# Patient Record
Sex: Male | Born: 1950 | Race: White | Hispanic: No | Smoking: Never smoker
Health system: Southern US, Community
[De-identification: ages and names within clinical notes are randomized; demographics above are authoritative.]

## PROBLEM LIST (undated history)

## (undated) DIAGNOSIS — I6529 Occlusion and stenosis of unspecified carotid artery: Secondary | ICD-10-CM

## (undated) DIAGNOSIS — C801 Malignant (primary) neoplasm, unspecified: Secondary | ICD-10-CM

## (undated) DIAGNOSIS — E785 Hyperlipidemia, unspecified: Secondary | ICD-10-CM

## (undated) DIAGNOSIS — C229 Malignant neoplasm of liver, not specified as primary or secondary: Secondary | ICD-10-CM

## (undated) DIAGNOSIS — M1990 Unspecified osteoarthritis, unspecified site: Secondary | ICD-10-CM

## (undated) DIAGNOSIS — K429 Umbilical hernia without obstruction or gangrene: Secondary | ICD-10-CM

## (undated) DIAGNOSIS — Z923 Personal history of irradiation: Secondary | ICD-10-CM

## (undated) DIAGNOSIS — I10 Essential (primary) hypertension: Secondary | ICD-10-CM

## (undated) DIAGNOSIS — E079 Disorder of thyroid, unspecified: Secondary | ICD-10-CM

## (undated) DIAGNOSIS — R011 Cardiac murmur, unspecified: Secondary | ICD-10-CM

## (undated) DIAGNOSIS — E039 Hypothyroidism, unspecified: Secondary | ICD-10-CM

## (undated) DIAGNOSIS — I639 Cerebral infarction, unspecified: Secondary | ICD-10-CM

## (undated) DIAGNOSIS — J45909 Unspecified asthma, uncomplicated: Secondary | ICD-10-CM

## (undated) HISTORY — DX: Hyperlipidemia, unspecified: E78.5

## (undated) HISTORY — PX: SHOULDER SURGERY: SHX246

## (undated) HISTORY — DX: Cardiac murmur, unspecified: R01.1

## (undated) HISTORY — DX: Unspecified asthma, uncomplicated: J45.909

## (undated) HISTORY — PX: COLONOSCOPY: SHX174

## (undated) HISTORY — DX: Occlusion and stenosis of unspecified carotid artery: I65.29

## (undated) HISTORY — DX: Malignant neoplasm of liver, not specified as primary or secondary: C22.9

## (undated) HISTORY — DX: Personal history of irradiation: Z92.3

## (undated) HISTORY — PX: HERNIA REPAIR: SHX51

## (undated) HISTORY — DX: Essential (primary) hypertension: I10

## (undated) HISTORY — PX: OTHER SURGICAL HISTORY: SHX169

---

## 2004-12-05 ENCOUNTER — Ambulatory Visit: Admission: RE | Admit: 2004-12-05 | Discharge: 2004-12-21 | Payer: Self-pay | Admitting: Radiation Oncology

## 2004-12-11 ENCOUNTER — Encounter: Admission: EM | Admit: 2004-12-11 | Discharge: 2004-12-11 | Payer: Self-pay | Admitting: Dentistry

## 2004-12-11 ENCOUNTER — Ambulatory Visit: Payer: Self-pay | Admitting: Dentistry

## 2004-12-13 ENCOUNTER — Ambulatory Visit: Payer: Self-pay | Admitting: Oncology

## 2004-12-20 ENCOUNTER — Ambulatory Visit (HOSPITAL_COMMUNITY): Admission: RE | Admit: 2004-12-20 | Discharge: 2004-12-20 | Payer: Self-pay | Admitting: Radiation Oncology

## 2004-12-25 ENCOUNTER — Ambulatory Visit: Admission: RE | Admit: 2004-12-25 | Discharge: 2005-03-20 | Payer: Self-pay | Admitting: Radiation Oncology

## 2005-01-29 ENCOUNTER — Ambulatory Visit: Payer: Self-pay | Admitting: Oncology

## 2005-02-08 DIAGNOSIS — Z923 Personal history of irradiation: Secondary | ICD-10-CM

## 2005-02-08 HISTORY — DX: Personal history of irradiation: Z92.3

## 2005-02-13 ENCOUNTER — Ambulatory Visit (HOSPITAL_COMMUNITY): Admission: RE | Admit: 2005-02-13 | Discharge: 2005-02-13 | Payer: Self-pay | Admitting: Radiation Oncology

## 2005-04-24 ENCOUNTER — Ambulatory Visit: Payer: Self-pay | Admitting: Oncology

## 2005-05-13 ENCOUNTER — Ambulatory Visit (HOSPITAL_COMMUNITY): Admission: RE | Admit: 2005-05-13 | Discharge: 2005-05-13 | Payer: Self-pay | Admitting: Oncology

## 2005-08-16 ENCOUNTER — Ambulatory Visit (HOSPITAL_COMMUNITY): Admission: RE | Admit: 2005-08-16 | Discharge: 2005-08-16 | Payer: Self-pay | Admitting: Radiation Oncology

## 2008-05-27 ENCOUNTER — Ambulatory Visit: Admission: RE | Admit: 2008-05-27 | Discharge: 2008-05-27 | Payer: Self-pay | Admitting: Radiation Oncology

## 2008-05-27 LAB — CBC WITH DIFFERENTIAL/PLATELET
BASO%: 0.5 % (ref 0.0–2.0)
Basophils Absolute: 0 10*3/uL (ref 0.0–0.1)
EOS%: 7.1 % — ABNORMAL HIGH (ref 0.0–7.0)
Eosinophils Absolute: 0.3 10*3/uL (ref 0.0–0.5)
HCT: 41.4 % (ref 38.4–49.9)
HGB: 14.5 g/dL (ref 13.0–17.1)
LYMPH%: 25.7 % (ref 14.0–49.0)
MCH: 32.2 pg (ref 27.2–33.4)
MCHC: 35 g/dL (ref 32.0–36.0)
MCV: 92 fL (ref 79.3–98.0)
MONO#: 0.3 10*3/uL (ref 0.1–0.9)
MONO%: 7.1 % (ref 0.0–14.0)
NEUT#: 2.5 10*3/uL (ref 1.5–6.5)
NEUT%: 59.6 % (ref 39.0–75.0)
Platelets: 168 10*3/uL (ref 140–400)
RBC: 4.5 10*6/uL (ref 4.20–5.82)
RDW: 13.3 % (ref 11.0–14.6)
WBC: 4.3 10*3/uL (ref 4.0–10.3)
lymph#: 1.1 10*3/uL (ref 0.9–3.3)

## 2008-05-27 LAB — COMPREHENSIVE METABOLIC PANEL
ALT: 23 U/L (ref 0–53)
AST: 22 U/L (ref 0–37)
Albumin: 4.7 g/dL (ref 3.5–5.2)
Alkaline Phosphatase: 61 U/L (ref 39–117)
BUN: 18 mg/dL (ref 6–23)
CO2: 25 mEq/L (ref 19–32)
Calcium: 9.4 mg/dL (ref 8.4–10.5)
Chloride: 104 mEq/L (ref 96–112)
Creatinine, Ser: 1.23 mg/dL (ref 0.40–1.50)
Glucose, Bld: 143 mg/dL — ABNORMAL HIGH (ref 70–99)
Potassium: 3.8 mEq/L (ref 3.5–5.3)
Sodium: 138 mEq/L (ref 135–145)
Total Bilirubin: 0.4 mg/dL (ref 0.3–1.2)
Total Protein: 7 g/dL (ref 6.0–8.3)

## 2008-05-27 LAB — TSH: TSH: 9.114 u[IU]/mL — ABNORMAL HIGH (ref 0.350–4.500)

## 2008-05-27 LAB — T4: T4, Total: 4.4 ug/dL — ABNORMAL LOW (ref 5.0–12.5)

## 2008-11-30 ENCOUNTER — Ambulatory Visit: Admission: RE | Admit: 2008-11-30 | Discharge: 2008-11-30 | Payer: Self-pay | Admitting: Radiation Oncology

## 2008-11-30 LAB — COMPREHENSIVE METABOLIC PANEL
ALT: 25 U/L (ref 0–53)
AST: 31 U/L (ref 0–37)
Albumin: 4.5 g/dL (ref 3.5–5.2)
Alkaline Phosphatase: 52 U/L (ref 39–117)
BUN: 19 mg/dL (ref 6–23)
CO2: 23 mEq/L (ref 19–32)
Calcium: 9.5 mg/dL (ref 8.4–10.5)
Chloride: 104 mEq/L (ref 96–112)
Creatinine, Ser: 1.21 mg/dL (ref 0.40–1.50)
Glucose, Bld: 86 mg/dL (ref 70–99)
Potassium: 4 mEq/L (ref 3.5–5.3)
Sodium: 138 mEq/L (ref 135–145)
Total Bilirubin: 0.5 mg/dL (ref 0.3–1.2)
Total Protein: 7 g/dL (ref 6.0–8.3)

## 2008-11-30 LAB — CBC WITH DIFFERENTIAL/PLATELET
BASO%: 0.8 % (ref 0.0–2.0)
Basophils Absolute: 0 10*3/uL (ref 0.0–0.1)
EOS%: 5.3 % (ref 0.0–7.0)
Eosinophils Absolute: 0.3 10*3/uL (ref 0.0–0.5)
HCT: 38.8 % (ref 38.4–49.9)
HGB: 13.7 g/dL (ref 13.0–17.1)
LYMPH%: 20.7 % (ref 14.0–49.0)
MCH: 32.8 pg (ref 27.2–33.4)
MCHC: 35.3 g/dL (ref 32.0–36.0)
MCV: 93.1 fL (ref 79.3–98.0)
MONO#: 0.6 10*3/uL (ref 0.1–0.9)
MONO%: 11.3 % (ref 0.0–14.0)
NEUT#: 3.2 10*3/uL (ref 1.5–6.5)
NEUT%: 61.9 % (ref 39.0–75.0)
Platelets: 161 10*3/uL (ref 140–400)
RBC: 4.17 10*6/uL — ABNORMAL LOW (ref 4.20–5.82)
RDW: 13.2 % (ref 11.0–14.6)
WBC: 5.1 10*3/uL (ref 4.0–10.3)
lymph#: 1.1 10*3/uL (ref 0.9–3.3)

## 2008-11-30 LAB — T4, FREE: Free T4: 0.71 ng/dL — ABNORMAL LOW (ref 0.80–1.80)

## 2008-11-30 LAB — TSH: TSH: 8.004 u[IU]/mL — ABNORMAL HIGH (ref 0.350–4.500)

## 2009-02-20 ENCOUNTER — Ambulatory Visit: Admission: RE | Admit: 2009-02-20 | Discharge: 2009-02-20 | Payer: Self-pay | Admitting: Radiation Oncology

## 2009-02-20 LAB — TSH: TSH: 1.57 u[IU]/mL (ref 0.350–4.500)

## 2009-12-04 ENCOUNTER — Ambulatory Visit: Admission: RE | Admit: 2009-12-04 | Discharge: 2009-12-04 | Payer: Self-pay | Admitting: Radiation Oncology

## 2009-12-04 LAB — CBC WITH DIFFERENTIAL/PLATELET
BASO%: 0.9 % (ref 0.0–2.0)
Basophils Absolute: 0 10*3/uL (ref 0.0–0.1)
EOS%: 7 % (ref 0.0–7.0)
Eosinophils Absolute: 0.3 10*3/uL (ref 0.0–0.5)
HCT: 43.2 % (ref 38.4–49.9)
HGB: 14.7 g/dL (ref 13.0–17.1)
LYMPH%: 26.5 % (ref 14.0–49.0)
MCH: 31.9 pg (ref 27.2–33.4)
MCHC: 34 g/dL (ref 32.0–36.0)
MCV: 93.9 fL (ref 79.3–98.0)
MONO#: 0.5 10*3/uL (ref 0.1–0.9)
MONO%: 11.5 % (ref 0.0–14.0)
NEUT#: 2.2 10*3/uL (ref 1.5–6.5)
NEUT%: 54.1 % (ref 39.0–75.0)
Platelets: 162 10*3/uL (ref 140–400)
RBC: 4.6 10*6/uL (ref 4.20–5.82)
RDW: 13 % (ref 11.0–14.6)
WBC: 4.2 10*3/uL (ref 4.0–10.3)
lymph#: 1.1 10*3/uL (ref 0.9–3.3)

## 2009-12-04 LAB — COMPREHENSIVE METABOLIC PANEL
ALT: 25 U/L (ref 0–53)
AST: 24 U/L (ref 0–37)
Albumin: 4.5 g/dL (ref 3.5–5.2)
Alkaline Phosphatase: 58 U/L (ref 39–117)
BUN: 11 mg/dL (ref 6–23)
CO2: 26 mEq/L (ref 19–32)
Calcium: 9.2 mg/dL (ref 8.4–10.5)
Chloride: 102 mEq/L (ref 96–112)
Creatinine, Ser: 1.04 mg/dL (ref 0.40–1.50)
Glucose, Bld: 104 mg/dL — ABNORMAL HIGH (ref 70–99)
Potassium: 4.3 mEq/L (ref 3.5–5.3)
Sodium: 137 mEq/L (ref 135–145)
Total Bilirubin: 0.5 mg/dL (ref 0.3–1.2)
Total Protein: 6.9 g/dL (ref 6.0–8.3)

## 2009-12-04 LAB — TSH: TSH: 2.481 u[IU]/mL (ref 0.350–4.500)

## 2010-03-31 ENCOUNTER — Encounter: Payer: Self-pay | Admitting: Radiation Oncology

## 2010-04-02 ENCOUNTER — Encounter: Payer: Self-pay | Admitting: Family Medicine

## 2010-06-04 ENCOUNTER — Ambulatory Visit: Payer: Self-pay | Attending: Radiation Oncology | Admitting: Radiation Oncology

## 2010-12-17 ENCOUNTER — Ambulatory Visit
Admission: RE | Admit: 2010-12-17 | Discharge: 2010-12-17 | Disposition: A | Discharge status: Home / Self Care | Payer: Medicare Other | Source: Ambulatory Visit | Attending: Radiation Oncology | Admitting: Radiation Oncology

## 2010-12-17 DIAGNOSIS — C100 Malignant neoplasm of vallecula: Secondary | ICD-10-CM | POA: Insufficient documentation

## 2010-12-17 DIAGNOSIS — C01 Malignant neoplasm of base of tongue: Secondary | ICD-10-CM | POA: Insufficient documentation

## 2010-12-17 LAB — COMPREHENSIVE METABOLIC PANEL
ALT: 25 U/L (ref 0–53)
AST: 26 U/L (ref 0–37)
Albumin: 4.5 g/dL (ref 3.5–5.2)
Alkaline Phosphatase: 62 U/L (ref 39–117)
BUN: 12 mg/dL (ref 6–23)
CO2: 22 mEq/L (ref 19–32)
Calcium: 9.4 mg/dL (ref 8.4–10.5)
Chloride: 103 mEq/L (ref 96–112)
Potassium: 4.5 mEq/L (ref 3.5–5.3)
Sodium: 135 mEq/L (ref 135–145)
Total Bilirubin: 0.3 mg/dL (ref 0.3–1.2)
Total Protein: 6.8 g/dL (ref 6.0–8.3)

## 2010-12-17 LAB — CBC WITH DIFFERENTIAL/PLATELET
BASO%: 0.7 % (ref 0.0–2.0)
Basophils Absolute: 0 10*3/uL (ref 0.0–0.1)
EOS%: 7.6 % — ABNORMAL HIGH (ref 0.0–7.0)
Eosinophils Absolute: 0.4 10*3/uL (ref 0.0–0.5)
HCT: 42.1 % (ref 38.4–49.9)
HGB: 14.6 g/dL (ref 13.0–17.1)
LYMPH%: 25.9 % (ref 14.0–49.0)
MCH: 32.3 pg (ref 27.2–33.4)
MCHC: 34.6 g/dL (ref 32.0–36.0)
MCV: 93.3 fL (ref 79.3–98.0)
MONO#: 0.6 10*3/uL (ref 0.1–0.9)
MONO%: 12.6 % (ref 0.0–14.0)
NEUT#: 2.6 10*3/uL (ref 1.5–6.5)
NEUT%: 53.2 % (ref 39.0–75.0)
Platelets: 166 10*3/uL (ref 140–400)
RBC: 4.51 10*6/uL (ref 4.20–5.82)
RDW: 13.4 % (ref 11.0–14.6)
WBC: 4.9 10*3/uL (ref 4.0–10.3)
lymph#: 1.3 10*3/uL (ref 0.9–3.3)

## 2010-12-17 LAB — TSH: TSH: 2.466 u[IU]/mL (ref 0.350–4.500)

## 2010-12-17 LAB — PSA: PSA: 0.84 ng/mL (ref <=4.00)

## 2011-04-03 ENCOUNTER — Other Ambulatory Visit: Payer: Self-pay | Admitting: Radiation Oncology

## 2011-04-03 MED ORDER — LEVOTHYROXINE SODIUM 75 MCG PO TABS: 75.0000 ug | ORAL_TABLET | Freq: Every day | ORAL | Status: DC

## 2011-06-17 ENCOUNTER — Ambulatory Visit: Payer: Medicare Other | Admitting: Radiation Oncology

## 2011-09-20 ENCOUNTER — Other Ambulatory Visit: Payer: Self-pay | Admitting: Radiation Oncology

## 2011-09-23 ENCOUNTER — Ambulatory Visit: Admission: RE | Admit: 2011-09-23 | Payer: Medicare Other | Source: Ambulatory Visit | Admitting: Radiation Oncology

## 2011-10-09 ENCOUNTER — Encounter: Payer: Self-pay | Admitting: Radiation Oncology

## 2011-10-09 ENCOUNTER — Ambulatory Visit
Admission: RE | Admit: 2011-10-09 | Discharge: 2011-10-09 | Disposition: A | Discharge status: Home / Self Care | Payer: Self-pay | Source: Ambulatory Visit | Attending: Radiation Oncology | Admitting: Radiation Oncology

## 2011-10-09 VITALS — BP 135/87 | HR 55 | Temp 98.0°F | Resp 18 | Wt 239.2 lb

## 2011-10-09 DIAGNOSIS — C01 Malignant neoplasm of base of tongue: Secondary | ICD-10-CM

## 2011-10-09 DIAGNOSIS — E039 Hypothyroidism, unspecified: Secondary | ICD-10-CM

## 2011-10-09 HISTORY — DX: Malignant (primary) neoplasm, unspecified: C80.1

## 2011-10-09 HISTORY — DX: Disorder of thyroid, unspecified: E07.9

## 2011-10-09 MED ORDER — LEVOTHYROXINE SODIUM 75 MCG PO TABS: 75.0000 ug | ORAL_TABLET | Freq: Every day | ORAL | Status: DC

## 2011-10-09 NOTE — Progress Notes (Signed)
  Radiation Oncology         [phone] ________________________________  Name: Michael Mosley MRN: [id number]  Date: 10/09/2011  DOB: [birth year]  Follow-Up Visit Note  CC: No primary provider on file.  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 The patient is yet to obtain a primary care physician in light of  insurance issues. He does say he will be eligible for Medicare coverage in a couple years.  He denies any new problems over the past few months. Patient continues on Synthroid  75 mcg daily for hypothyroidism. He has chronic xerostomia but does not seem to be bothered too much by this issue. He denies any tobacco use. The patient has very little alcohol intake at this time. He admits that alcohol tends to dry his mouth out so this is not enjoyable to him.  He continues to play a lot of golf. Patient denies any cough, chest pain or breathing issues.                         ALLERGIES:   has no known allergies.  Meds: Current Outpatient Prescriptions  Medication Sig Dispense Refill  . levothyroxine (SYNTHROID, LEVOTHROID) 75 MCG tablet Take 1 tablet (75 mcg total) by mouth daily.  90 tablet  1  . DISCONTD: levothyroxine (SYNTHROID, LEVOTHROID) 75 MCG tablet take 1 tablet by mouth once daily  90 tablet  0    Physical Findings: The patient is in no acute distress. Patient is alert and oriented.  weight is 239 lb 3.2 oz (108.5 kg). His oral temperature is 98 F (36.7 C). His blood pressure is 135/87 and his pulse is 55. His respiration is 18. .  No no palpable cervical subclavicular or axillary adenopathy. The lungs are clear to auscultation. The heart has a regular rhythm and rate. Examination of oral cavity reveals mucosa to be mildly dry. There are no mucosal lesions noted. The pharynx is clear. The patient  did proceed to undergo indirect mirror examination. There are no mucosal lesions noted in the base of tongue or vallecula area. The vocal cords move well on exam.  Lab Findings: Lab Results  Component Value Date   WBC 4.9 12/17/2010   HGB 14.6 12/17/2010   HCT 42.1 12/17/2010   MCV 93.3 12/17/2010   PLT 166 12/17/2010    @LASTCHEM @  Radiographic Findings: No results found.  Impression:  The patient is doing well without evidence of recurrence on exam today.  Plan:  Routine followup in radiation oncology in 6 months.  _____________________________________    Billie Lade, PhD, MD

## 2011-10-09 NOTE — Progress Notes (Signed)
HERE TODAY FOR FU OF BASE OF TONGUE.  HAS NO C/O TODAY, SAYS HIS TASTE , STILL HAS SOME DRY MOUTH, ESPECIALLY AT NIGHT.   HAS NOT BEEN FOLLOWED BY ENT, DOES NOT HAVE PRIMARY CARE.

## 2011-10-09 NOTE — Addendum Note (Signed)
Encounter addended by: Amanda Pea, RN on: 10/09/2011  6:07 PM<BR>     Documentation filed: Charges VN

## 2012-04-06 ENCOUNTER — Encounter: Payer: Self-pay | Admitting: Radiation Oncology

## 2012-04-06 ENCOUNTER — Telehealth: Payer: Self-pay | Admitting: Radiation Oncology

## 2012-04-06 ENCOUNTER — Ambulatory Visit: Payer: Self-pay | Admitting: Radiation Oncology

## 2012-04-06 ENCOUNTER — Ambulatory Visit
Admission: RE | Admit: 2012-04-06 | Discharge: 2012-04-06 | Disposition: A | Discharge status: Home / Self Care | Payer: Self-pay | Source: Ambulatory Visit | Attending: Radiation Oncology | Admitting: Radiation Oncology

## 2012-04-06 VITALS — BP 136/97 | HR 67 | Temp 98.0°F | Resp 18 | Wt 243.3 lb

## 2012-04-06 DIAGNOSIS — E039 Hypothyroidism, unspecified: Secondary | ICD-10-CM

## 2012-04-06 DIAGNOSIS — C01 Malignant neoplasm of base of tongue: Secondary | ICD-10-CM

## 2012-04-06 NOTE — Progress Notes (Signed)
Patient presented to the clinic today unaccompanied for follow up appointment with Dr. Roselind Messier. Patient is alert and oriented to person, place, and time. No distress noted. Steady gait noted. Pleasant affect noted. Patient denies pain at this time. Patient last saw [facility] around 05/2010. Patient continues to take levothyroxine. Patient reports that dry mouth continues. Patient reports difficulty sleeping. Patient reports he averages a couple of hours at a time before waking up. Patient denies shortness of breath. Patient reports a consistent dry cough x several years. Patient denies nausea, vomiting, headache, or dizziness. Reported all findings to Dr. Roselind Messier.

## 2012-04-06 NOTE — Telephone Encounter (Signed)
Met w patient to discuss RO billing. Pt has no insurance and has completed the EPP application at this time. Pt needs to bring back all eligible income qualifications in order for FA application to be completed and processed in a timely manner.  Pt was also given an MCD application, as well. He advised he had MCD before when he was on disability.    Attending Rad: JK

## 2012-04-06 NOTE — Progress Notes (Signed)
  Radiation Oncology         [phone] ________________________________  Name: Michael Mosley MRN: [id number]  Date: 04/06/2012  DOB: 05/08/1950  Follow-Up Visit Note  CC: No primary provider on file.  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  He denies any new medical issues. The patient is yet to obtain a primary care physician and as consequences will return here for followup of his base of tongue cancer in refill on his thyroid medication in light of his hypothyroidism. he has mild dryness in  his oral cavity but does have good taste. He denies any pain with swallowing or difficulty with swallowing. He denies any cough or breathing problems.                              ALLERGIES:   has no known allergies.  Meds: Current Outpatient Prescriptions  Medication Sig Dispense Refill  . levothyroxine (SYNTHROID, LEVOTHROID) 75 MCG tablet Take 1 tablet (75 mcg total) by mouth daily.  90 tablet  1  . influenza  inactive virus vaccine (AFLURIA PRESERVATIVE FREE) injection Inject 0.5 mLs into the muscle once. Given at [facility] [address] [place] [address]  in left upper arm.[employer] 01/10/12 lot number [id number] EXP. 09/07/2012        Physical Findings: The patient is in no acute distress. Patient is alert and oriented.  weight is 243 lb 4.8 oz (110.36 kg). His oral temperature is 98 F (36.7 C). His blood pressure is 136/97 and his pulse is 67. His respiration is 18. Marland Kitchen  No palpable cervical supraclavicular or axillary adenopathy. There is some mild induration along the anterior upper neck region. The lungs are clear. The heart has a regular rhythm and rate. Examination of the oral cavity reveals mucosa to be moist. There are no mucosal lesions noted. Patient proceeded to undergo indirect your  examination through the oral cavity. A good view of the base of tongue and vallecula area was obtained. There are no mucosal lesions noted. Vocal cords moved well on examination. Palpation along the oral cavity and base of tongue area reveals no suspicious induration.  Lab Findings: Lab Results  Component Value Date   WBC 4.9 12/17/2010   HGB 14.6 12/17/2010   HCT 42.1 12/17/2010   MCV 93.3 12/17/2010   PLT 166 12/17/2010         Impression:  No evidence of recurrence on clinical exam today  Plan:  Routine followup in one year for examination and TSH.  I have asked patient to have his blood pressure rechecked on a regular basis since this is mildly elevated today.  _____________________________________  -----------------------------------  Billie Lade, PhD, MD

## 2012-04-07 ENCOUNTER — Ambulatory Visit
Admission: RE | Admit: 2012-04-07 | Discharge: 2012-04-07 | Disposition: A | Discharge status: Home / Self Care | Payer: Self-pay | Source: Ambulatory Visit | Attending: Radiation Oncology | Admitting: Radiation Oncology

## 2012-04-07 ENCOUNTER — Telehealth: Payer: Self-pay | Admitting: Radiation Oncology

## 2012-04-07 DIAGNOSIS — E039 Hypothyroidism, unspecified: Secondary | ICD-10-CM

## 2012-04-07 LAB — TSH: TSH: 2.985 u[IU]/mL (ref 0.350–4.500)

## 2012-04-07 MED ORDER — LEVOTHYROXINE SODIUM 75 MCG PO TABS: 75.0000 ug | ORAL_TABLET | Freq: Every day | ORAL | Status: DC

## 2012-04-07 NOTE — Addendum Note (Signed)
Encounter addended by: Billie Lade, MD on: 04/07/2012  3:49 PM<BR>     Documentation filed: Orders

## 2012-04-07 NOTE — Telephone Encounter (Signed)
Message copied by Agnes Lawrence on Tue Apr 07, 2012  3:37 PM ------      Message from: Antony Blackbird D      Created: Tue Apr 07, 2012  3:24 PM       Sam, please inform pt. Of good results on TSH.            Thanks, jk      ----- Message -----         From: [facility] Interface         Sent: 04/07/2012   2:16 PM           To: Billie Lade, MD

## 2012-04-08 NOTE — Telephone Encounter (Signed)
Phoned patient as directed by Dr. Roselind Messier. Informed patient that TSH level is good and within normal limits. Instructed patient to continue taking levothyroxine at the same dose prescribed. Patient requested refill of levothyroxine. Notified Dr. Roselind Messier of need for refill. Refill order escribed by Dr. Roselind Messier. Phoned patient again informing him this action had taken place. Patient verbalized understanding and expressed appreciation for the call.

## 2012-05-13 ENCOUNTER — Telehealth: Payer: Self-pay | Admitting: Radiation Oncology

## 2012-05-13 NOTE — Telephone Encounter (Signed)
Pt EPP application dated 04/06/2012 is un-complete and denied at this time.   COPY SCANNED

## 2013-02-22 ENCOUNTER — Ambulatory Visit: Payer: Self-pay | Admitting: Family Medicine

## 2013-02-22 VITALS — BP 110/72 | HR 62 | Temp 98.3°F | Resp 18 | Ht 73.0 in | Wt 237.0 lb

## 2013-02-22 DIAGNOSIS — J9801 Acute bronchospasm: Secondary | ICD-10-CM

## 2013-02-22 DIAGNOSIS — J40 Bronchitis, not specified as acute or chronic: Secondary | ICD-10-CM

## 2013-02-22 DIAGNOSIS — J189 Pneumonia, unspecified organism: Secondary | ICD-10-CM

## 2013-02-22 DIAGNOSIS — R05 Cough: Secondary | ICD-10-CM

## 2013-02-22 DIAGNOSIS — R059 Cough, unspecified: Secondary | ICD-10-CM

## 2013-02-22 MED ORDER — ALBUTEROL SULFATE (2.5 MG/3ML) 0.083% IN NEBU
2.5000 mg | INHALATION_SOLUTION | Freq: Once | RESPIRATORY_TRACT | Status: AC
  Administered 2013-02-22: 2.5 mg via RESPIRATORY_TRACT

## 2013-02-22 MED ORDER — AZITHROMYCIN 250 MG PO TABS: ORAL_TABLET | ORAL | Status: DC

## 2013-02-22 MED ORDER — IPRATROPIUM BROMIDE 0.02 % IN SOLN
0.5000 mg | Freq: Once | RESPIRATORY_TRACT | Status: AC
  Administered 2013-02-22: 0.5 mg via RESPIRATORY_TRACT

## 2013-02-22 MED ORDER — ALBUTEROL SULFATE HFA 108 (90 BASE) MCG/ACT IN AERS: 1.0000 | INHALATION_SPRAY | RESPIRATORY_TRACT | Status: DC | PRN

## 2013-02-22 NOTE — Patient Instructions (Signed)
Start antibiotic today for possibly early community acquired Pneumonia versus Bronchitis.  Albuterol inhaler up to 2 puffs every 4 hours if needed.  Recheck in the next 2-3 days if not improving.  Return to clinic sooner if any worsening.  If you have to use the inhaler every four hours consistently in the next two days, return to clinic for reevaluation.  Return to the clinic or go to the nearest emergency room if any of your symptoms worsen or new symptoms occur.  Pneumonia, Adult Pneumonia is an infection of the lungs.  CAUSES Pneumonia may be caused by bacteria or a virus. Usually, these infections are caused by breathing infectious particles into the lungs (respiratory tract). SYMPTOMS   Cough.  Fever.  Chest pain.  Increased rate of breathing.  Wheezing.  Mucus production. DIAGNOSIS  If you have the common symptoms of pneumonia, your caregiver will typically confirm the diagnosis with a chest X-ray. The X-ray will show an abnormality in the lung (pulmonary infiltrate) if you have pneumonia. Other tests of your blood, urine, or sputum may be done to find the specific cause of your pneumonia. Your caregiver may also do tests (blood gases or pulse oximetry) to see how well your lungs are working. TREATMENT  Some forms of pneumonia may be spread to other people when you cough or sneeze. You may be asked to wear a mask before and during your exam. Pneumonia that is caused by bacteria is treated with antibiotic medicine. Pneumonia that is caused by the influenza virus may be treated with an antiviral medicine. Most other viral infections must run their course. These infections will not respond to antibiotics.  PREVENTION A pneumococcal shot (vaccine) is available to prevent a common bacterial cause of pneumonia. This is usually suggested for:  People over 73 years old.  Patients on chemotherapy.  People with chronic lung problems, such as bronchitis or emphysema.  People with immune  system problems. If you are over 65 or have a high risk condition, you may receive the pneumococcal vaccine if you have not received it before. In some countries, a routine influenza vaccine is also recommended. This vaccine can help prevent some cases of pneumonia.You may be offered the influenza vaccine as part of your care. If you smoke, it is time to quit. You may receive instructions on how to stop smoking. Your caregiver can provide medicines and counseling to help you quit. HOME CARE INSTRUCTIONS   Cough suppressants may be used if you are losing too much rest. However, coughing protects you by clearing your lungs. You should avoid using cough suppressants if you can.  Your caregiver may have prescribed medicine if he or she thinks your pneumonia is caused by a bacteria or influenza. Finish your medicine even if you start to feel better.  Your caregiver may also prescribe an expectorant. This loosens the mucus to be coughed up.  Only take over-the-counter or prescription medicines for pain, discomfort, or fever as directed by your caregiver.  Do not smoke. Smoking is a common cause of bronchitis and can contribute to pneumonia. If you are a smoker and continue to smoke, your cough may last several weeks after your pneumonia has cleared.  A cold steam vaporizer or humidifier in your room or home may help loosen mucus.  Coughing is often worse at night. Sleeping in a semi-upright position in a recliner or using a couple pillows under your head will help with this.  Get rest as you feel it is  needed. Your body will usually let you know when you need to rest. SEEK IMMEDIATE MEDICAL CARE IF:   Your illness becomes worse. This is especially true if you are elderly or weakened from any other disease.  You cannot control your cough with suppressants and are losing sleep.  You begin coughing up blood.  You develop pain which is getting worse or is uncontrolled with medicines.  You have a  fever.  Any of the symptoms which initially brought you in for treatment are getting worse rather than better.  You develop shortness of breath or chest pain. MAKE SURE YOU:   Understand these instructions.  Will watch your condition.  Will get help right away if you are not doing well or get worse. Document Released: 02/25/2005 Document Revised: 05/20/2011 Document Reviewed: 05/17/2010 [facility] Patient Information 2014 [place], [place].    Bronchospasm, Adult A bronchospasm is a spasm or tightening of the airways going into the lungs. During a bronchospasm breathing becomes more difficult because the airways get smaller. When this happens there can be coughing, a whistling sound when breathing (wheezing), and difficulty breathing. Bronchospasm is often associated with asthma, but not all patients who experience a bronchospasm have asthma. CAUSES  A bronchospasm is caused by inflammation or irritation of the airways. The inflammation or irritation may be triggered by:   Allergies (such as to animals, pollen, food, or mold). Allergens that cause bronchospasm may cause wheezing immediately after exposure or many hours later.   Infection. Viral infections are believed to be the most common cause of bronchospasm.   Exercise.   Irritants (such as pollution, cigarette smoke, strong odors, aerosol sprays, and paint fumes).   Weather changes. Winds increase molds and pollens in the air. Rain refreshes the air by washing irritants out. Cold air may cause inflammation.   Stress and emotional upset.  SIGNS AND SYMPTOMS   Wheezing.   Excessive nighttime coughing.   Frequent or severe coughing with a simple cold.   Chest tightness.   Shortness of breath.  DIAGNOSIS  Bronchospasm is usually diagnosed through a history and physical exam. Tests, such as chest X-rays, are sometimes done to look for other conditions. TREATMENT   Inhaled medicines can be given to open up your  airways and help you breathe. The medicines can be given using either an inhaler or a nebulizer machine.  Corticosteroid medicines may be given for severe bronchospasm, usually when it is associated with asthma. HOME CARE INSTRUCTIONS   Always have a plan prepared for seeking medical care. Know when to call your health care provider and local emergency services (911 in the [place] Know where you can access local emergency care.  Only take medicines as directed by your health care provider.  If you were prescribed an inhaler or nebulizer machine, ask your health care provider to explain how to use it correctly. Always use a spacer with your inhaler if you were given one.  It is necessary to remain calm during an attack. Try to relax and breathe more slowly.  Control your home environment in the following ways:   Change your heating and air conditioning filter at least once a month.   Limit your use of fireplaces and wood stoves.  Do not smoke and do not allow smoking in your home.   Avoid exposure to perfumes and fragrances.   Get rid of pests (such as roaches and mice) and their droppings.   Throw away plants if you see mold on them.  Keep your house clean and dust free.   Replace carpet with wood, tile, or vinyl flooring. Carpet can trap dander and dust.   Use allergy-proof pillows, mattress covers, and box spring covers.   Wash bed sheets and blankets every week in hot water and dry them in a dryer.   Use blankets that are made of polyester or cotton.   Wash hands frequently. SEEK MEDICAL CARE IF:   You have muscle aches.   You have chest pain.   The sputum changes from clear or white to yellow, green, gray, or bloody.   The sputum you cough up gets thicker.   There are problems that may be related to the medicine you are given, such as a rash, itching, swelling, or trouble breathing.  SEEK IMMEDIATE MEDICAL CARE IF:   You have worsening wheezing  and coughing even after taking your prescribed medicines.   You have increased difficulty breathing.   You develop severe chest pain. MAKE SURE YOU:   Understand these instructions.  Will watch your condition.  Will get help right away if you are not doing well or get worse. Document Released: 02/28/2003 Document Revised: 10/28/2012 Document Reviewed: 08/17/2012 [facility] Patient Information 2014 [place], [place]. Cough, Adult  A cough is a reflex that helps clear your throat and airways. It can help heal the body or may be a reaction to an irritated airway. A cough may only last 2 or 3 weeks (acute) or may last more than 8 weeks (chronic).  CAUSES Acute cough:  Viral or bacterial infections. Chronic cough:  Infections.  Allergies.  Asthma.  Post-nasal drip.  Smoking.  Heartburn or acid reflux.  Some medicines.  Chronic lung problems (COPD).  Cancer. SYMPTOMS   Cough.  Fever.  Chest pain.  Increased breathing rate.  High-pitched whistling sound when breathing (wheezing).  Colored mucus that you cough up (sputum). TREATMENT   A bacterial cough may be treated with antibiotic medicine.  A viral cough must run its course and will not respond to antibiotics.  Your caregiver may recommend other treatments if you have a chronic cough. HOME CARE INSTRUCTIONS   Only take over-the-counter or prescription medicines for pain, discomfort, or fever as directed by your caregiver. Use cough suppressants only as directed by your caregiver.  Use a cold steam vaporizer or humidifier in your bedroom or home to help loosen secretions.  Sleep in a semi-upright position if your cough is worse at night.  Rest as needed.  Stop smoking if you smoke. SEEK IMMEDIATE MEDICAL CARE IF:   You have pus in your sputum.  Your cough starts to worsen.  You cannot control your cough with suppressants and are losing sleep.  You begin coughing up blood.  You have difficulty  breathing.  You develop pain which is getting worse or is uncontrolled with medicine.  You have a fever. MAKE SURE YOU:   Understand these instructions.  Will watch your condition.  Will get help right away if you are not doing well or get worse. Document Released: 08/24/2010 Document Revised: 05/20/2011 Document Reviewed: 08/24/2010 [facility] Patient Information 2014 [place], [place].

## 2013-02-22 NOTE — Progress Notes (Signed)
Subjective:  This chart was scribed for Michael Staggers, MD by Carl Best, Medical Scribe. This patient was seen in Room 14 and the patient's care was started at 9:12 AM.   Patient ID: Michael Mosley, male    DOB: Apr 15, 1950, 62 y.o.   MRN: [id number]  HPI HPI Comments: Michael Mosley is a 61 y.o. male who presents to the [facility] complaining of sinusitis that started two weeks ago.  He states that he started experiencing nasal and cough congestion two days ago. The patient states that his mucous has been clear to white in color.  He lists wheezing and cough as associated symptoms.  He states that he has been wheezing since yesterday.  He denies fever and chest pain as associated symptoms.  He states that he has taken Mucinex and noticed that his symptoms migrated to his chest.  He states that he has not been sick since 2012 after he was given chemo radiation to treat his throat cancer.  He states that at that time he was given antibiotics which caused him to get thrush.  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 He denies smoking a history of smoking.  He denies having a history of Pneumonia, Asthma, and Emphysema.  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  He states that he works at [employer] intermittently.      Patient Active Problem List   Diagnosis Date Noted  . 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 Hypothyroidism 10/09/2011   Past Medical History  Diagnosis Date  . Thyroid disease   . Cancer   . Tongue cancer   . Heart murmur    History reviewed. No pertinent past surgical history. No Known Allergies Prior to Admission medications   Medication Sig Start Date End Date Taking? Authorizing Provider  Fexofenadine HCl (MUCINEX ALLERGY PO) Take by mouth.   Yes Historical Provider, MD  levothyroxine (SYNTHROID, LEVOTHROID) 75 MCG tablet Take 1 tablet (75 mcg total) by mouth daily.  04/07/12  Yes Billie Lade, MD  influenza  inactive virus vaccine (AFLURIA PRESERVATIVE FREE) injection Inject 0.5 mLs into the muscle once. Given at [facility] [address] [place]. [place] [id number]  in left upper arm.CSL biotherapies 01/10/12 lot number [id number] EXP. 09/07/2012 01/10/12   Historical Provider, MD   History   Social History  . Marital Status: Divorced    Spouse Name: N/A    Number of Children: N/A  . Years of Education: N/A   Occupational History  . Not on file.   Social History Main Topics  . Smoking status: Never Smoker   . Smokeless tobacco: Not on file  . Alcohol Use: Not on file  . Drug Use: Not on file  . Sexual Activity: Not on file   Other Topics Concern  . Not on file   Social History Narrative  . No narrative on file     Review of Systems  Constitutional: Negative for fever.  HENT: Positive for congestion.   Respiratory: Positive for cough, shortness of breath and wheezing.   Cardiovascular: Negative for chest pain.  All other systems reviewed and are negative.     Objective:  Physical Exam  Vitals reviewed. Constitutional: He is oriented to person, place, and time. He appears well-developed and well-nourished.  HENT:  Head: Normocephalic and atraumatic.  Right Ear: Tympanic membrane, external ear  and ear canal normal.  Left Ear: Tympanic membrane, external ear and ear canal normal.  Nose: No rhinorrhea. Right sinus exhibits no maxillary sinus tenderness and no frontal sinus tenderness. Left sinus exhibits no maxillary sinus tenderness and no frontal sinus tenderness.  Mouth/Throat: Oropharynx is clear and moist and mucous membranes are normal. No posterior oropharyngeal erythema.  No vesicles in posterior oropharynx but small amount of white discharge present.   Eyes: Conjunctivae are normal. Pupils are equal, round, and reactive to light.  Neck: Neck supple.  Cardiovascular: Normal rate, regular rhythm, normal heart sounds and intact  distal pulses.   No murmur heard. Pulmonary/Chest: Effort normal. He has wheezes (expiratory) in the right upper field, the right middle field, the right lower field, the left upper field, the left middle field and the left lower field. He has no rhonchi. He has no rales.  Few course breath sounds on the right side that are greater than on the left and somewhat obstructed by the wheeze.    Abdominal: Soft. There is no tenderness.  Lymphadenopathy:    He has no cervical adenopathy.  Neurological: He is alert and oriented to person, place, and time.  Skin: Skin is warm and dry. No rash noted.  Psychiatric: He has a normal mood and affect. His behavior is normal.    9:32 AM: Recheck evaluation after Albuterol inhaler and travel nebulizer.  Much improved aeration.  Faint end expiratory wheeze only with few coarse breath sounds in right lower node.     Assessment & Plan:   Michael Mosley is a 62 y.o. male Cough - Plan: albuterol (PROVENTIL) (2.5 MG/3ML) 0.083% nebulizer solution 2.5 mg, ipratropium (ATROVENT) nebulizer solution 0.5 mg  Bronchitis - Plan: albuterol (PROVENTIL) (2.5 MG/3ML) 0.083% nebulizer solution 2.5 mg, ipratropium (ATROVENT) nebulizer solution 0.5 mg  Bronchospasm - Plan: albuterol (PROVENTIL HFA;VENTOLIN HFA) 108 (90 BASE) MCG/ACT inhaler  CAP (community acquired pneumonia) - Plan: azithromycin (ZITHROMAX) 250 MG tablet  Initial sinus congestion, now with more cough, chest congestion and few coarse breathe sounds on RLL after neb treatment.  Bronchospasm, with possible early CAP vs bronchitis.  -start Zpak, albuterol Q4-6h prn, recehck in next 48-72 hours with possible XR if not improving - sooner or ER if worse as discussed below. (RTC precautions).   Meds ordered this encounter  Medications  . Fexofenadine HCl (MUCINEX ALLERGY PO)    Sig: Take by mouth.  Marland Kitchen albuterol (PROVENTIL) (2.5 MG/3ML) 0.083% nebulizer solution 2.5 mg    Sig:   . ipratropium (ATROVENT)  nebulizer solution 0.5 mg    Sig:   . azithromycin (ZITHROMAX) 250 MG tablet    Sig: Take 2 pills by mouth on day 1, then 1 pill by mouth per day on days 2 through 5.    Dispense:  6 each    Refill:  0  . albuterol (PROVENTIL HFA;VENTOLIN HFA) 108 (90 BASE) MCG/ACT inhaler    Sig: Inhale 1-2 puffs into the lungs every 4 (four) hours as needed for wheezing or shortness of breath.    Dispense:  1 Inhaler    Refill:  0   Patient Instructions  Start antibiotic today for possibly early community acquired Pneumonia versus Bronchitis.  Albuterol inhaler up to 2 puffs every 4 hours if needed.  Recheck in the next 2-3 days if not improving.  Return to clinic sooner if any worsening.  If you have to use the inhaler every four hours consistently in the next two days, return to  clinic for reevaluation.  Return to the clinic or go to the nearest emergency room if any of your symptoms worsen or new symptoms occur.  Pneumonia, Adult Pneumonia is an infection of the lungs.  CAUSES Pneumonia may be caused by bacteria or a virus. Usually, these infections are caused by breathing infectious particles into the lungs (respiratory tract). SYMPTOMS   Cough.  Fever.  Chest pain.  Increased rate of breathing.  Wheezing.  Mucus production. DIAGNOSIS  If you have the common symptoms of pneumonia, your caregiver will typically confirm the diagnosis with a chest X-ray. The X-ray will show an abnormality in the lung (pulmonary infiltrate) if you have pneumonia. Other tests of your blood, urine, or sputum may be done to find the specific cause of your pneumonia. Your caregiver may also do tests (blood gases or pulse oximetry) to see how well your lungs are working. TREATMENT  Some forms of pneumonia may be spread to other people when you cough or sneeze. You may be asked to wear a mask before and during your exam. Pneumonia that is caused by bacteria is treated with antibiotic medicine. Pneumonia that is caused  by the influenza virus may be treated with an antiviral medicine. Most other viral infections must run their course. These infections will not respond to antibiotics.  PREVENTION A pneumococcal shot (vaccine) is available to prevent a common bacterial cause of pneumonia. This is usually suggested for:  People over 21 years old.  Patients on chemotherapy.  People with chronic lung problems, such as bronchitis or emphysema.  People with immune system problems. If you are over 65 or have a high risk condition, you may receive the pneumococcal vaccine if you have not received it before. In some countries, a routine influenza vaccine is also recommended. This vaccine can help prevent some cases of pneumonia.You may be offered the influenza vaccine as part of your care. If you smoke, it is time to quit. You may receive instructions on how to stop smoking. Your caregiver can provide medicines and counseling to help you quit. HOME CARE INSTRUCTIONS   Cough suppressants may be used if you are losing too much rest. However, coughing protects you by clearing your lungs. You should avoid using cough suppressants if you can.  Your caregiver may have prescribed medicine if he or she thinks your pneumonia is caused by a bacteria or influenza. Finish your medicine even if you start to feel better.  Your caregiver may also prescribe an expectorant. This loosens the mucus to be coughed up.  Only take over-the-counter or prescription medicines for pain, discomfort, or fever as directed by your caregiver.  Do not smoke. Smoking is a common cause of bronchitis and can contribute to pneumonia. If you are a smoker and continue to smoke, your cough may last several weeks after your pneumonia has cleared.  A cold steam vaporizer or humidifier in your room or home may help loosen mucus.  Coughing is often worse at night. Sleeping in a semi-upright position in a recliner or using a couple pillows under your head  will help with this.  Get rest as you feel it is needed. Your body will usually let you know when you need to rest. SEEK IMMEDIATE MEDICAL CARE IF:   Your illness becomes worse. This is especially true if you are elderly or weakened from any other disease.  You cannot control your cough with suppressants and are losing sleep.  You begin coughing up blood.  You develop  pain which is getting worse or is uncontrolled with medicines.  You have a fever.  Any of the symptoms which initially brought you in for treatment are getting worse rather than better.  You develop shortness of breath or chest pain. MAKE SURE YOU:   Understand these instructions.  Will watch your condition.  Will get help right away if you are not doing well or get worse. Document Released: 02/25/2005 Document Revised: 05/20/2011 Document Reviewed: 05/17/2010 [facility] Patient Information 2014 [place], [place].    Bronchospasm, Adult A bronchospasm is a spasm or tightening of the airways going into the lungs. During a bronchospasm breathing becomes more difficult because the airways get smaller. When this happens there can be coughing, a whistling sound when breathing (wheezing), and difficulty breathing. Bronchospasm is often associated with asthma, but not all patients who experience a bronchospasm have asthma. CAUSES  A bronchospasm is caused by inflammation or irritation of the airways. The inflammation or irritation may be triggered by:   Allergies (such as to animals, pollen, food, or mold). Allergens that cause bronchospasm may cause wheezing immediately after exposure or many hours later.   Infection. Viral infections are believed to be the most common cause of bronchospasm.   Exercise.   Irritants (such as pollution, cigarette smoke, strong odors, aerosol sprays, and paint fumes).   Weather changes. Winds increase molds and pollens in the air. Rain refreshes the air by washing irritants out. Cold  air may cause inflammation.   Stress and emotional upset.  SIGNS AND SYMPTOMS   Wheezing.   Excessive nighttime coughing.   Frequent or severe coughing with a simple cold.   Chest tightness.   Shortness of breath.  DIAGNOSIS  Bronchospasm is usually diagnosed through a history and physical exam. Tests, such as chest X-rays, are sometimes done to look for other conditions. TREATMENT   Inhaled medicines can be given to open up your airways and help you breathe. The medicines can be given using either an inhaler or a nebulizer machine.  Corticosteroid medicines may be given for severe bronchospasm, usually when it is associated with asthma. HOME CARE INSTRUCTIONS   Always have a plan prepared for seeking medical care. Know when to call your health care provider and local emergency services (911 in the [place] Know where you can access local emergency care.  Only take medicines as directed by your health care provider.  If you were prescribed an inhaler or nebulizer machine, ask your health care provider to explain how to use it correctly. Always use a spacer with your inhaler if you were given one.  It is necessary to remain calm during an attack. Try to relax and breathe more slowly.  Control your home environment in the following ways:   Change your heating and air conditioning filter at least once a month.   Limit your use of fireplaces and wood stoves.  Do not smoke and do not allow smoking in your home.   Avoid exposure to perfumes and fragrances.   Get rid of pests (such as roaches and mice) and their droppings.   Throw away plants if you see mold on them.   Keep your house clean and dust free.   Replace carpet with wood, tile, or vinyl flooring. Carpet can trap dander and dust.   Use allergy-proof pillows, mattress covers, and box spring covers.   Wash bed sheets and blankets every week in hot water and dry them in a dryer.   Use blankets that  are made of polyester or cotton.   Wash hands frequently. SEEK MEDICAL CARE IF:   You have muscle aches.   You have chest pain.   The sputum changes from clear or white to yellow, green, gray, or bloody.   The sputum you cough up gets thicker.   There are problems that may be related to the medicine you are given, such as a rash, itching, swelling, or trouble breathing.  SEEK IMMEDIATE MEDICAL CARE IF:   You have worsening wheezing and coughing even after taking your prescribed medicines.   You have increased difficulty breathing.   You develop severe chest pain. MAKE SURE YOU:   Understand these instructions.  Will watch your condition.  Will get help right away if you are not doing well or get worse. Document Released: 02/28/2003 Document Revised: 10/28/2012 Document Reviewed: 08/17/2012 [facility] Patient Information 2014 [place], [place]. Cough, Adult  A cough is a reflex that helps clear your throat and airways. It can help heal the body or may be a reaction to an irritated airway. A cough may only last 2 or 3 weeks (acute) or may last more than 8 weeks (chronic).  CAUSES Acute cough:  Viral or bacterial infections. Chronic cough:  Infections.  Allergies.  Asthma.  Post-nasal drip.  Smoking.  Heartburn or acid reflux.  Some medicines.  Chronic lung problems (COPD).  Cancer. SYMPTOMS   Cough.  Fever.  Chest pain.  Increased breathing rate.  High-pitched whistling sound when breathing (wheezing).  Colored mucus that you cough up (sputum). TREATMENT   A bacterial cough may be treated with antibiotic medicine.  A viral cough must run its course and will not respond to antibiotics.  Your caregiver may recommend other treatments if you have a chronic cough. HOME CARE INSTRUCTIONS   Only take over-the-counter or prescription medicines for pain, discomfort, or fever as directed by your caregiver. Use cough suppressants only as directed by  your caregiver.  Use a cold steam vaporizer or humidifier in your bedroom or home to help loosen secretions.  Sleep in a semi-upright position if your cough is worse at night.  Rest as needed.  Stop smoking if you smoke. SEEK IMMEDIATE MEDICAL CARE IF:   You have pus in your sputum.  Your cough starts to worsen.  You cannot control your cough with suppressants and are losing sleep.  You begin coughing up blood.  You have difficulty breathing.  You develop pain which is getting worse or is uncontrolled with medicine.  You have a fever. MAKE SURE YOU:   Understand these instructions.  Will watch your condition.  Will get help right away if you are not doing well or get worse. Document Released: 08/24/2010 Document Revised: 05/20/2011 Document Reviewed: 08/24/2010 [facility] Patient Information 2014 [place], [place].      I personally performed the services described in this documentation, which was scribed in my presence. The recorded information has been reviewed and is accurate.

## 2013-04-07 ENCOUNTER — Encounter: Payer: Self-pay | Admitting: Oncology

## 2013-04-12 ENCOUNTER — Ambulatory Visit: Payer: Self-pay | Admitting: Radiation Oncology

## 2013-05-28 ENCOUNTER — Ambulatory Visit: Payer: Self-pay | Admitting: Family Medicine

## 2013-05-28 VITALS — BP 124/77 | HR 67 | Temp 97.9°F | Resp 16 | Ht 72.0 in | Wt 235.6 lb

## 2013-05-28 DIAGNOSIS — R0981 Nasal congestion: Secondary | ICD-10-CM

## 2013-05-28 DIAGNOSIS — J3489 Other specified disorders of nose and nasal sinuses: Secondary | ICD-10-CM

## 2013-05-28 DIAGNOSIS — J019 Acute sinusitis, unspecified: Secondary | ICD-10-CM

## 2013-05-28 MED ORDER — AMOXICILLIN-POT CLAVULANATE 875-125 MG PO TABS: 1.0000 | ORAL_TABLET | Freq: Two times a day (BID) | ORAL | Status: DC

## 2013-05-28 MED ORDER — PREDNISONE 20 MG PO TABS: ORAL_TABLET | ORAL | Status: DC

## 2013-05-28 NOTE — Progress Notes (Signed)
[facility] [address] [place] [address] [phone]  Date:  05/28/2013   Name:  Michael Mosley   DOB:  1950-12-28   MRN:  [id number]  PCP:  PROVIDER NOT IN SYSTEM    Chief Complaint: Sinus Problem   History of Present Illness:  Michael Mosley is a 63 y.o. very pleasant male patient who presents with the following:  He is here today with illness- he has noted sinus congestion.  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  This smoke really bothers his nose.   He was seen back in December with bronchitis/ bronchospasm/ CAP.  He was tx with albuterol and azithromycin. States his sinuses have been bothering him since then  He has not been sneezing.  He is blowing material from his nose- especially in the am. He has a lot of clear or white mucus. He notes some pressure and pain.  He has noted a subjective fever once only- otherwise no fever.   Right now his chest is ok.   No ST.  Ears are not painful, no GI symptoms.    History of throat cancer about 10 years ago- he is now clear.   He does not have DM   Patient Active Problem List   Diagnosis Date Noted  . 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 Hypothyroidism 10/09/2011    Past Medical History  Diagnosis Date  . Thyroid disease   . Cancer   . Tongue cancer   . Heart murmur   . History of radiation therapy 02/2005    CTV 6810 cGy, PTV 6000 cGy, ETV 227 cGy    History reviewed. No pertinent past surgical history.  History  Substance Use Topics  . Smoking status: Never Smoker   . Smokeless tobacco: Not on file  . Alcohol Use: Not on file    History reviewed. No pertinent family history.  No Known Allergies  Medication list has been reviewed and updated.  Current Outpatient Prescriptions on File Prior to Visit  Medication Sig Dispense Refill  . albuterol (PROVENTIL HFA;VENTOLIN HFA) 108 (90 BASE) MCG/ACT inhaler Inhale 1-2 puffs into the lungs every 4  (four) hours as needed for wheezing or shortness of breath.  1 Inhaler  0  . azithromycin (ZITHROMAX) 250 MG tablet Take 2 pills by mouth on day 1, then 1 pill by mouth per day on days 2 through 5.  6 each  0  . Fexofenadine HCl (MUCINEX ALLERGY PO) Take by mouth.      . influenza  inactive virus vaccine (AFLURIA PRESERVATIVE FREE) injection Inject 0.5 mLs into the muscle once. Given at [place] [id number]  in left upper arm.CSL biotherapies 01/10/12 lot number [id number] EXP. 09/07/2012      . levothyroxine (SYNTHROID, LEVOTHROID) 75 MCG tablet Take 1 tablet (75 mcg total) by mouth daily.  90 tablet  4   No current facility-administered medications on file prior to visit.    Review of Systems:  As per HPI- otherwise negative.   Physical Examination: Filed Vitals:   05/28/13 1727  BP: 124/77  Pulse: 67  Temp: 97.9 F (36.6 C)  Resp: 16   Filed Vitals:   05/28/13 1727  Height: 6' (1.829 m)  Weight: 235 lb 9.6 oz (106.867 kg)   Body mass index is 31.95 kg/(m^2). Ideal Body Weight: Weight in (lb) to have BMI = 25: 183.9  GEN:  WDWN, NAD, Non-toxic, A & O x 3, overweight, looks well HEENT: Atraumatic, Normocephalic. Neck supple. No masses, No LAD.  Bilateral TM wnl, oropharynx normal.  PEERL,EOMI.  Nasal caongestion Ears and Nose: No external deformity. CV: RRR, No M/G/R. No JVD. No thrill. No extra heart sounds. PULM: CTA B, no wheezes, crackles, rhonchi. No retractions. No resp. distress. No accessory muscle use. EXTR: No c/c/e NEURO Normal gait.  PSYCH: Normally interactive. Conversant. Not depressed or anxious appearing.  Calm demeanor.    Assessment and Plan: Sinusitis, acute - Plan: amoxicillin-clavulanate (AUGMENTIN) 875-125 MG per tablet  Sinus congestion - Plan: predniSONE (DELTASONE) 20 MG tablet  Treat for sinusitis with augmentin and prednisone.   Gave rx for amoxicillin to hold as well in case augmentin is too expensive- 500 2 twice a  day for 10 days on a paper rx He will let me know if not better soon- Sooner if worse.      Signed Lamar Blinks, MD

## 2013-05-28 NOTE — Patient Instructions (Signed)
We are going to treat you for a sinus infection.  Use the augmentin antibiotic, and the prednisone for 3 days.  Let me know if you are not better in the next few days- Sooner if worse.

## 2013-06-28 ENCOUNTER — Ambulatory Visit
Admission: RE | Admit: 2013-06-28 | Discharge: 2013-06-28 | Disposition: A | Discharge status: Home / Self Care | Payer: Self-pay | Source: Ambulatory Visit | Attending: Radiation Oncology | Admitting: Radiation Oncology

## 2013-06-28 ENCOUNTER — Encounter: Payer: Self-pay | Admitting: Radiation Oncology

## 2013-06-28 VITALS — BP 147/83 | HR 68 | Temp 98.1°F | Wt 238.7 lb

## 2013-06-28 DIAGNOSIS — C01 Malignant neoplasm of base of tongue: Secondary | ICD-10-CM

## 2013-06-28 DIAGNOSIS — E039 Hypothyroidism, unspecified: Secondary | ICD-10-CM

## 2013-06-28 NOTE — Progress Notes (Signed)
Patient for 8 year follow up completion of radiation to tongue.biggest concern is continued sinus congestion over the last 3 to 4 weeks and difficulty sleeping.Swallowing is fine.Appetite good.

## 2013-06-28 NOTE — Progress Notes (Signed)
  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  He continues to go to the urgent care for medical issues and does not have a regular primary care physician. I  discussed the importance of this issue particularly with his age.  Patient has had problems with sinus congestion. He was placed on 2 courses of antibiotics as well as decongestants with no improvement. Patient denies any epistaxis or pain in the sinus region. He is becoming quite frustrated with his symptoms not improving as this has been going on for at least 6-8 weeks.  He denies any blood tinged sputum or swallowing difficulties. He continues to have some xerostomia but does have a good taste.                              ALLERGIES:  has No Known Allergies.  Meds: Current Outpatient Prescriptions  Medication Sig Dispense Refill  . aspirin 81 MG tablet Take 81 mg by mouth daily.      Marland Kitchen Fexofenadine HCl (MUCINEX ALLERGY PO) Take by mouth.      . levothyroxine (SYNTHROID, LEVOTHROID) 75 MCG tablet Take 1 tablet (75 mcg total) by mouth daily.  90 tablet  4  . amoxicillin-clavulanate (AUGMENTIN) 875-125 MG per tablet Take 1 tablet by mouth 2 (two) times daily.  20 tablet  0   No current facility-administered medications for this encounter.    Physical Findings: The patient is in no acute distress. Patient is alert and oriented.  weight is 238 lb 11.2 oz (108.274 kg). His temperature is 98.1 F (36.7 C). His blood pressure is 147/83 and his pulse is 68. His oxygen saturation is 96%. .  Patient has a nasal quality to his voice. Examination of the axillary  supraclavicular and neck region reveals no palpable adenopathy. The lungs are clear to auscultation. The heart has regular rhythm and rate. Examination of oral cavity reveals no mucosal lesion. Patient has mild radiation changes noted in the soft palate and pharyngeal area.  Palpation along the base of tongue and pharyngeal area reveals no suspicious induration.  Indirect mirror examination  Patient proceeded to undergo a indirect mirror examination. A good view of the base of tongue and vallecula was obtained. No mucosal lesions are noted. The vocal cords moved well on examination.  Lab Findings: Lab Results  Component Value Date   WBC 4.9 12/17/2010   HGB 14.6 12/17/2010   HCT 42.1 12/17/2010   MCV 93.3 12/17/2010   PLT 166 12/17/2010     Radiographic Findings: No results found.  Impression: No evidence of recurrence on clinical exam today  Plan:  The patient will return in one year for followup and TSH blood test.  He will present to the lab later this week for TSH.  In light of his continued sinus congestion I have recommended he followup Dr. Radene Journey.  ____________________________________ Blair Promise, MD

## 2013-06-30 ENCOUNTER — Ambulatory Visit: Payer: Self-pay | Attending: Radiation Oncology

## 2013-07-05 ENCOUNTER — Other Ambulatory Visit: Payer: Self-pay | Admitting: Radiation Oncology

## 2013-07-05 ENCOUNTER — Telehealth: Payer: Self-pay | Admitting: Oncology

## 2013-07-05 ENCOUNTER — Ambulatory Visit
Admission: RE | Admit: 2013-07-05 | Discharge: 2013-07-05 | Disposition: A | Discharge status: Home / Self Care | Payer: Self-pay | Source: Ambulatory Visit | Attending: Radiation Oncology | Admitting: Radiation Oncology

## 2013-07-05 DIAGNOSIS — E039 Hypothyroidism, unspecified: Secondary | ICD-10-CM | POA: Insufficient documentation

## 2013-07-05 DIAGNOSIS — C01 Malignant neoplasm of base of tongue: Secondary | ICD-10-CM | POA: Insufficient documentation

## 2013-07-05 LAB — TSH CHCC: TSH: 1.587 m(IU)/L (ref 0.320–4.118)

## 2013-07-05 MED ORDER — LEVOTHYROXINE SODIUM 75 MCG PO TABS: 75.0000 ug | ORAL_TABLET | Freq: Every day | ORAL | Status: DC

## 2013-07-05 NOTE — Telephone Encounter (Signed)
Called [name] and leftt a message for him to call back at [phone].

## 2013-10-06 ENCOUNTER — Ambulatory Visit (INDEPENDENT_AMBULATORY_CARE_PROVIDER_SITE_OTHER): Payer: Self-pay

## 2013-10-06 ENCOUNTER — Ambulatory Visit (INDEPENDENT_AMBULATORY_CARE_PROVIDER_SITE_OTHER): Payer: Self-pay | Admitting: Family Medicine

## 2013-10-06 ENCOUNTER — Other Ambulatory Visit: Payer: Self-pay | Admitting: Family Medicine

## 2013-10-06 VITALS — BP 138/82 | HR 69 | Temp 97.5°F | Resp 18 | Ht 72.75 in | Wt 236.8 lb

## 2013-10-06 DIAGNOSIS — R05 Cough: Secondary | ICD-10-CM

## 2013-10-06 DIAGNOSIS — R0981 Nasal congestion: Secondary | ICD-10-CM

## 2013-10-06 DIAGNOSIS — J3489 Other specified disorders of nose and nasal sinuses: Secondary | ICD-10-CM

## 2013-10-06 DIAGNOSIS — J310 Chronic rhinitis: Secondary | ICD-10-CM

## 2013-10-06 DIAGNOSIS — R059 Cough, unspecified: Secondary | ICD-10-CM

## 2013-10-06 DIAGNOSIS — J3089 Other allergic rhinitis: Secondary | ICD-10-CM

## 2013-10-06 DIAGNOSIS — T485X5A Adverse effect of other anti-common-cold drugs, initial encounter: Secondary | ICD-10-CM

## 2013-10-06 DIAGNOSIS — J45909 Unspecified asthma, uncomplicated: Secondary | ICD-10-CM

## 2013-10-06 MED ORDER — PREDNISONE 20 MG PO TABS: ORAL_TABLET | ORAL | Status: DC

## 2013-10-06 MED ORDER — LEVALBUTEROL HCL 1.25 MG/0.5ML IN NEBU
1.2500 mg | INHALATION_SOLUTION | Freq: Once | RESPIRATORY_TRACT | Status: AC
  Administered 2013-10-06: 1.25 mg via RESPIRATORY_TRACT

## 2013-10-06 MED ORDER — FLUTICASONE PROPIONATE 50 MCG/ACT NA SUSP: NASAL | Status: DC

## 2013-10-06 MED ORDER — LEVALBUTEROL TARTRATE 45 MCG/ACT IN AERO: 1.0000 | INHALATION_SPRAY | Freq: Four times a day (QID) | RESPIRATORY_TRACT | Status: DC

## 2013-10-06 NOTE — Patient Instructions (Addendum)
Asthma Asthma is a recurring condition in which the airways tighten and narrow. Asthma can make it difficult to breathe. It can cause coughing, wheezing, and shortness of breath. Asthma episodes, also called asthma attacks, range from minor to life-threatening. Asthma cannot be cured, but medicines and lifestyle changes can help control it. CAUSES Asthma is believed to be caused by inherited (genetic) and environmental factors, but its exact cause is unknown. Asthma may be triggered by allergens, lung infections, or irritants in the air. Asthma triggers are different for each person. Common triggers include:   Animal dander.  Dust mites.  Cockroaches.  Pollen from trees or grass.  Mold.  Smoke.  Air pollutants such as dust, household cleaners, hair sprays, aerosol sprays, paint fumes, strong chemicals, or strong odors.  Cold air, weather changes, and winds (which increase molds and pollens in the air).  Strong emotional expressions such as crying or laughing hard.  Stress.  Certain medicines (such as aspirin) or types of drugs (such as beta-blockers).  Sulfites in foods and drinks. Foods and drinks that may contain sulfites include dried fruit, potato chips, and sparkling grape juice.  Infections or inflammatory conditions such as the flu, a cold, or an inflammation of the nasal membranes (rhinitis).  Gastroesophageal reflux disease (GERD).  Exercise or strenuous activity. SYMPTOMS Symptoms may occur immediately after asthma is triggered or many hours later. Symptoms include:  Wheezing.  Excessive nighttime or early morning coughing.  Frequent or severe coughing with a common cold.  Chest tightness.  Shortness of breath. DIAGNOSIS  The diagnosis of asthma is made by a review of your medical history and a physical exam. Tests may also be performed. These may include:  Lung function studies. These tests show how much air you breathe in and out.  Allergy  tests.  Imaging tests such as X-rays. TREATMENT  Asthma cannot be cured, but it can usually be controlled. Treatment involves identifying and avoiding your asthma triggers. It also involves medicines. There are 2 classes of medicine used for asthma treatment:   Controller medicines. These prevent asthma symptoms from occurring. They are usually taken every day.  Reliever or rescue medicines. These quickly relieve asthma symptoms. They are used as needed and provide short-term relief. Your health care provider will help you create an asthma action plan. An asthma action plan is a written plan for managing and treating your asthma attacks. It includes a list of your asthma triggers and how they may be avoided. It also includes information on when medicines should be taken and when their dosage should be changed. An action plan may also involve the use of a device called a peak flow meter. A peak flow meter measures how well the lungs are working. It helps you monitor your condition. HOME CARE INSTRUCTIONS   Take medicines only as directed by your health care provider. Speak with your health care provider if you have questions about how or when to take the medicines.  Use a peak flow meter as directed by your health care provider. Record and keep track of readings.  Understand and use the action plan to help minimize or stop an asthma attack without needing to seek medical care.  Control your home environment in the following ways to help prevent asthma attacks:  Do not smoke. Avoid being exposed to secondhand smoke.  Change your heating and air conditioning filter regularly.  Limit your use of fireplaces and wood stoves.  Get rid of pests (such as roaches and   mice) and their droppings.  Throw away plants if you see mold on them.  Clean your floors and dust regularly. Use unscented cleaning products.  Try to have someone else vacuum for you regularly. Stay out of rooms while they are  being vacuumed and for a short while afterward. If you vacuum, use a dust mask from a hardware store, a double-layered or microfilter vacuum cleaner bag, or a vacuum cleaner with a HEPA filter.  Replace carpet with wood, tile, or vinyl flooring. Carpet can trap dander and dust.  Use allergy-proof pillows, mattress covers, and box spring covers.  Wash bed sheets and blankets every week in hot water and dry them in a dryer.  Use blankets that are made of polyester or cotton.  Clean bathrooms and kitchens with bleach. If possible, have someone repaint the walls in these rooms with mold-resistant paint. Keep out of the rooms that are being cleaned and painted.  Wash hands frequently. SEEK MEDICAL CARE IF:   You have wheezing, shortness of breath, or a cough even if taking medicine to prevent attacks.  The colored mucus you cough up (sputum) is thicker than usual.  Your sputum changes from clear or white to yellow, green, gray, or bloody.  You have any problems that may be related to the medicines you are taking (such as a rash, itching, swelling, or trouble breathing).  You are using a reliever medicine more than 2-3 times per week.  Your peak flow is still at 50-79% of your personal best after following your action plan for 1 hour.  You have a fever. SEEK IMMEDIATE MEDICAL CARE IF:   You seem to be getting worse and are unresponsive to treatment during an asthma attack.  You are short of breath even at rest.  You get short of breath when doing very little physical activity.  You have difficulty eating, drinking, or talking due to asthma symptoms.  You develop chest pain.  You develop a fast heartbeat.  You have a bluish color to your lips or fingernails.  You are light-headed, dizzy, or faint.  Your peak flow is less than 50% of your personal best. MAKE SURE YOU:   Understand these instructions.  Will watch your condition.  Will get help right away if you are not  doing well or get worse. Document Released: 02/25/2005 Document Revised: 07/12/2013 Document Reviewed: 09/24/2012 [facility] Patient Information 2015 [place], [place]. This information is not intended to replace advice given to you by your health care provider. Make sure you discuss any questions you have with your health care provider.  Use the xophenex inhaler 2 inhalations 4 times daily for lungs  Use the fluticasone nose spray 2 sprays each nostril twice daily for 4 days, then once daily  Take the prednisone 3 daily for 2 days, then daily for 2 days, then one daily for 2 days after breakfast  Return if not dramatically improved in one week, return sooner if worse at anytime or go to the emergency room  Return in one month for recheck of lungs and reassessment of the peak flow.  Stop the over-the-counter nose spray

## 2013-10-06 NOTE — Progress Notes (Signed)
Subjective: Patient is here with a 3 month history of nasal congestion. He has been using an OTC nose spray daily. He has developed shortness of breath the last 2 weeks with wheezing. He gets activity playing golf and with his work. Does not smoke. Mucus from nose and lungs are clear. Albuterol causes heart to race.  Objective: Afebrile. Throat clear. Nose very stuffy. TMs normal. Neck supple without nodes. Chest has diffuse wheezes.Marland Kitchen Heart regular without murmurs. Abdomen soft without mass or tenderness.  Peak flow was 350 at best. After nebulizer of Xopenex he had a peak flow of almost for 50.  [facility] reading (PRIMARY) by  Dr. Linna Darner Normal cxr Sinuses normal  Assessment: Asthma Rhinitis medicamentosa   Plan: See instructions Steroid Inhalers

## 2014-05-26 ENCOUNTER — Other Ambulatory Visit: Payer: Self-pay

## 2014-05-26 NOTE — Telephone Encounter (Signed)
Pt stopped in and states that his insurance company -[employer] wants him to try a different inhaler. They want him to try [facility], They sent him a letter requesting this. He is about out only has enough for today. He can be reached @ [phone]. He uses the [facility] on [address]. Thank you

## 2014-05-27 MED ORDER — ALBUTEROL SULFATE HFA 108 (90 BASE) MCG/ACT IN AERS: 2.0000 | INHALATION_SPRAY | Freq: Four times a day (QID) | RESPIRATORY_TRACT | Status: DC | PRN

## 2014-05-27 NOTE — Telephone Encounter (Signed)
Can someone else review this in Dr Hopper's absence. Since pt was using Xopenex instead of just another albuterol only inhaler, I can not change it without approval. Pended, but looks like pt is overdue for f/up.

## 2014-05-27 NOTE — Telephone Encounter (Signed)
Will refill in Dr. Clayborn Heron absence.  Patient should come back to clinic for reevaluation of primary condition.

## 2014-05-31 NOTE — Telephone Encounter (Signed)
Advised pt that the Rx was sent and also that he should get back in for f/up. Pt agreed and wanted to see the Dr for other questions as well. Transferred him to appt center to check on available appts.

## 2014-06-27 ENCOUNTER — Ambulatory Visit: Payer: Self-pay | Admitting: Radiation Oncology

## 2014-08-03 ENCOUNTER — Ambulatory Visit (INDEPENDENT_AMBULATORY_CARE_PROVIDER_SITE_OTHER): Payer: No Typology Code available for payment source | Admitting: Family Medicine

## 2014-08-03 ENCOUNTER — Ambulatory Visit (INDEPENDENT_AMBULATORY_CARE_PROVIDER_SITE_OTHER): Payer: No Typology Code available for payment source

## 2014-08-03 VITALS — BP 108/68 | HR 68 | Temp 97.8°F | Resp 18 | Ht 72.0 in | Wt 238.0 lb

## 2014-08-03 DIAGNOSIS — R0602 Shortness of breath: Secondary | ICD-10-CM

## 2014-08-03 DIAGNOSIS — E039 Hypothyroidism, unspecified: Secondary | ICD-10-CM | POA: Diagnosis not present

## 2014-08-03 DIAGNOSIS — Z85819 Personal history of malignant neoplasm of unspecified site of lip, oral cavity, and pharynx: Secondary | ICD-10-CM

## 2014-08-03 DIAGNOSIS — Z85818 Personal history of malignant neoplasm of other sites of lip, oral cavity, and pharynx: Secondary | ICD-10-CM | POA: Diagnosis not present

## 2014-08-03 DIAGNOSIS — J4530 Mild persistent asthma, uncomplicated: Secondary | ICD-10-CM

## 2014-08-03 LAB — POCT CBC
Granulocyte percent: 68.1 %G (ref 37–80)
HCT, POC: 43.5 % (ref 43.5–53.7)
Hemoglobin: 14.4 g/dL (ref 14.1–18.1)
Lymph, poc: 1.8 (ref 0.6–3.4)
MCH, POC: 30.4 pg (ref 27–31.2)
MCHC: 33.2 g/dL (ref 31.8–35.4)
MCV: 91.5 fL (ref 80–97)
MID (cbc): 0.8 (ref 0–0.9)
MPV: 7.8 fL (ref 0–99.8)
POC Granulocyte: 5.5 (ref 2–6.9)
POC LYMPH PERCENT: 22.3 % (ref 10–50)
POC MID %: 9.6 % (ref 0–12)
Platelet Count, POC: 206 10*3/uL (ref 142–424)
RBC: 4.75 M/uL (ref 4.69–6.13)
RDW, POC: 13.9 %
WBC: 8.1 10*3/uL (ref 4.6–10.2)

## 2014-08-03 LAB — TSH: TSH: 1.601 u[IU]/mL (ref 0.350–4.500)

## 2014-08-03 MED ORDER — LEVALBUTEROL HCL 0.63 MG/3ML IN NEBU
0.6300 mg | INHALATION_SOLUTION | Freq: Once | RESPIRATORY_TRACT | Status: AC
  Administered 2014-08-03: 0.63 mg via RESPIRATORY_TRACT

## 2014-08-03 MED ORDER — IPRATROPIUM BROMIDE 0.02 % IN SOLN
0.5000 mg | Freq: Once | RESPIRATORY_TRACT | Status: AC
  Administered 2014-08-03: 0.5 mg via RESPIRATORY_TRACT

## 2014-08-03 MED ORDER — LEVALBUTEROL TARTRATE 45 MCG/ACT IN AERO: 1.0000 | INHALATION_SPRAY | Freq: Four times a day (QID) | RESPIRATORY_TRACT | Status: DC

## 2014-08-03 NOTE — Patient Instructions (Addendum)
Shortness of Breath Shortness of breath means you have trouble breathing. It could also mean that you have a medical problem. You should get immediate medical care for shortness of breath. CAUSES   Not enough oxygen in the air such as with high altitudes or a smoke-filled room.  Certain lung diseases, infections, or problems.  Heart disease or conditions, such as angina or heart failure.  Low red blood cells (anemia).  Poor physical fitness, which can cause shortness of breath when you exercise.  Chest or back injuries or stiffness.  Being overweight.  Smoking.  Anxiety, which can make you feel like you are not getting enough air. DIAGNOSIS  Serious medical problems can often be found during your physical exam. Tests may also be done to determine why you are having shortness of breath. Tests may include:  Chest X-rays.  Lung function tests.  Blood tests.  An electrocardiogram (ECG).  An ambulatory electrocardiogram. An ambulatory ECG records your heartbeat patterns over a 24-hour period.  Exercise testing.  A transthoracic echocardiogram (TTE). During echocardiography, sound waves are used to evaluate how blood flows through your heart.  A transesophageal echocardiogram (TEE).  Imaging scans. Your health care provider may not be able to find a cause for your shortness of breath after your exam. In this case, it is important to have a follow-up exam with your health care provider as directed.  TREATMENT  Treatment for shortness of breath depends on the cause of your symptoms and can vary greatly. HOME CARE INSTRUCTIONS   Do not smoke. Smoking is a common cause of shortness of breath. If you smoke, ask for help to quit.  Avoid being around chemicals or things that may bother your breathing, such as paint fumes and dust.  Rest as needed. Slowly resume your usual activities.  If medicines were prescribed, take them as directed for the full length of time directed. This  includes oxygen and any inhaled medicines.  Keep all follow-up appointments as directed by your health care provider. SEEK MEDICAL CARE IF:   Your condition does not improve in the time expected.  You have a hard time doing your normal activities even with rest.  You have any new symptoms. SEEK IMMEDIATE MEDICAL CARE IF:   Your shortness of breath gets worse.  You feel light-headed, faint, or develop a cough not controlled with medicines.  You start coughing up blood.  You have pain with breathing.  You have chest pain or pain in your arms, shoulders, or abdomen.  You have a fever.  You are unable to walk up stairs or exercise the way you normally do. MAKE SURE YOU:  Understand these instructions.  Will watch your condition.  Will get help right away if you are not doing well or get worse. Document Released: 11/20/2000 Document Revised: 03/02/2013 Document Reviewed: 05/13/2011 [facility] Patient Information 2015 [place], [place]. This information is not intended to replace advice given to you by your health care provider. Make sure you discuss any questions you have with your health care provider.  Bronchospasm A bronchospasm is when the tubes that carry air in and out of your lungs (airways) spasm or tighten. During a bronchospasm it is hard to breathe. This is because the airways get smaller. A bronchospasm can be triggered by:  Allergies. These may be to animals, pollen, food, or mold.  Infection. This is a common cause of bronchospasm.  Exercise.  Irritants. These include pollution, cigarette smoke, strong odors, aerosol sprays, and paint fumes.  Weather changes.  Stress.  Being emotional. HOME CARE   Always have a plan for getting help. Know when to call your doctor and local emergency services (911 in the [place] Know where you can get emergency care.  Only take medicines as told by your doctor.  If you were prescribed an inhaler or nebulizer machine, ask  your doctor how to use it correctly. Always use a spacer with your inhaler if you were given one.  Stay calm during an attack. Try to relax and breathe more slowly.  Control your home environment:  Change your heating and air conditioning filter at least once a month.  Limit your use of fireplaces and wood stoves.  Do not  smoke. Do not  allow smoking in your home.  Avoid perfumes and fragrances.  Get rid of pests (such as roaches and mice) and their droppings.  Throw away plants if you see mold on them.  Keep your house clean and dust free.  Replace carpet with wood, tile, or vinyl flooring. Carpet can trap dander and dust.  Use allergy-proof pillows, mattress covers, and box spring covers.  Wash bed sheets and blankets every week in hot water. Dry them in a dryer.  Use blankets that are made of polyester or cotton.  Wash hands frequently. GET HELP IF:  You have muscle aches.  You have chest pain.  The thick spit you spit or cough up (sputum) changes from clear or white to yellow, green, gray, or bloody.  The thick spit you spit or cough up gets thicker.  There are problems that may be related to the medicine you are given such as:  A rash.  Itching.  Swelling.  Trouble breathing. GET HELP RIGHT AWAY IF:  You feel you cannot breathe or catch your breath.  You cannot stop coughing.  Your treatment is not helping you breathe better.  You have very bad chest pain. MAKE SURE YOU:   Understand these instructions.  Will watch your condition.  Will get help right away if you are not doing well or get worse. Document Released: 12/23/2008 Document Revised: 03/02/2013 Document Reviewed: 08/18/2012 [facility] Patient Information 2015 [place], [place]. This information is not intended to replace advice given to you by your health care provider. Make sure you discuss any questions you have with your health care provider.

## 2014-08-03 NOTE — Progress Notes (Signed)
 Chief Complaint:  Chief Complaint  Patient presents with  . Medication Refill    inhaler    HPI: Michael Mosley is a 64 y.o. male who is here for refill of his inhaler medication. He also states that he has had increased shortness of breath and has had to use his inhaler one puff 3 times a day if he's exerting himself and twice a day if he is sedentary. He doesn't know what triggers the shortness of breath since he is always active and outside. He denies allergies or asthma growing up. He does not have any seasonal allergies that he knows of however he has had issues with sinuses and congestion. Xopenex inhaler helps a lot. It gives him about 5-6 hours of relief. He denies any chest pain, pedal edema, orthopnea, dizziness or palpitations. Eyes any recent fevers or chills or unintentional weight loss..HE denies CHF sxs. HE states his health went down hill when he turned 33. He is active, drummer.   Wt Readings from Last 3 Encounters:  08/03/14 238 lb (107.956 kg)  10/06/13 236 lb 12.8 oz (107.412 kg)  05/28/13 235 lb 9.6 oz (106.867 kg)   He has a history of tongue cancer status post radiation and chemotherapy. Is followed by Dr. Sondra Come.  Past Medical History  Diagnosis Date  . Thyroid disease   . Heart murmur   . History of radiation therapy 02/2005    CTV 6810 cGy, PTV 6000 cGy, ETV 227 cGy  . Asthma   . Cancer     tongue cancer s/p chemo/RXT, followed by Dr Sondra Come.    History reviewed. No pertinent past surgical history. History   Social History  . Marital Status: Divorced    Spouse Name: N/A  . Number of Children: N/A  . Years of Education: N/A   Social History Main Topics  . Smoking status: Never Smoker   . Smokeless tobacco: Not on file  . Alcohol Use: Not on file  . Drug Use: Not on file  . Sexual Activity: Not on file   Other Topics Concern  . None   Social History Narrative   History reviewed. No pertinent family history. Allergies  Allergen  Reactions  . Albuterol Sulfate Palpitations   Prior to Admission medications   Medication Sig Start Date End Date Taking? Authorizing Provider  albuterol (PROVENTIL HFA;VENTOLIN HFA) 108 (90 BASE) MCG/ACT inhaler Inhale 2 puffs into the lungs every 6 (six) hours as needed. PATIENT NEEDS OFFICE VISIT FOR ADDITIONAL REFILLS 05/27/14  Yes Tereasa Coop, PA-C  aspirin 81 MG tablet Take 81 mg by mouth daily.   Yes Historical Provider, MD  fluticasone (FLONASE) 50 MCG/ACT nasal spray Use 2 sprays each nostril twice daily for 4 days, then daily 10/06/13  Yes Posey Boyer, MD  levothyroxine (SYNTHROID, LEVOTHROID) 75 MCG tablet Take 1 tablet (75 mcg total) by mouth daily. 07/05/13  Yes Gery Pray, MD  levalbuterol [facility] HFA) 45 MCG/ACT inhaler Inhale 1-2 puffs into the lungs 4 (four) times daily. Patient not taking: Reported on 08/03/2014 10/06/13   Posey Boyer, MD  predniSONE (DELTASONE) 20 MG tablet Take 3 pills daily for 2 days, 2 for 2 days, 1 for 2 days for lungs and nose Patient not taking: Reported on 08/03/2014 10/06/13   Posey Boyer, MD     ROS: The patient denies fevers, chills, night sweats, unintentional weight loss, chest pain, palpitations,, nausea, vomiting, abdominal pain, dysuria, hematuria, melena, numbness, weakness,  or tingling.   All other systems have been reviewed and were otherwise negative with the exception of those mentioned in the HPI and as above.    PHYSICAL EXAM: Filed Vitals:   08/03/14 1614  BP: 108/68  Pulse: 68  Temp: 97.8 F (36.6 C)  Resp: 18   SpO2 Readings from Last 3 Encounters:  08/03/14 96%  10/06/13 95%  05/28/13 95%    Filed Vitals:   08/03/14 1614  Height: 6' (1.829 m)  Weight: 238 lb (107.956 kg)   Body mass index is 32.27 kg/(m^2).  General: Alert, no acute distress HEENT:  Normocephalic, atraumatic, oropharynx patent. EOMI, PERRLA Cardiovascular:  Regular rate and rhythm, no rubs murmurs or gallops.  No Carotid bruits, radial  pulse intact. No pedal edema.  Respiratory: Clear to auscultation bilaterally.  + wheezes, No rales, or rhonchi.  No cyanosis, no use of accessory musculature GI: No organomegaly, abdomen is soft and non-tender, positive bowel sounds.  No masses. Skin: No rashes. Neurologic: Facial musculature symmetric. Psychiatric: Patient is appropriate throughout our interaction. Lymphatic: No cervical lymphadenopathy Musculoskeletal: Gait intact.   LABS: Results for orders placed or performed in visit on 08/03/14  POCT CBC  Result Value Ref Range   WBC 8.1 4.6 - 10.2 K/uL   Lymph, poc 1.8 0.6 - 3.4   POC LYMPH PERCENT 22.3 10 - 50 %L   MID (cbc) 0.8 0 - 0.9   POC MID % 9.6 0 - 12 %M   POC Granulocyte 5.5 2 - 6.9   Granulocyte percent 68.1 37 - 80 %G   RBC 4.75 4.69 - 6.13 M/uL   Hemoglobin 14.4 14.1 - 18.1 g/dL   HCT, POC 43.5 43.5 - 53.7 %   MCV 91.5 80 - 97 fL   MCH, POC 30.4 27 - 31.2 pg   MCHC 33.2 31.8 - 35.4 g/dL   RDW, POC 13.9 %   Platelet Count, POC 206 142 - 424 K/uL   MPV 7.8 0 - 99.8 fL     EKG/XRAY:   Primary read interpreted by Dr. Marin Comment at [facility]. Please comment if there is any worrisome nodule in the lateral view of the chest x-ray, there is some increased vascular markings on the right versus scarring. Patient has history of tongue cancer.   ASSESSMENT/PLAN: Encounter Diagnoses  Name Primary?  . Shortness of breath Yes  . History of throat cancer   . Hypothyroidism, unspecified hypothyroidism type   . Asthma, chronic, mild persistent, uncomplicated   . 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 He is using his Xopenex inhaler 3 times a day to twice a day when necessary but it has been really scheduled for the last few months. It helps him with his shortness of breath and wheezing for  about 5-6 hours. He denies any cardiac disease. He has had a stress test with Dr. Gaynelle Arabian last year and everything supposedly was normal. Pre and post about 350-375, below predicted for age and height. Improved BS , no wheezing after neb treatment of xopenex and atrovent Go ahead and get results from that stress test from Dr. Irven Shelling office We will refer him to pulmonology Refills of medication Gave him a trial sample of dulera 100 g/5 g inhaler 1 puff twice daily. Labs pending   Gross sideeffects, risk  and benefits, and alternatives of medications d/w patient. Patient is aware that all medications have potential sideeffects and we are unable to predict every sideeffect or drug-drug interaction that may occur.  , [place], DO 08/03/2014 6:00 PM

## 2014-08-05 ENCOUNTER — Telehealth: Payer: Self-pay

## 2014-08-05 NOTE — Telephone Encounter (Signed)
-----   Message from Glenford Bayley, DO sent at 08/05/2014  1:00 PM EDT ----- Regarding: Dulera 100 mcg inhlaer sample Patient left the dulera sample I gave him in one of the rooms, can you find the sample and give it to him. I had written instructions on it  1 puff  BID for him. IT is Dulera 100 mcg. HE is supposed to come after work today at 3 pm.   IF you cannot find it then please give him a call and tell him we will call in a rx, and you can do it for ome .  Dulera 100 mcg 1 puff BID, no refills.   Thanks  Dr Marin Comment

## 2014-08-05 NOTE — Telephone Encounter (Signed)
I checked all over the clinic and could not find the sample. Since it was after 3 already, I called down to clerical TL and asked if pt had come by looking for it. I was advised that the pt reported that he found the sample in his truck so he doesn't need a Rx called in now. Dr Marin Comment, Juluis Rainier.

## 2014-08-09 ENCOUNTER — Other Ambulatory Visit: Payer: Self-pay | Admitting: Physician Assistant

## 2014-08-16 ENCOUNTER — Ambulatory Visit (INDEPENDENT_AMBULATORY_CARE_PROVIDER_SITE_OTHER): Payer: No Typology Code available for payment source | Admitting: Internal Medicine

## 2014-08-16 ENCOUNTER — Other Ambulatory Visit (INDEPENDENT_AMBULATORY_CARE_PROVIDER_SITE_OTHER): Payer: No Typology Code available for payment source

## 2014-08-16 ENCOUNTER — Encounter: Payer: Self-pay | Admitting: Internal Medicine

## 2014-08-16 VITALS — BP 122/82 | HR 59 | Ht 73.0 in | Wt 245.0 lb

## 2014-08-16 DIAGNOSIS — J4530 Mild persistent asthma, uncomplicated: Secondary | ICD-10-CM | POA: Insufficient documentation

## 2014-08-16 DIAGNOSIS — C01 Malignant neoplasm of base of tongue: Secondary | ICD-10-CM

## 2014-08-16 LAB — CBC WITH DIFFERENTIAL/PLATELET
Basophils Absolute: 0 10*3/uL (ref 0.0–0.1)
Basophils Relative: 0.6 % (ref 0.0–3.0)
Eosinophils Absolute: 0.8 10*3/uL — ABNORMAL HIGH (ref 0.0–0.7)
Eosinophils Relative: 12.9 % — ABNORMAL HIGH (ref 0.0–5.0)
HCT: 42.3 % (ref 39.0–52.0)
Hemoglobin: 14.5 g/dL (ref 13.0–17.0)
Lymphocytes Relative: 30.7 % (ref 12.0–46.0)
Lymphs Abs: 1.8 10*3/uL (ref 0.7–4.0)
MCHC: 34.2 g/dL (ref 30.0–36.0)
MCV: 91 fl (ref 78.0–100.0)
Monocytes Absolute: 0.6 10*3/uL (ref 0.1–1.0)
Monocytes Relative: 10.7 % (ref 3.0–12.0)
NEUTROS PCT: 45.1 % (ref 43.0–77.0)
Neutro Abs: 2.7 10*3/uL (ref 1.4–7.7)
Platelets: 177 10*3/uL (ref 150.0–400.0)
RBC: 4.65 Mil/uL (ref 4.22–5.81)
RDW: 14.3 % (ref 11.5–15.5)
WBC: 6 10*3/uL (ref 4.0–10.5)

## 2014-08-16 MED ORDER — MOMETASONE FURO-FORMOTEROL FUM 100-5 MCG/ACT IN AERO: INHALATION_SPRAY | RESPIRATORY_TRACT | Status: DC

## 2014-08-16 NOTE — Patient Instructions (Addendum)
Please remember to go to the lab department downstairs for your tests - we will call you with the results when they are available.  Continue the dulera 100 Take 2 puffs first thing in am and then another 2 puffs about 12 hours later.  - blow it out through the nose  Only use your albuterol as a rescue medication to be used if you can't catch your breath by resting or doing a relaxed purse lip breathing pattern.  - The less you use it, the better it will work when you need it. - Ok to use up to 2 puffs  every 4 hours if you must but call for immediate appointment if use goes up over your usual need - Don't leave home without it !!  (think of it like the spare tire for your car)   Please see patient coordinator before you leave today  to schedule sinus ct and I will call you with results   Please schedule a follow up office visit in 6 weeks, call sooner if needed with pfts

## 2014-08-16 NOTE — Progress Notes (Signed)
Subjective:    Patient ID: Michael Mosley, male    DOB: 1950-09-23    MRN: [id number]  HPI  64 yowm never cig smoker throat ca age 64 dx by Dr Lucia Gaskins treated by [facility] complicated by hypothyroid referred by Dr Geannie Risen for new onset variable cough /wheezing and need for inhalers since 02/2013 referred to pulmonary clinic 08/16/2014     08/16/2014 1st [facility] office visit/ [name]   Chief Complaint  Patient presents with  . Advice Only    Referred by Dr. Truman Hayward;  having trouble catching his breath.  Did breathing test and 50% of lung capacity was gone.  Ruthe Mannan is helping a lot.  chart review shows pt with variable cough /wheeze attributed to sinus problems dating back to 02/2013 and rx with albuterol which he continued to take up to 3 x daily but quit working as well x 6 months and was given dulera 100 to take 2 bid by Dr Truman Hayward on 08/03/14 and improved a lot   but still confused with instructions between the ventolin and the dulera.  Sob just with exertion by time of first pulmonary ov / no noct symptoms.   Says hasn't been able to breath through the nose in years  No obvious day to day or daytime variabilty or assoc cough or  cp or chest tightness, subjective wheeze overt sinus or hb symptoms. No unusual exp hx or h/o childhood pna/ asthma or knowledge of premature birth.  Sleeping ok without nocturnal  or early am exacerbation  of respiratory  c/o's or need for noct saba. Also denies any obvious fluctuation of symptoms with weather or environmental changes or other aggravating or alleviating factors except as outlined above   Current Medications, Allergies, Complete Past Medical History, Past Surgical History, Family History, and Social History were reviewed in [employer] record.           Review of Systems  Constitutional: Negative for fever, chills, activity change, appetite change and unexpected weight change.  HENT: Negative for congestion, dental problem,  postnasal drip, rhinorrhea, sneezing, sore throat, trouble swallowing and voice change.   Eyes: Negative for visual disturbance.  Respiratory: Positive for shortness of breath. Negative for cough and choking.   Cardiovascular: Negative for chest pain and leg swelling.  Gastrointestinal: Negative for nausea, vomiting and abdominal pain.  Genitourinary: Negative for difficulty urinating.  Musculoskeletal: Negative for arthralgias.  Skin: Negative for rash.  Psychiatric/Behavioral: Negative for behavioral problems and confusion.       Objective:   Physical Exam  amb wm with nasal tone to voice   Wt Readings from Last 3 Encounters:  08/16/14 245 lb (111.131 kg)  08/03/14 238 lb (107.956 kg)  10/06/13 236 lb 12.8 oz (107.412 kg)    Vital signs reviewed   HEENT: nl dentition, mod non-specific swelling both turbinates, and nl orophanx. Nl external ear canals without cough reflex   NECK :  without JVD/Nodes/TM/ nl carotid upstrokes bilaterally   LUNGS: no acc muscle use, clear to A and P bilaterally without cough on insp or exp maneuvers   CV:  RRR  no s3 or murmur or increase in P2, no edema   ABD:  soft and nontender with nl excursion in the supine position. No bruits or organomegaly, bowel sounds nl  MS:  warm without deformities, calf tenderness, cyanosis or clubbing  SKIN: warm and dry without lesions    NEURO:  alert, approp, no deficits  I personally reviewed images and agree with radiology impression as follows:  CXR:  08/06/14 Lungs borderline hyperexpanded but clear. No neoplastic focus appreciable. No edema or consolidation       Assessment & Plan:

## 2014-08-16 NOTE — Assessment & Plan Note (Signed)
-   08/16/2014 p extensive coaching HFA effectiveness =    90%   Clearly has asthma that has been poorly controlled for several years and "controlled" with saba until the last 6 months but some better on dulera  DDX of  difficult airways management all start with A and  include Adherence, Ace Inhibitors, Acid Reflux, Active Sinus Disease, Alpha 1 Antitripsin deficiency, Anxiety masquerading as Airways dz,  ABPA,  allergy(esp in young), Aspiration (esp in elderly), Adverse effects of DPI,  Active smokers, plus two Bs  = Bronchiectasis and Beta blocker use..and one C= CHF  Adherence is always the initial "prime suspect" and is a multilayered concern that requires a "trust but verify" approach in every patient - starting with knowing how to use medications, especially inhalers, correctly, keeping up with refills and understanding the fundamental difference between maintenance and prns vs those medications only taken for a very short course and then stopped and not refilled.  - The proper method of use, as well as anticipated side effects, of a metered-dose inhaler are discussed and demonstrated to the patient. Improved effectiveness after extensive coaching during this visit to a level of approximately  90% so ok to continue dulera 100 but use it as maint rx = 2bid  ? Active sinus dz > check sinus ct and referral back to Dr Gwenlyn Saran if needed  ? Allergy/ note eosinophilia or previous differentials > repeat allergy profile/ consider trial of singulair and referral to allergy if needed in future  Each maintenance medication was reviewed in detail including most importantly the difference between maintenance and as needed and under what circumstances the prns are to be used.  Please see instructions for details which were reviewed in writing and the patient given a copy.

## 2014-08-16 NOTE — Assessment & Plan Note (Addendum)
S/p RT per [facility]  2007 / Dr Lucia Gaskins is ENT   No evidence of any recurrence or difficulty with upper airway post RT

## 2014-08-17 LAB — ALLERGY FULL PROFILE
ALLERGEN, D PTERNOYSSINUS, D1: 0.25 kU/L — AB
Allergen,Goose feathers, e70: 0.3 kU/L — ABNORMAL HIGH
Aspergillus fumigatus, m3: <0.1 kU/L
BAHIA GRASS: 0.13 kU/L — AB
BOX ELDER: 0.2 kU/L — AB
Bermuda Grass: <0.1 kU/L
COMMON RAGWEED: 0.12 kU/L — AB
Candida Albicans: 0.14 kU/L — ABNORMAL HIGH
Cat Dander: <0.1 kU/L
D. FARINAE: 0.16 kU/L — AB
Dog Dander: 0.12 kU/L — ABNORMAL HIGH
ELM IGE: 0.29 kU/L — AB
FESCUE: 0.2 kU/L — AB
G005 RYE, PERENNIAL: 0.16 kU/L — AB
G009 Red Top: 0.22 kU/L — ABNORMAL HIGH
Goldenrod: 0.13 kU/L — ABNORMAL HIGH
HELMINTHOSPORIUM HALODES: 0.12 kU/L — AB
House Dust Hollister: 0.17 kU/L — ABNORMAL HIGH
IGE (IMMUNOGLOBULIN E), SERUM: 874 kU/L — AB (ref <115)
LAMB'S QUARTERS CLASS: 0.15 kU/L — AB
OAK CLASS: 0.15 kU/L — AB
Plantain: 0.18 kU/L — ABNORMAL HIGH
Sycamore Tree: 0.17 kU/L — ABNORMAL HIGH
TIMOTHY GRASS: 0.14 kU/L — AB

## 2014-08-17 NOTE — Progress Notes (Signed)
Quick Note:  LMTCB ______ 

## 2014-08-17 NOTE — Progress Notes (Signed)
Quick Note:  Spoke with pt and notified of results per Dr. Wert. Pt verbalized understanding and denied any questions.  ______ 

## 2014-08-18 ENCOUNTER — Ambulatory Visit (INDEPENDENT_AMBULATORY_CARE_PROVIDER_SITE_OTHER)
Admission: RE | Admit: 2014-08-18 | Discharge: 2014-08-18 | Disposition: A | Discharge status: Home / Self Care | Payer: No Typology Code available for payment source | Source: Ambulatory Visit | Attending: Internal Medicine | Admitting: Internal Medicine

## 2014-08-18 ENCOUNTER — Other Ambulatory Visit: Payer: Self-pay | Admitting: Internal Medicine

## 2014-08-18 DIAGNOSIS — J4530 Mild persistent asthma, uncomplicated: Secondary | ICD-10-CM

## 2014-08-18 DIAGNOSIS — J329 Chronic sinusitis, unspecified: Secondary | ICD-10-CM | POA: Insufficient documentation

## 2014-08-18 NOTE — Progress Notes (Signed)
Quick Note:  Spoke with pt and notified of results per Dr. Wert. Pt verbalized understanding and denied any questions.  ______ 

## 2014-08-24 ENCOUNTER — Telehealth: Payer: Self-pay | Admitting: Internal Medicine

## 2014-08-24 MED ORDER — MOMETASONE FURO-FORMOTEROL FUM 100-5 MCG/ACT IN AERO
INHALATION_SPRAY | RESPIRATORY_TRACT | Status: DC
Start: 2014-08-24 — End: 2015-06-15

## 2014-08-24 NOTE — Telephone Encounter (Signed)
Pt aware order placed. Nothing further needed

## 2014-08-29 ENCOUNTER — Ambulatory Visit: Payer: Self-pay | Admitting: Radiation Oncology

## 2014-09-15 ENCOUNTER — Encounter: Payer: Self-pay | Admitting: Urgent Care

## 2014-09-27 ENCOUNTER — Ambulatory Visit: Payer: No Typology Code available for payment source | Admitting: Internal Medicine

## 2014-09-28 ENCOUNTER — Ambulatory Visit
Admission: RE | Admit: 2014-09-28 | Discharge: 2014-09-28 | Disposition: A | Discharge status: Home / Self Care | Payer: No Typology Code available for payment source | Source: Ambulatory Visit | Attending: Radiation Oncology | Admitting: Radiation Oncology

## 2014-09-28 ENCOUNTER — Encounter: Payer: Self-pay | Admitting: Radiation Oncology

## 2014-09-28 VITALS — BP 130/80 | HR 65 | Temp 97.7°F | Resp 16 | Ht 73.0 in | Wt 246.6 lb

## 2014-09-28 DIAGNOSIS — C01 Malignant neoplasm of base of tongue: Secondary | ICD-10-CM

## 2014-09-28 MED ORDER — LEVOTHYROXINE SODIUM 75 MCG PO TABS: 75.0000 ug | ORAL_TABLET | Freq: Every day | ORAL | Status: DC

## 2014-09-28 NOTE — Progress Notes (Signed)
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 He reports pain in his left hip at a 2/10. He has had the pain for about 2 years and thinks it is from an old injury. He reports he was told he needs sinus surgery by Dr. Lucia Gaskins. He reports a dry mouth and says bit of honey helps. He reports his taste buds did come back but are now gone due to his sinus issues. He reports he can eat whatever he wants. He does report with certain foods, like barbeque, and food gets stuck in chest. He is requesting a refill on synthroid and is wondering if he can get a 6 month refill. He is working part time. The pt reports sob during exertion and uses albuterol for it. The pt also reports some dental issues with his posterior molars. Patient did present with some wheezing and breathing problems earlier this year. He was seen by pulmonary medicine and given prescriptions for this issue.   ALLERGIES:  is allergic to albuterol sulfate.  Meds: Current Outpatient Prescriptions  Medication Sig Dispense Refill  . aspirin 81 MG tablet Take 81 mg by mouth daily.    . fluticasone (FLONASE) 50 MCG/ACT nasal spray Use 2 sprays each nostril twice daily for 4 days, then daily 16 g 6  . ibuprofen (ADVIL,MOTRIN) 200 MG tablet Take 200 mg by mouth every 6 (six) hours as needed.    Marland Kitchen levothyroxine (SYNTHROID, LEVOTHROID) 75 MCG tablet Take 1 tablet (75 mcg total) by mouth daily. 90 tablet 4  . mometasone-formoterol (DULERA) 100-5 MCG/ACT AERO Take 2 puffs first thing in am and then another 2 puffs about 12 hours later. 1 Inhaler 11  . VENTOLIN HFA 108 (90 BASE) MCG/ACT  inhaler INHALE 2 PUFFS INTO THE LUNGS EVERY 6 HOURS AS NEEDED. OFFICE VISIT NEEDED FOR FURTHER REFILLS. (Patient not taking: Reported on 09/28/2014) 18 g 2   No current facility-administered medications for this encounter.    Physical Findings: The patient is in no acute distress. Patient is alert and oriented.  height is 6\' 1"  (1.854 m) and weight is 246 lb 9.6 oz (111.857 kg). His oral temperature is 97.7 F (36.5 C). His blood pressure is 130/80 and his pulse is 65. His respiration is 16 and oxygen saturation is 99%.   Patient  has a nasal quality to his voice. Examination of the axillary supraclavicular and neck region reveals no palpable lymphadenopathy. The lungs are clear to auscultation bilaterally. The heart has a regular rhythm and rate. Examination of oral cavity reveals no mucosal lesions or thrush. Patient has mild radiation changes noted in the soft palate and pharyngeal area. Palpation along the base of tongue and pharyngeal area reveals no suspicious induration.  Indirect mirror examination Patient proceeded to undergo a indirect mirror examination through the oral cavity. A good view of the base of tongue and vallecula was obtained. No mucosal lesions are noted. The vocal cords moved well on examination. The patient tolerated the procedure well.  Lab Findings: Lab Results  Component Value Date   WBC 6.0 08/16/2014   HGB 14.5  08/16/2014   HCT 42.3 08/16/2014   MCV 91.0 08/16/2014   PLT 177.0 08/16/2014   Lab Results  Component Value Date   TSH 1.601 08/03/2014    Radiographic Findings: No results found.  Impression: No evidence of recurrence on clinical exam today.  Plan: The patient has finally obtained a primary care physician and health insurance.  I will release him to his PCP.  I will be glad to see him on a prn basis.  If the pt has any questions or concerns, he is to contact me. I will also refill the pt's Levothyroxine. After these refills expire, he will  receive the Levothyroxine from his PCP. The pt will see Dr. Lucia Gaskins about his sinuses in the near future.  This document serves as a record of services personally performed by Gery Pray, MD. It was created on his behalf by Darcus Austin, a trained medical scribe. The creation of this record is based on the scribe's personal observations and the provider's statements to them. This document has been checked and approved by the attending provider.  ____________________________________ Blair Promise, MD

## 2014-09-28 NOTE — Progress Notes (Signed)
Michael Mosley here for follow up.  He reports pain in his left hip at a 2/10.  He has had the pain for about 2 years and thinks it is from an old injury.  He reports he was told he needs sinus surgery by Dr. Lucia Gaskins.  He reports a dry mouth and says bit o honey helps.  He reports his taste buds did come back but are now gone due to his sinus issues.  He reports he can eat whatever he wants.  He does report with certain foods, like barbeque, food gets stuck in chest.  He is requesting a refill on synthroid and is wondering if he can get a 6 month refill.  He is working part time.  BP 130/80 mmHg  Pulse 65  Temp(Src) 97.7 F (36.5 C) (Oral)  Resp 16  Ht 6\' 1"  (1.854 m)  Wt 246 lb 9.6 oz (111.857 kg)  BMI 32.54 kg/m2  SpO2 99%   Wt Readings from Last 3 Encounters:  09/28/14 246 lb 9.6 oz (111.857 kg)  08/16/14 245 lb (111.131 kg)  08/03/14 238 lb (107.956 kg)

## 2014-11-07 ENCOUNTER — Ambulatory Visit (INDEPENDENT_AMBULATORY_CARE_PROVIDER_SITE_OTHER): Payer: No Typology Code available for payment source | Admitting: Emergency Medicine

## 2014-11-07 VITALS — BP 154/96 | HR 80 | Temp 97.7°F | Resp 17 | Ht 72.5 in | Wt 249.0 lb

## 2014-11-07 DIAGNOSIS — J0140 Acute pansinusitis, unspecified: Secondary | ICD-10-CM | POA: Diagnosis not present

## 2014-11-07 MED ORDER — PSEUDOEPHEDRINE-GUAIFENESIN ER 60-600 MG PO TB12: 1.0000 | ORAL_TABLET | Freq: Two times a day (BID) | ORAL | Status: DC

## 2014-11-07 MED ORDER — AMOXICILLIN-POT CLAVULANATE 875-125 MG PO TABS: 1.0000 | ORAL_TABLET | Freq: Two times a day (BID) | ORAL | Status: DC

## 2014-11-07 NOTE — Patient Instructions (Signed)

## 2014-11-07 NOTE — Progress Notes (Signed)
Subjective:  Patient ID: Michael Mosley, male    DOB: [birth year]  Age: 64 y.o. MRN: [id number]  CC: Sinusitis and Headache   HPI KAIMANA LURZ presents   With nasal congestion and and purulent drainage from  Left side of his nose. He has no fever or chills. He has headache.  He has postnasal drainage. He has no cough or wheezing or shortness of breath. 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 He's had no improvement with over-the-counter medication.  History Winslow has a past medical history of Thyroid disease; Heart murmur; History of radiation therapy (02/2005); Asthma; and Cancer.   He has no past surgical history on file.   His  family history is not on file.  He   reports that he has never smoked. He does not have any smokeless tobacco history on file. His alcohol and drug histories are not on file.  Outpatient Prescriptions Prior to Visit  Medication Sig Dispense Refill  . aspirin 81 MG tablet Take 81 mg by mouth daily.    . fluticasone (FLONASE) 50 MCG/ACT nasal spray Use 2 sprays each nostril twice daily for 4 days, then daily 16 g 6  . ibuprofen (ADVIL,MOTRIN) 200 MG tablet Take 200 mg by mouth every 6 (six) hours as needed.    Marland Kitchen levothyroxine (SYNTHROID, LEVOTHROID) 75 MCG tablet Take 1 tablet (75 mcg total) by mouth daily. 90 tablet 2  . mometasone-formoterol (DULERA) 100-5 MCG/ACT AERO Take 2 puffs first thing in am and then another 2 puffs about 12 hours later. 1 Inhaler 11  . VENTOLIN HFA 108 (90 BASE) MCG/ACT inhaler INHALE 2 PUFFS INTO THE LUNGS EVERY 6 HOURS AS NEEDED. OFFICE VISIT NEEDED FOR FURTHER REFILLS. (Patient not taking: Reported on 09/28/2014) 18 g 2   No facility-administered medications prior to visit.    Social History   Social History  . Marital Status: Divorced    Spouse Name: N/A  . Number of Children: N/A  . Years of Education: N/A   Social History Main Topics  . Smoking status: Never  Smoker   . Smokeless tobacco: None  . Alcohol Use: None  . Drug Use: None  . Sexual Activity: Not Asked   Other Topics Concern  . None   Social History Narrative     Review of Systems  Constitutional: Negative for fever, chills and appetite change.  HENT: Negative for congestion, ear pain, postnasal drip, sinus pressure and sore throat.   Eyes: Negative for pain and redness.  Respiratory: Negative for cough, shortness of breath and wheezing.   Cardiovascular: Negative for leg swelling.  Gastrointestinal: Negative for nausea, vomiting, abdominal pain, diarrhea, constipation and blood in stool.  Endocrine: Negative for polyuria.  Genitourinary: Negative for dysuria, urgency, frequency and flank pain.  Musculoskeletal: Negative for gait problem.  Skin: Negative for rash.  Neurological: Negative for weakness and headaches.  Psychiatric/Behavioral: Negative for confusion and decreased concentration. The patient is not nervous/anxious.     Objective:  BP 154/96 mmHg  Pulse 80  Temp(Src) 97.7 F (36.5 C) (Oral)  Resp 17  Ht 6' 0.5" (1.842 m)  Wt 249 lb (112.946 kg)  BMI 33.29 kg/m2  SpO2 98%  Physical Exam  Constitutional: He is oriented to person, place, and time. He appears well-developed and well-nourished. No distress.  HENT:  Head: Normocephalic and atraumatic.  Right Ear: External ear normal.  Left Ear: External ear normal.  Nose: Nose  normal.  Eyes: Conjunctivae and EOM are normal. Pupils are equal, round, and reactive to light. No scleral icterus.  Neck: Normal range of motion. Neck supple. No tracheal deviation present.  Cardiovascular: Normal rate, regular rhythm and normal heart sounds.   Pulmonary/Chest: Effort normal. No respiratory distress. He has no wheezes. He has no rales.  Musculoskeletal: He exhibits no edema.  Lymphadenopathy:    He has no cervical adenopathy.  Neurological: He is alert and oriented to person, place, and time. Coordination normal.    Skin: Skin is warm and dry. No rash noted.  Psychiatric: He has a normal mood and affect. His behavior is normal.      Assessment & Plan:   Aundra was seen today for sinusitis and headache.  Diagnoses and all orders for this visit:  Acute pansinusitis, recurrence not specified  Other orders -     amoxicillin-clavulanate (AUGMENTIN) 875-125 MG per tablet; Take 1 tablet by mouth 2 (two) times daily. -     pseudoephedrine-guaifenesin (MUCINEX D) 60-600 MG per tablet; Take 1 tablet by mouth every 12 (twelve) hours.   I am having Mr. Melikian start on amoxicillin-clavulanate and pseudoephedrine-guaifenesin. I am also having him maintain his aspirin, fluticasone, VENTOLIN HFA, mometasone-formoterol, ibuprofen, and levothyroxine.  Meds ordered this encounter  Medications  . amoxicillin-clavulanate (AUGMENTIN) 875-125 MG per tablet    Sig: Take 1 tablet by mouth 2 (two) times daily.    Dispense:  20 tablet    Refill:  0  . pseudoephedrine-guaifenesin (MUCINEX D) 60-600 MG per tablet    Sig: Take 1 tablet by mouth every 12 (twelve) hours.    Dispense:  18 tablet    Refill:  0    Appropriate red flag conditions were discussed with the patient as well as actions that should be taken.  Patient expressed his understanding.  Follow-up: Return if symptoms worsen or fail to improve.  Roselee Culver, MD

## 2014-12-22 ENCOUNTER — Other Ambulatory Visit: Payer: Self-pay | Admitting: Family Medicine

## 2015-06-14 ENCOUNTER — Telehealth: Payer: Self-pay | Admitting: Oncology

## 2015-06-14 NOTE — Telephone Encounter (Signed)
Called [facility] and advised the pharmacist that the refill request for levothyroxine should be sent to Mr. Cillo's primary care doctor.

## 2015-06-15 ENCOUNTER — Ambulatory Visit (INDEPENDENT_AMBULATORY_CARE_PROVIDER_SITE_OTHER): Payer: BLUE CROSS/BLUE SHIELD | Admitting: Family Medicine

## 2015-06-15 ENCOUNTER — Other Ambulatory Visit: Payer: Self-pay | Admitting: Emergency Medicine

## 2015-06-15 ENCOUNTER — Ambulatory Visit (INDEPENDENT_AMBULATORY_CARE_PROVIDER_SITE_OTHER): Payer: BLUE CROSS/BLUE SHIELD

## 2015-06-15 VITALS — BP 124/86 | HR 65 | Temp 97.4°F | Resp 16 | Ht 72.0 in | Wt 239.0 lb

## 2015-06-15 DIAGNOSIS — R059 Cough, unspecified: Secondary | ICD-10-CM

## 2015-06-15 DIAGNOSIS — R05 Cough: Secondary | ICD-10-CM

## 2015-06-15 DIAGNOSIS — J309 Allergic rhinitis, unspecified: Secondary | ICD-10-CM | POA: Diagnosis not present

## 2015-06-15 DIAGNOSIS — J45909 Unspecified asthma, uncomplicated: Secondary | ICD-10-CM

## 2015-06-15 DIAGNOSIS — E039 Hypothyroidism, unspecified: Secondary | ICD-10-CM

## 2015-06-15 LAB — TSH: TSH: 1.43 mIU/L (ref 0.40–4.50)

## 2015-06-15 MED ORDER — FLUTICASONE PROPIONATE 50 MCG/ACT NA SUSP: NASAL | Status: DC

## 2015-06-15 MED ORDER — ALBUTEROL SULFATE HFA 108 (90 BASE) MCG/ACT IN AERS: 1.0000 | INHALATION_SPRAY | Freq: Four times a day (QID) | RESPIRATORY_TRACT | Status: DC | PRN

## 2015-06-15 MED ORDER — PREDNISONE 20 MG PO TABS: 40.0000 mg | ORAL_TABLET | Freq: Every day | ORAL | Status: DC

## 2015-06-15 MED ORDER — AZITHROMYCIN 250 MG PO TABS: ORAL_TABLET | ORAL | Status: DC

## 2015-06-15 MED ORDER — MOMETASONE FURO-FORMOTEROL FUM 100-5 MCG/ACT IN AERO: INHALATION_SPRAY | RESPIRATORY_TRACT | Status: DC

## 2015-06-15 MED ORDER — LEVOTHYROXINE SODIUM 75 MCG PO TABS: 75.0000 ug | ORAL_TABLET | Freq: Every day | ORAL | Status: DC

## 2015-06-15 NOTE — Patient Instructions (Addendum)
IF you received an x-ray today, you will receive an invoice from [facility]. Please contact [facility] at [phone] with questions or concerns regarding your invoice.   IF you received labwork today, you will receive an invoice from [employer]. Please contact [facility] at [phone] with questions or concerns regarding your invoice.   Our billing staff will not be able to assist you with questions regarding bills from these companies.  You will be contacted with the lab results as soon as they are available. The fastest way to get your results is to activate your My Chart account. Instructions are located on the last page of this paperwork. If you have not heard from [place] regarding the results in 2 weeks, please contact this office.     Ruthe Mannan is your maintenance medication. 2 puffs twice per day to maintain control of asthma.  Ventolin is a rescue inhaler, use up to every 6 hours as needed for wheezing, but only if needed. Currently you appear to have an asthmatic bronchitis or flare of asthma. Take Prednisone 40 mg per day for 3 days, and start Z-Pak as discussed. You can use the Ventolin as needed for the current wheezing, but the symptoms should improve to where you only need that intermittently once the current flare has resolved. Follow-up in the next 2-3 months to determine asthma control,  sooner if worse.  I refilled your Flonase and your Synthroid at same doses for now.  Return to the clinic or go to the nearest emergency room if any of your symptoms worsen or new symptoms occur.  Asthma, Acute Bronchospasm Acute bronchospasm caused by asthma is also referred to as an asthma attack. Bronchospasm means your air passages become narrowed. The narrowing is caused by inflammation and tightening of the muscles in the air tubes (bronchi) in your lungs. This can make it hard to breathe or cause you to wheeze and cough. CAUSES Possible  triggers are:  Animal dander from the skin, hair, or feathers of animals.  Dust mites contained in house dust.  Cockroaches.  Pollen from trees or grass.  Mold.  Cigarette or tobacco smoke.  Air pollutants such as dust, household cleaners, hair sprays, aerosol sprays, paint fumes, strong chemicals, or strong odors.  Cold air or weather changes. Cold air may trigger inflammation. Winds increase molds and pollens in the air.  Strong emotions such as crying or laughing hard.  Stress.  Certain medicines such as aspirin or beta-blockers.  Sulfites in foods and drinks, such as dried fruits and wine.  Infections or inflammatory conditions, such as a flu, cold, or inflammation of the nasal membranes (rhinitis).  Gastroesophageal reflux disease (GERD). GERD is a condition where stomach acid backs up into your esophagus.  Exercise or strenuous activity. SIGNS AND SYMPTOMS   Wheezing.  Excessive coughing, particularly at night.  Chest tightness.  Shortness of breath. DIAGNOSIS  Your health care provider will ask you about your medical history and perform a physical exam. A chest X-ray or blood testing may be performed to look for other causes of your symptoms or other conditions that may have triggered your asthma attack. TREATMENT  Treatment is aimed at reducing inflammation and opening up the airways in your lungs. Most asthma attacks are treated with inhaled medicines. These include quick relief or rescue medicines (such as bronchodilators) and controller medicines (such as inhaled corticosteroids). These medicines are sometimes given through an inhaler or a nebulizer. Systemic steroid medicine taken  by mouth or given through an IV tube also can be used to reduce the inflammation when an attack is moderate or severe. Antibiotic medicines are only used if a bacterial infection is present.  HOME CARE INSTRUCTIONS   Rest.  Drink plenty of liquids. This helps the mucus to remain  thin and be easily coughed up. Only use caffeine in moderation and do not use alcohol until you have recovered from your illness.  Do not smoke. Avoid being exposed to secondhand smoke.  You play a critical role in keeping yourself in good health. Avoid exposure to things that cause you to wheeze or to have breathing problems.  Keep your medicines up-to-date and available. Carefully follow your health care provider's treatment plan.  Take your medicine exactly as prescribed.  When pollen or pollution is bad, keep windows closed and use an air conditioner or go to places with air conditioning.  Asthma requires careful medical care. See your health care provider for a follow-up as advised. If you are more than [redacted] weeks pregnant and you were prescribed any new medicines, let your obstetrician know about the visit and how you are doing. Follow up with your health care provider as directed.  After you have recovered from your asthma attack, make an appointment with your outpatient doctor to talk about ways to reduce the likelihood of future attacks. If you do not have a doctor who manages your asthma, make an appointment with a primary care doctor to discuss your asthma. SEEK IMMEDIATE MEDICAL CARE IF:   You are getting worse.  You have trouble breathing. If severe, call your local emergency services (911 in the [place]  You develop chest pain or discomfort.  You are vomiting.  You are not able to keep fluids down.  You are coughing up yellow, green, brown, or bloody sputum.  You have a fever and your symptoms suddenly get worse.  You have trouble swallowing. MAKE SURE YOU:   Understand these instructions.  Will watch your condition.  Will get help right away if you are not doing well or get worse.   This information is not intended to replace advice given to you by your health care provider. Make sure you discuss any questions you have with your health care provider.   Document  Released: 06/12/2006 Document Revised: 03/02/2013 Document Reviewed: 09/02/2012 Elsevier Interactive Patient Education [employer].

## 2015-06-15 NOTE — Progress Notes (Signed)
Subjective:  By signing my name below, I, Michael Mosley, attest that this documentation has been prepared under the direction and in the presence of Michael Ray, MD. Electronically Signed: [name], [place]. 06/15/2015 , 2:41 PM .  Patient was seen in Room 1 .   Patient ID: Michael Mosley, male    DOB: 12-Oct-1950, 65 y.o.   MRN: [id number] Chief Complaint  Patient presents with  . Cough  . Chest Congestion   HPI Michael Mosley is a 65 y.o. male   Cough with wheezing Here with cough and chest congestion ongoing for a couple weeks. He's been having productive cough of thick clear sputum. He also notes having some wheezing. He denies measured fever. He denies smoking. He's taken prednisone in 2015 for sinusitis at that time.  He was seen by pulmonologist, Dr. Melvyn Mosley, in June 2016. He has history of throat cancer at age 42. He has mild persistent chronic asthma, and was recommended to continue dulera 2 puffs bid as a maintenance medication.   Chest Pain He's been having slight left chest pains when he coughs.   He was seen by Dr. Einar Mosley 2 years ago for cardiology evaluation including a stress test, which was reported negative.   Family History His father had a heart attack at 45. He also mentioned most males on his father's side have had heart issues in their 31s.   Medications [2:10PM] He has some confusion with his two inhalers, dulera and ventolin. He was asked to return to the clinic with his medications for clarification. He's been using 2 puffs bid of 1 inhaler and using another inhaler as a rescue inhaler as needed.   [2:30PM] Patient returned to clinic with his inhalers.  He hasn't been taking his dulera inhaler, thought it was a rescue inhaler.  He's been using ventolin inhaler, 2 puffs bid.   He also takes flonase for allergic rhinitis.   Hypothyroidism He also needs levothyroxine medication refill. He denies missing any doses of his medication.  Last TSH normal  at 1.6 in May 2016.  He is usually followed by Dr. Sondra Mosley for hypothyroidism.   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   Patient Active Problem List   Diagnosis Date Noted  . Sinusitis, chronic 08/18/2014  . Mild persistent chronic asthma without complication [id number]  . 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 Malignant neoplasm of base of tongue ([place]) 10/09/2011  . Hypothyroidism 10/09/2011   Past Medical History  Diagnosis Date  . Thyroid disease   . Heart murmur   . History of radiation therapy 02/2005    CTV 6810 cGy, PTV 6000 cGy, ETV 227 cGy  . Asthma   . Cancer ([place])     tongue cancer s/p chemo/RXT, followed by Dr Michael Mosley.    History reviewed. No pertinent past surgical history. Allergies  Allergen Reactions  . Albuterol Sulfate Palpitations   Prior to Admission medications   Medication Sig Start Date End Date Taking? Authorizing Provider  aspirin 81 MG tablet Take 81 mg by mouth daily.   Yes Historical Provider, MD  fluticasone (FLONASE) 50 MCG/ACT nasal spray INSTILL 2 SPRAYS INTO EACH NOSTRIL TWICE A DAY FOR 4 DAYS THEN 2 SPRAYS INTO EACH NOSTRIL ONCE DAILY 12/23/14  Yes Michael Jeffery, PA-C  levothyroxine (SYNTHROID, LEVOTHROID) 75 MCG tablet Take 1 tablet (75 mcg total) by mouth daily. 09/28/14  Yes Michael Pray, MD  VENTOLIN HFA 108 (90 BASE) MCG/ACT inhaler INHALE 2 PUFFS INTO THE LUNGS EVERY  6 HOURS AS NEEDED. OFFICE VISIT NEEDED FOR FURTHER REFILLS. 08/10/14  Yes Michael P Le, DO  amoxicillin-clavulanate (AUGMENTIN) 875-125 MG per tablet Take 1 tablet by mouth 2 (two) times daily. Patient not taking: Reported on 06/15/2015 11/07/14   Michael Culver, MD  ibuprofen (ADVIL,MOTRIN) 200 MG tablet Take 200 mg by mouth every 6 (six) hours as needed. Reported on 06/15/2015    Historical Provider, MD  mometasone-formoterol (DULERA) 100-5 MCG/ACT AERO Take 2 puffs first thing in am and then another 2 puffs about 12 hours later. Patient not taking: Reported on 06/15/2015 08/24/14   Michael Rockers, MD  pseudoephedrine-guaifenesin [facility] D) 60-600 MG per tablet Take 1 tablet by mouth every 12 (twelve) hours. Patient not taking: Reported on 06/15/2015 11/07/14 11/07/15  Michael Culver, MD   Social History   Social History  . Marital Status: Divorced    Spouse Name: N/A  . Number of Children: N/A  . Years of Education: N/A   Occupational History  . Not on file.   Social History Main Topics  . Smoking status: Never Smoker   . Smokeless tobacco: Not on file  . Alcohol Use: Not on file  . Drug Use: Not on file  . Sexual Activity: Not on file   Other Topics Concern  . Not on file   Social History Narrative   Review of Systems  Constitutional: Negative for fever, chills and fatigue.  HENT: Positive for congestion. Negative for rhinorrhea and sore throat.   Respiratory: Positive for cough and wheezing. Negative for shortness of breath.   Cardiovascular: Positive for chest pain. Negative for leg swelling.  Gastrointestinal: Negative for nausea, vomiting and diarrhea.  Skin: Negative for rash and wound.      Objective:   Physical Exam  Constitutional: He is oriented to person, place, and time. He appears well-developed and well-nourished. No distress.  HENT:  Head: Normocephalic and atraumatic.  Eyes: EOM are normal. Pupils are equal, round, and reactive to light.  Neck: Neck supple.  Cardiovascular: Normal rate, regular rhythm and normal heart sounds.  Exam reveals no gallop and no friction rub.   No murmur heard. Pulmonary/Chest: Effort normal. No respiratory distress. He has no decreased breath sounds. He has wheezes (diffuse expiratory). He has rhonchi (forced). He has no rales.  Musculoskeletal: Normal range of motion.  Neurological: He is alert and oriented to person, place, and time.  Skin: Skin is warm and dry.  Psychiatric: He has a normal mood and affect. His behavior is normal.  Nursing note and vitals reviewed.   Filed Vitals:   06/15/15 1321    BP: 124/86  Pulse: 65  Temp: 97.4 F (36.3 C)  Resp: 16  Height: 6' (1.829 m)  Weight: 239 lb (108.41 kg)  SpO2: 98%   Dg Chest 2 View  06/15/2015  CLINICAL DATA:  Cough.  Congestion. EXAM: CHEST  2 VIEW COMPARISON:  08/03/2014. FINDINGS: Mediastinum and hilar structures normal. Lungs are clear. Heart size normal. No pleural effusion or pneumothorax. No acute bony abnormality. IMPRESSION: No acute cardiopulmonary disease. Electronically Signed   By: Marcello Moores  Register   On: 06/15/2015 15:15      Assessment & Plan:   JANLUCAS MUENCHOW is a 65 y.o. male Cough - Plan: DG Chest 2 View, azithromycin (ZITHROMAX) 250 MG tablet, predniSONE (DELTASONE) 20 MG tablet Asthmatic bronchitis, unspecified asthma severity, uncomplicated - Plan: mometasone-formoterol (DULERA) 100-5 MCG/ACT AERO, albuterol (VENTOLIN HFA) 108 (90 Base) MCG/ACT inhaler, azithromycin (ZITHROMAX)  250 MG tablet, predniSONE (DELTASONE) 20 MG tablet  -Asthmatic bronchitis/asthma flare. Also some confusion regarding correct the dosing of medications.  -Start a Z-Pak, prednisone 40 mg daily 3 days, continue Ventolin if needed, and Dulera 2 puffs twice a day as maintenance medication. Follow-up in the next 2-3 months to assess asthma control RTC precautions if worse sooner.  Allergic rhinitis, unspecified allergic rhinitis type - Plan: fluticasone (FLONASE) 50 MCG/ACT nasal spray  -Continue Flonase nasal spray. Refilled.  Hypothyroidism, unspecified hypothyroidism type - Plan: TSH, levothyroxine (SYNTHROID, LEVOTHROID) 75 MCG tablet  -Check TSH, Synthroid refilled.   Meds ordered this encounter  Medications  . mometasone-formoterol (DULERA) 100-5 MCG/ACT AERO    Sig: Take 2 puffs first thing in am and then another 2 puffs about 12 hours later.    Dispense:  1 Inhaler    Refill:  5  . albuterol (VENTOLIN HFA) 108 (90 Base) MCG/ACT inhaler    Sig: Inhale 1-2 puffs into the lungs every 6 (six) hours as needed for wheezing or  shortness of breath.    Dispense:  18 g    Refill:  1    FILL WITH VENTOLIN PER INSURANCE PREFERENCE  . azithromycin (ZITHROMAX) 250 MG tablet    Sig: Take 2 pills by mouth on day 1, then 1 pill by mouth per day on days 2 through 5.    Dispense:  6 tablet    Refill:  0  . predniSONE (DELTASONE) 20 MG tablet    Sig: Take 2 tablets (40 mg total) by mouth daily with breakfast.    Dispense:  6 tablet    Refill:  0  . levothyroxine (SYNTHROID, LEVOTHROID) 75 MCG tablet    Sig: Take 1 tablet (75 mcg total) by mouth daily.    Dispense:  90 tablet    Refill:  2  . fluticasone (FLONASE) 50 MCG/ACT nasal spray    Sig: INSTILL 2 SPRAYS INTO EACH NOSTRIL TWICE A DAY FOR 4 DAYS THEN 2 SPRAYS INTO EACH NOSTRIL ONCE DAILY    Dispense:  16 g    Refill:  6   Patient Instructions       IF you received an x-Mosley today, you will receive an invoice from [facility]. Please contact [facility] at [phone] with questions or concerns regarding your invoice.   IF you received labwork today, you will receive an invoice from [employer]. Please contact [facility] at [phone] with questions or concerns regarding your invoice.   Our billing staff will not be able to assist you with questions regarding bills from these companies.  You will be contacted with the lab results as soon as they are available. The fastest way to get your results is to activate your My Chart account. Instructions are located on the last page of this paperwork. If you have not heard from [place] regarding the results in 2 weeks, please contact this office.     Ruthe Mannan is your maintenance medication. 2 puffs twice per day to maintain control of asthma.  Ventolin is a rescue inhaler, use up to every 6 hours as needed for wheezing, but only if needed. Currently you appear to have an asthmatic bronchitis or flare of asthma. Take Prednisone 40 mg per day for 3 days, and start Z-Pak as discussed.  You can use the Ventolin as needed for the current wheezing, but the symptoms should improve to where you only need that intermittently once the current flare has resolved. Follow-up in the  next 2-3 months to determine asthma control,  sooner if worse.  I refilled your Flonase and your Synthroid at same doses for now.  Return to the clinic or go to the nearest emergency room if any of your symptoms worsen or new symptoms occur.  Asthma, Acute Bronchospasm Acute bronchospasm caused by asthma is also referred to as an asthma attack. Bronchospasm means your air passages become narrowed. The narrowing is caused by inflammation and tightening of the muscles in the air tubes (bronchi) in your lungs. This can make it hard to breathe or cause you to wheeze and cough. CAUSES Possible triggers are:  Animal dander from the skin, hair, or feathers of animals.  Dust mites contained in house dust.  Cockroaches.  Pollen from trees or grass.  Mold.  Cigarette or tobacco smoke.  Air pollutants such as dust, household cleaners, hair sprays, aerosol sprays, paint fumes, strong chemicals, or strong odors.  Cold air or weather changes. Cold air may trigger inflammation. Winds increase molds and pollens in the air.  Strong emotions such as crying or laughing hard.  Stress.  Certain medicines such as aspirin or beta-blockers.  Sulfites in foods and drinks, such as dried fruits and wine.  Infections or inflammatory conditions, such as a flu, cold, or inflammation of the nasal membranes (rhinitis).  Gastroesophageal reflux disease (GERD). GERD is a condition where stomach acid backs up into your esophagus.  Exercise or strenuous activity. SIGNS AND SYMPTOMS   Wheezing.  Excessive coughing, particularly at night.  Chest tightness.  Shortness of breath. DIAGNOSIS  Your health care provider will ask you about your medical history and perform a physical exam. A chest X-Mosley or Mosley testing may  be performed to look for other causes of your symptoms or other conditions that may have triggered your asthma attack. TREATMENT  Treatment is aimed at reducing inflammation and opening up the airways in your lungs. Most asthma attacks are treated with inhaled medicines. These include quick relief or rescue medicines (such as bronchodilators) and controller medicines (such as inhaled corticosteroids). These medicines are sometimes given through an inhaler or a nebulizer. Systemic steroid medicine taken by mouth or given through an IV tube also can be used to reduce the inflammation when an attack is moderate or severe. Antibiotic medicines are only used if a bacterial infection is present.  HOME CARE INSTRUCTIONS   Rest.  Drink plenty of liquids. This helps the mucus to remain thin and be easily coughed up. Only use caffeine in moderation and do not use alcohol until you have recovered from your illness.  Do not smoke. Avoid being exposed to secondhand smoke.  You play a critical role in keeping yourself in good health. Avoid exposure to things that cause you to wheeze or to have breathing problems.  Keep your medicines up-to-date and available. Carefully follow your health care provider's treatment plan.  Take your medicine exactly as prescribed.  When pollen or pollution is bad, keep windows closed and use an air conditioner or go to places with air conditioning.  Asthma requires careful medical care. See your health care provider for a follow-up as advised. If you are more than [redacted] weeks pregnant and you were prescribed any new medicines, let your obstetrician know about the visit and how you are doing. Follow up with your health care provider as directed.  After you have recovered from your asthma attack, make an appointment with your outpatient doctor to talk about ways to reduce the likelihood  of future attacks. If you do not have a doctor who manages your asthma, make an appointment with  a primary care doctor to discuss your asthma. SEEK IMMEDIATE MEDICAL CARE IF:   You are getting worse.  You have trouble breathing. If severe, call your local emergency services (911 in the [place]  You develop chest pain or discomfort.  You are vomiting.  You are not able to keep fluids down.  You are coughing up yellow, green, brown, or bloody sputum.  You have a fever and your symptoms suddenly get worse.  You have trouble swallowing. MAKE SURE YOU:   Understand these instructions.  Will watch your condition.  Will get help right away if you are not doing well or get worse.   This information is not intended to replace advice given to you by your health care provider. Make sure you discuss any questions you have with your health care provider.   Document Released: 06/12/2006 Document Revised: 03/02/2013 Document Reviewed: 09/02/2012 Elsevier Interactive Patient Education [employer].     I personally performed the services described in this documentation, which was scribed in my presence. The recorded information has been reviewed and considered, and addended by me as needed.

## 2015-07-02 ENCOUNTER — Encounter: Payer: Self-pay | Admitting: *Deleted

## 2016-01-18 ENCOUNTER — Other Ambulatory Visit: Payer: Self-pay | Admitting: Family Medicine

## 2016-01-18 DIAGNOSIS — J45909 Unspecified asthma, uncomplicated: Secondary | ICD-10-CM

## 2016-01-27 ENCOUNTER — Ambulatory Visit (INDEPENDENT_AMBULATORY_CARE_PROVIDER_SITE_OTHER): Payer: BLUE CROSS/BLUE SHIELD | Admitting: Family Medicine

## 2016-01-27 ENCOUNTER — Ambulatory Visit (INDEPENDENT_AMBULATORY_CARE_PROVIDER_SITE_OTHER): Payer: BLUE CROSS/BLUE SHIELD

## 2016-01-27 VITALS — BP 140/86 | HR 63 | Temp 97.9°F | Resp 16 | Ht 72.0 in | Wt 234.0 lb

## 2016-01-27 DIAGNOSIS — R059 Cough, unspecified: Secondary | ICD-10-CM

## 2016-01-27 DIAGNOSIS — R05 Cough: Secondary | ICD-10-CM

## 2016-01-27 DIAGNOSIS — K047 Periapical abscess without sinus: Secondary | ICD-10-CM

## 2016-01-27 MED ORDER — AMOXICILLIN 875 MG PO TABS: 875.0000 mg | ORAL_TABLET | Freq: Two times a day (BID) | ORAL | 0 refills | Status: DC

## 2016-01-27 NOTE — Patient Instructions (Addendum)
Take the Amoxicillin twice daily for the next 10 days.  This will help with the sinuses, jaw infection, and anything with her lungs.  Make sure to follow-up with the dentist early next week.  It was good to meet you today!    IF you received an x-ray today, you will receive an invoice from [facility]. Please contact [facility] at [phone] with questions or concerns regarding your invoice.   IF you received labwork today, you will receive an invoice from [employer]. Please contact [facility] at [phone] with questions or concerns regarding your invoice.   Our billing staff will not be able to assist you with questions regarding bills from these companies.  You will be contacted with the lab results as soon as they are available. The fastest way to get your results is to activate your My Chart account. Instructions are located on the last page of this paperwork. If you have not heard from [place] regarding the results in 2 weeks, please contact this office.

## 2016-01-27 NOTE — Progress Notes (Signed)
Michael Mosley is a 65 y.o. male who presents to [facility] today for jaw pain:  1.  Jaw pain:  Present since 8:00 last night. Patient states started out of the blue. Describes sharp stabbing pain alternated with dull aching right lower mandible. He has history of throat cancer with resection and radiation of the same side. However this was years ago and he has had no problems since then. He has been eating and drinking well before yesterday. 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  No fevers or chills. No drainage in his mouth that he is known.  2. Prolonged cough:  Cough for the past month. Initially had URI symptoms that started about a month ago. These have since cleared but cough has lasted. States this is mild and only notices once or twice a day. Does not keep him awake. As abovehe was chills. Nonproductive.  ROS as above.     PMH reviewed. Patient is a nonsmoker.   Past Medical History:  Diagnosis Date  . Asthma   . Cancer ([place])    tongue cancer s/p chemo/RXT, followed by Dr Sondra Come.   Marland Kitchen Heart murmur   . History of radiation therapy 02/2005   CTV 6810 cGy, PTV 6000 cGy, ETV 227 cGy  . Thyroid disease    No past surgical history on file.  Medications reviewed. Current Outpatient Prescriptions  Medication Sig Dispense Refill  . aspirin 81 MG tablet Take 81 mg by mouth daily.    . DULERA 100-5 MCG/ACT AERO INHALE 2 PUFFS 1ST THING IN THE AM,THEN ANOTHER 2 PUFFS ABOUT 12 HOURS LATER 13 g 0  . fluticasone (FLONASE) 50 MCG/ACT nasal spray INSTILL 2 SPRAYS INTO EACH NOSTRIL TWICE A DAY FOR 4 DAYS THEN 2 SPRAYS INTO EACH NOSTRIL ONCE DAILY 16 g 6  . levothyroxine (SYNTHROID, LEVOTHROID) 75 MCG tablet Take 1 tablet (75 mcg total) by mouth daily. 90 tablet 2   No current facility-administered medications for this visit.     Physical Exam:  BP  140/86   Pulse 63   Temp 97.9 F (36.6 C) (Oral)   Resp 16   Ht 6' (1.829 m)   Wt 234 lb (106.1 kg)   SpO2 97%   BMI 31.74 kg/m  Gen:  Alert, cooperative patient who appears stated age in no acute distress.  Vital signs reviewed. HEENT: EOMI.  Somewhat dry mucus membranes.  Very poor dentition.  Multiple broken teeth scattered around lower mandible. Has crowns on upper mandible. Dentition is worse on right lower mandible. I'm unable to see any drainage or purulence here. He does have swelling on Right outer mandible/skin around jaw in dependent area.   Pulm:  Clear to auscultation bilaterally with good air movement.  No wheezes or rales noted.   Cardiac:  Regular rate and rhythm without murmur auscultated.   Assessment and Plan:  1.  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 -Poor dentition. -Treat with amoxicillin today. Follow-up with dentist this week. He declines any pain medication today stating that once infection is treated they will likely relieve his pain. Also is unable to take many pain meds secondary to nausea.  2.  Reactive cough: -Triggered by recent URI. -Lung exam sounds good today. This is improving. -Any infection will be treated by  amoxicillin. Return if no improvement.

## 2016-03-05 ENCOUNTER — Ambulatory Visit (INDEPENDENT_AMBULATORY_CARE_PROVIDER_SITE_OTHER): Payer: BLUE CROSS/BLUE SHIELD | Admitting: Family Medicine

## 2016-03-05 VITALS — BP 110/80 | HR 69 | Temp 97.7°F | Resp 18 | Ht 72.0 in | Wt 226.0 lb

## 2016-03-05 DIAGNOSIS — E039 Hypothyroidism, unspecified: Secondary | ICD-10-CM | POA: Diagnosis not present

## 2016-03-05 DIAGNOSIS — J45909 Unspecified asthma, uncomplicated: Secondary | ICD-10-CM

## 2016-03-05 DIAGNOSIS — R062 Wheezing: Secondary | ICD-10-CM

## 2016-03-05 DIAGNOSIS — Z23 Encounter for immunization: Secondary | ICD-10-CM | POA: Diagnosis not present

## 2016-03-05 DIAGNOSIS — J4530 Mild persistent asthma, uncomplicated: Secondary | ICD-10-CM | POA: Diagnosis not present

## 2016-03-05 MED ORDER — LEVOTHYROXINE SODIUM 75 MCG PO TABS: 75.0000 ug | ORAL_TABLET | Freq: Every day | ORAL | 3 refills | Status: DC

## 2016-03-05 MED ORDER — PREDNISONE 20 MG PO TABS
ORAL_TABLET | ORAL | 0 refills | Status: DC
Start: 2016-03-05 — End: 2016-12-23

## 2016-03-05 MED ORDER — MOMETASONE FURO-FORMOTEROL FUM 100-5 MCG/ACT IN AERO: 2.0000 | INHALATION_SPRAY | Freq: Two times a day (BID) | RESPIRATORY_TRACT | 5 refills | Status: DC

## 2016-03-05 MED ORDER — ALBUTEROL SULFATE (2.5 MG/3ML) 0.083% IN NEBU
2.5000 mg | INHALATION_SOLUTION | Freq: Once | RESPIRATORY_TRACT | Status: AC
  Administered 2016-03-05: 2.5 mg via RESPIRATORY_TRACT

## 2016-03-05 NOTE — Patient Instructions (Addendum)
Use over-the-counter Debrox in the left ear. It will help soften the wax up so you can wash it out better.  Use the Dulera 2 inhalations twice a day. If doing well for 2 weeks you can cut back to just once daily. When springtime comes you probably can wean off of it.  Consider getting your heating and air system cleaned.  Use the albuterol inhaler (Proventil, ventolin) 2 inhalations every 4-6 hours when needed for wheezing or coughing.  Take prednisone 20 mg 3 tablets daily for 2 days, then 2 daily for 2 days, then 1 daily for 2 days. Best taken after breakfast to avoid messing up your sleep even worse.  Take the amoxicillin one twice daily for antibiotic  Return or go to the emergency room at any time if abruptly worse.  IF you received an x-ray today, you will receive an invoice from [facility]. Please contact [facility] at [phone] with questions or concerns regarding your invoice.   IF you received labwork today, you will receive an invoice from [facility]. Please contact [facility] at [phone] with questions or concerns regarding your invoice.   Our billing staff will not be able to assist you with questions regarding bills from these companies.  You will be contacted with the lab results as soon as they are available. The fastest way to get your results is to activate your My Chart account. Instructions are located on the last page of this paperwork. If you have not heard from [place] regarding the results in 2 weeks, please contact this office.

## 2016-03-05 NOTE — Progress Notes (Signed)
Patient ID: Michael Mosley, male    DOB: [birth year]  Age: 65 y.o. MRN: [id number]  Chief Complaint  Patient presents with  . CHEST CONGESTION  . Cough    Productive, Clear, x 1 year since using dulera  . Medication Refill    Levothyroxine  . Flu Vaccine    Subjective:   Patient has a history of wintertime asthma when his heat is on. He is been wheezing and coughing over the last 2 months, but is gotten worse now. He closes up the bed in his bedroom and that helps considerably. He does not smoke, having quit a long time ago. He has not been feverish. He does have some mucus production. He is short of breath.  He has used albuterol in the past. He has some at home. He says it makes him jittery. He uses his Dulera on a when necessary basis. We discussed that.  Current allergies, medications, problem list, past/family and social histories reviewed.  Objective:  BP 110/80   Pulse 69   Temp 97.7 F (36.5 C) (Oral)   Resp 18   Ht 6' (1.829 m)   Wt 226 lb (102.5 kg)   SpO2 95%   BMI 30.65 kg/m   Pulse ox is good. His TMs are normal. Except for wax in the left. Talk to him about using some Debrox. Neck supple without nodes. Throat clear. Chest has diffuse coarse wheezing bilaterally. Heart regular without murmur. Chest x-ray was normal a month ago. He is not edematous.  Assessment & Plan:   Assessment: 1. Needs flu shot   2. Asthmatic bronchitis, unspecified asthma severity, uncomplicated   3. Hypothyroidism, unspecified type   4. Mild persistent chronic asthma without complication   5. Wheezing       Plan: Breathing treatment will be given  After the treatment his lungs were clear  See discharge instructions and medications.  Orders Placed This Encounter  Procedures  . Flu Vaccine QUAD 36+ mos IM  . TSH    Meds ordered this encounter  Medications  . mometasone-formoterol (DULERA) 100-5 MCG/ACT AERO    Sig: Inhale 2 puffs into the lungs 2 (two) times daily.   Dispense:  13 g    Refill:  5    PATIENT NEEDS OFFICE VISIT FOR ADDITIONAL REFILLS  . levothyroxine (SYNTHROID, LEVOTHROID) 75 MCG tablet    Sig: Take 1 tablet (75 mcg total) by mouth daily.    Dispense:  90 tablet    Refill:  3  . albuterol (PROVENTIL) (2.5 MG/3ML) 0.083% nebulizer solution 2.5 mg         Patient Instructions   Use over-the-counter Debrox in the left ear. It will help soften the wax up so you can wash it out better.  Use the Dulera 2 inhalations twice a day. If doing well for 2 weeks you can cut back to just once daily. When springtime comes you probably can wean off of it.  Consider getting your heating and air system cleaned.  Use the albuterol inhaler (Proventil, ventolin) 2 inhalations every 4-6 hours when needed for wheezing or coughing.  Take prednisone 20 mg 3 tablets daily for 2 days, then 2 daily for 2 days, then 1 daily for 2 days. Best taken after breakfast to avoid messing up your sleep even worse.  Take the amoxicillin one twice daily for antibiotic  Return or go to the emergency room at any time if abruptly worse.  IF you received an x-ray today, you  will receive an invoice from [facility]. Please contact [facility] at [phone] with questions or concerns regarding your invoice.   IF you received labwork today, you will receive an invoice from [facility]. Please contact [facility] at [phone] with questions or concerns regarding your invoice.   Our billing staff will not be able to assist you with questions regarding bills from these companies.  You will be contacted with the lab results as soon as they are available. The fastest way to get your results is to activate your My Chart account. Instructions are located on the last page of this paperwork. If you have not heard from [place] regarding the results in 2 weeks, please contact this office.        No Follow-up on file.   Genia Perin, MD 03/05/2016

## 2016-03-05 NOTE — Addendum Note (Signed)
Addended by: Mamie Hundertmark H on: 03/05/2016 04:02 PM   Modules accepted: Orders

## 2016-03-06 LAB — SPECIMEN STATUS

## 2016-03-06 LAB — TSH: TSH: 2.88 u[IU]/mL (ref 0.450–4.500)

## 2016-08-07 ENCOUNTER — Other Ambulatory Visit: Payer: Self-pay | Admitting: Family Medicine

## 2016-08-07 DIAGNOSIS — J309 Allergic rhinitis, unspecified: Secondary | ICD-10-CM

## 2016-11-26 ENCOUNTER — Encounter (HOSPITAL_COMMUNITY): Payer: Self-pay

## 2016-11-26 ENCOUNTER — Encounter: Payer: Self-pay | Admitting: Urgent Care

## 2016-11-26 ENCOUNTER — Ambulatory Visit (INDEPENDENT_AMBULATORY_CARE_PROVIDER_SITE_OTHER): Payer: PPO | Admitting: Urgent Care

## 2016-11-26 ENCOUNTER — Emergency Department (HOSPITAL_COMMUNITY)
Admission: EM | Admit: 2016-11-26 | Discharge: 2016-11-26 | Discharge status: Against Medical Advice | Payer: PPO | Attending: Emergency Medicine | Admitting: Emergency Medicine

## 2016-11-26 VITALS — BP 127/73 | HR 53 | Temp 97.9°F | Resp 17 | Ht 73.0 in | Wt 220.0 lb

## 2016-11-26 DIAGNOSIS — J3089 Other allergic rhinitis: Secondary | ICD-10-CM | POA: Diagnosis not present

## 2016-11-26 DIAGNOSIS — R1033 Periumbilical pain: Secondary | ICD-10-CM | POA: Diagnosis not present

## 2016-11-26 DIAGNOSIS — K429 Umbilical hernia without obstruction or gangrene: Secondary | ICD-10-CM | POA: Diagnosis not present

## 2016-11-26 DIAGNOSIS — K460 Unspecified abdominal hernia with obstruction, without gangrene: Secondary | ICD-10-CM | POA: Diagnosis not present

## 2016-11-26 DIAGNOSIS — Z5321 Procedure and treatment not carried out due to patient leaving prior to being seen by health care provider: Secondary | ICD-10-CM | POA: Diagnosis present

## 2016-11-26 DIAGNOSIS — R110 Nausea: Secondary | ICD-10-CM | POA: Diagnosis not present

## 2016-11-26 DIAGNOSIS — Z7951 Long term (current) use of inhaled steroids: Secondary | ICD-10-CM | POA: Diagnosis not present

## 2016-11-26 DIAGNOSIS — E039 Hypothyroidism, unspecified: Secondary | ICD-10-CM | POA: Diagnosis not present

## 2016-11-26 DIAGNOSIS — Z79899 Other long term (current) drug therapy: Secondary | ICD-10-CM | POA: Diagnosis not present

## 2016-11-26 DIAGNOSIS — K436 Other and unspecified ventral hernia with obstruction, without gangrene: Secondary | ICD-10-CM | POA: Diagnosis not present

## 2016-11-26 DIAGNOSIS — J45909 Unspecified asthma, uncomplicated: Secondary | ICD-10-CM | POA: Diagnosis not present

## 2016-11-26 DIAGNOSIS — Z7982 Long term (current) use of aspirin: Secondary | ICD-10-CM | POA: Diagnosis not present

## 2016-11-26 HISTORY — DX: Umbilical hernia without obstruction or gangrene: K42.9

## 2016-11-26 LAB — CBC
HCT: 42.6 % (ref 39.0–52.0)
Hemoglobin: 14.3 g/dL (ref 13.0–17.0)
MCH: 31.1 pg (ref 26.0–34.0)
MCHC: 33.6 g/dL (ref 30.0–36.0)
MCV: 92.6 fL (ref 78.0–100.0)
Platelets: 193 10*3/uL (ref 150–400)
RBC: 4.6 MIL/uL (ref 4.22–5.81)
RDW: 14.1 % (ref 11.5–15.5)
WBC: 10 10*3/uL (ref 4.0–10.5)

## 2016-11-26 LAB — COMPREHENSIVE METABOLIC PANEL
ALBUMIN: 4.5 g/dL (ref 3.5–5.0)
ALT: 20 U/L (ref 17–63)
AST: 23 U/L (ref 15–41)
Alkaline Phosphatase: 77 U/L (ref 38–126)
Anion gap: 10 (ref 5–15)
BUN: 13 mg/dL (ref 6–20)
CHLORIDE: 100 mmol/L — AB (ref 101–111)
CO2: 26 mmol/L (ref 22–32)
Calcium: 9.4 mg/dL (ref 8.9–10.3)
Creatinine, Ser: 1.1 mg/dL (ref 0.61–1.24)
GFR calc non Af Amer: >60 mL/min (ref >60)
GLUCOSE: 101 mg/dL — AB (ref 65–99)
Potassium: 4.3 mmol/L (ref 3.5–5.1)
SODIUM: 136 mmol/L (ref 135–145)
Total Bilirubin: 0.8 mg/dL (ref 0.3–1.2)
Total Protein: 8.3 g/dL — ABNORMAL HIGH (ref 6.5–8.1)

## 2016-11-26 LAB — LIPASE, BLOOD: Lipase: 33 U/L (ref 11–51)

## 2016-11-26 MED ORDER — ONDANSETRON 4 MG PO TBDP
4.0000 mg | ORAL_TABLET | Freq: Once | ORAL | Status: AC | PRN
  Administered 2016-11-26: 4 mg via ORAL
  Filled 2016-11-26: qty 1

## 2016-11-26 MED ORDER — FLUTICASONE PROPIONATE 50 MCG/ACT NA SUSP: 2.0000 | Freq: Every day | NASAL | 11 refills | Status: DC

## 2016-11-26 NOTE — Patient Instructions (Addendum)
Please report to the [facility] ER for a consult on what I suspect is an incarcerated hernia. You will be evaluated by a different group of medical providers there that will make good decisions about imaging and whether or not you need a consult with general surgery.    Hernia, Adult A hernia is the bulging of an organ or tissue through a weak spot in the muscles of the abdomen (abdominal wall). Hernias develop most often near the navel or groin. There are many kinds of hernias. Common kinds include:  Femoral hernia. This kind of hernia develops under the groin in the upper thigh area.  Inguinal hernia. This kind of hernia develops in the groin or scrotum.  Umbilical hernia. This kind of hernia develops near the navel.  Hiatal hernia. This kind of hernia causes part of the stomach to be pushed up into the chest.  Incisional hernia. This kind of hernia bulges through a scar from an abdominal surgery.  What are the causes? This condition may be caused by:  Heavy lifting.  Coughing over a long period of time.  Straining to have a bowel movement.  An incision made during an abdominal surgery.  A birth defect (congenital defect).  Excess weight or obesity.  Smoking.  Poor nutrition.  Cystic fibrosis.  Excess fluid in the abdomen.  Undescended testicles.  What are the signs or symptoms? Symptoms of a hernia include:  A lump on the abdomen. This is the first sign of a hernia. The lump may become more obvious with standing, straining, or coughing. It may get bigger over time if it is not treated or if the condition causing it is not treated.  Pain. A hernia is usually painless, but it may become painful over time if treatment is delayed. The pain is usually dull and may get worse with standing or lifting heavy objects.  Sometimes a hernia gets tightly squeezed in the weak spot (strangulated) or stuck there (incarcerated) and causes additional symptoms. These symptoms may  include:  Vomiting.  Nausea.  Constipation.  Irritability.  How is this diagnosed? A hernia may be diagnosed with:  A physical exam. During the exam your health care provider may ask you to cough or to make a specific movement, because a hernia is usually more visible when you move.  Imaging tests. These can include: ? X-rays. ? Ultrasound. ? CT scan.  How is this treated? A hernia that is small and painless may not need to be treated. A hernia that is large or painful may be treated with surgery. Inguinal hernias may be treated with surgery to prevent incarceration or strangulation. Strangulated hernias are always treated with surgery, because lack of blood to the trapped organ or tissue can cause it to die. Surgery to treat a hernia involves pushing the bulge back into place and repairing the weak part of the abdomen. Follow these instructions at home:  Avoid straining.  Do not lift anything heavier than 10 lb (4.5 kg).  Lift with your leg muscles, not your back muscles. This helps avoid strain.  When coughing, try to cough gently.  Prevent constipation. Constipation leads to straining with bowel movements, which can make a hernia worse or cause a hernia repair to break down. You can prevent constipation by: ? Eating a high-fiber diet that includes plenty of fruits and vegetables. ? Drinking enough fluids to keep your urine clear or pale yellow. Aim to drink 6-8 glasses of water per day. ? Using a  stool softener as directed by your health care provider.  Lose weight, if you are overweight.  Do not use any tobacco products, including cigarettes, chewing tobacco, or electronic cigarettes. If you need help quitting, ask your health care provider.  Keep all follow-up visits as directed by your health care provider. This is important. Your health care provider may need to monitor your condition. Contact a health care provider if:  You have swelling, redness, and pain in the  affected area.  Your bowel habits change. Get help right away if:  You have a fever.  You have abdominal pain that is getting worse.  You feel nauseous or you vomit.  You cannot push the hernia back in place by gently pressing on it while you are lying down.  The hernia: ? Changes in shape or size. ? Is stuck outside the abdomen. ? Becomes discolored. ? Feels hard or tender. This information is not intended to replace advice given to you by your health care provider. Make sure you discuss any questions you have with your health care provider. Document Released: 02/25/2005 Document Revised: 07/26/2015 Document Reviewed: 01/05/2014 Elsevier Interactive Patient Education  2017 [employer].    For joint supplement, you may try glucosamine-chondroitin.

## 2016-11-26 NOTE — Progress Notes (Signed)
  MRN: [id number] DOB: [birth year]  Subjective:   Michael Mosley is a 66 y.o. male presenting for chief complaint of Hernia  Reports 1 month history of worsening hernia of his abdomen. Patient is now having moderate-severe pain, can no longer push his hernia back in over the past 2 days, has felt significant malaise. Denies fever, n/v. He has had a bowel movement today. Has not tried medications for relief.   Michael Mosley has a current medication list which includes the following prescription(s): aspirin, fluticasone, levothyroxine, mometasone-formoterol, and prednisone. Also is allergic to hydrocodone; vitamin c; and albuterol sulfate.  Michael Mosley  has a past medical history of Asthma; Cancer ([place]); Heart murmur; History of radiation therapy (02/2005); and Thyroid disease. Also denies past surgical history.   Objective:   Vitals: BP 127/73   Pulse (!) 53   Temp 97.9 F (36.6 C) (Oral)   Resp 17   Ht 6\' 1"  (1.854 m)   Wt 220 lb (99.8 kg)   SpO2 98%   BMI 29.03 kg/m   Physical Exam  Constitutional: He is oriented to person, place, and time. He appears well-developed and well-nourished.  HENT:  Mouth/Throat: Oropharynx is clear and moist.  Cardiovascular: Normal rate, regular rhythm and intact distal pulses.  Exam reveals no gallop and no friction rub.   No murmur heard. Pulmonary/Chest: No respiratory distress. He has no wheezes. He has no rales.  Abdominal: Soft. Bowel sounds are normal. He exhibits no distension and no mass. There is tenderness (over hernia). There is no rebound and no guarding. A hernia (~3cm in diameter, firm, non-reducible with associated light blue discoloration) is present.  Neurological: He is alert and oriented to person, place, and time.  Skin: Skin is warm and dry.  Psychiatric: He has a normal mood and affect.   Assessment and Plan :   1. Umbilical hernia without obstruction and without gangrene 2. Periumbilical abdominal pain - I am concerned that the patient  has an incarcerated hernia given his physical exam findings. Vitals are stable, I offered to order a stat CT for patient but he prefers to go to ER for evaluation. He agreed to report to [facility] immediately. I called and reported case out to the triage nurse, she verbalized understanding.  3. Allergic rhinitis due to other allergic trigger, unspecified seasonality - Refill provided, patient will set up an OV to establish care. - fluticasone (FLONASE) 50 MCG/ACT nasal spray; Place 2 sprays into both nostrils daily.  Dispense: 15 g; Refill: Fisher, PA-C [facility] [phone] 11/26/2016  12:15 PM

## 2016-11-26 NOTE — ED Notes (Signed)
Called pt for vitals no response  

## 2016-11-26 NOTE — ED Triage Notes (Signed)
Pt c/o pain r/t hernia x 1 day.  Pain score 5/10.  Pt reports he was picking up limbs yesterday and bending over frequently.  Sts "I think, I felt something tear."  Pt sent by PCP to r/o incarcerated hernia.

## 2016-12-05 ENCOUNTER — Other Ambulatory Visit: Payer: Self-pay | Admitting: *Deleted

## 2016-12-05 NOTE — Patient Outreach (Addendum)
[facility]) Care Management  12/05/2016  Michael Mosley 1950/04/28 [id number]   Transition of Care Referral  Referral Date: 12/04/16 Referral Source: Urgent TOC Date of Discharge: 11/27/16 Facility: [facility] Discharge Diagnosis: Other and unspecified ventral hernia with obstruction, w/o gangrene Insurance: HTA  Outreach attempt to patient HIPAA identifiers verified with patient. Patient stated, he is doing well. He reported, he is recovering from his procedure. He stated, his surgical incision is without swelling, drainage, and soreness. Patient reported, he initially went to [facility]. Patient stated, "He waited in the emergency room for approximately 8 hours without being seen". He discussed receiving a phone call from a friend that recommended [facility]. Patient stated, "He left [facility] and went to [place], where he waited 2 hours". Patient stated, he was not admitted to the hospital. Patient stated, it was an outpatient procedure, which lasted for 5 to 6 hours. He was discharged home after recovery. He stated, there is a possibility he had a hernia repair. He did not have the opportunity to speak with the surgeon, per patient. He wasn't able to provide the details about the procedure. Patient reported, he hadn't scheduled a discharge hospital follow-up appointment. He was waiting for the Surgeon's office staff to contact him. RN CM encouraged patient to contact the Surgeon's office to schedule his appointment after ending the phone call. Patient agreed to contact the MD;s office to have his appointment scheduled. He discussed being ready to get back to baseline, which involves golf, playing drums in a band, and being active.  Patient reported, he doesn't have a primary MD. He was being followed by an Oncologist about 2 years ago. Patient plans to find a primary doctor, eventually. He stated, He;s always been in  good health and did not need a doctor. He was utilizing Urgent Care as his primary source for medical care.  Plan: RN CM will notify [facility] CM administrative assistant regarding case closure.    Lake Bells, RN, BSN, MHA/MSL, [place] Telephonic Care Manager Coordinator [facility] Direct Phone: [phone] Toll Free: [phone] Fax: [phone]

## 2016-12-12 ENCOUNTER — Encounter: Payer: Self-pay | Admitting: Family Medicine

## 2016-12-12 ENCOUNTER — Ambulatory Visit (INDEPENDENT_AMBULATORY_CARE_PROVIDER_SITE_OTHER): Payer: PPO | Admitting: Family Medicine

## 2016-12-12 ENCOUNTER — Encounter: Payer: Self-pay | Admitting: *Deleted

## 2016-12-12 VITALS — BP 110/58 | HR 56 | Temp 98.3°F | Resp 16 | Ht 73.0 in | Wt 212.8 lb

## 2016-12-12 DIAGNOSIS — Z23 Encounter for immunization: Secondary | ICD-10-CM | POA: Diagnosis not present

## 2016-12-12 DIAGNOSIS — J4530 Mild persistent asthma, uncomplicated: Secondary | ICD-10-CM | POA: Diagnosis not present

## 2016-12-12 DIAGNOSIS — J3089 Other allergic rhinitis: Secondary | ICD-10-CM | POA: Diagnosis not present

## 2016-12-12 MED ORDER — ALBUTEROL SULFATE HFA 108 (90 BASE) MCG/ACT IN AERS: 1.0000 | INHALATION_SPRAY | RESPIRATORY_TRACT | 0 refills | Status: DC | PRN

## 2016-12-12 MED ORDER — FLUTICASONE PROPIONATE 50 MCG/ACT NA SUSP: 2.0000 | Freq: Every day | NASAL | 11 refills | Status: DC

## 2016-12-12 MED ORDER — FLUTICASONE PROPIONATE HFA 44 MCG/ACT IN AERO: 2.0000 | INHALATION_SPRAY | Freq: Two times a day (BID) | RESPIRATORY_TRACT | 3 refills | Status: DC

## 2016-12-12 NOTE — Progress Notes (Signed)
Subjective:  By signing my name below, I, [name], attest that this documentation has been prepared under the direction and in the presence of [name], MD. Electronically Signed: [name], [place]. 12/12/2016 , 4:36 PM .  Patient was seen in Room 10 .   Patient ID: MONTRELL CESSNA, male    DOB: [birth year], 66 y.o.   MRN: [id number] Chief Complaint  Patient presents with  . Medication Problem    needs to change to a cheaper Asthma medication Dulera to expensive    HPI ARES CARDOZO is a 66 y.o. male Here for follow up of asthma. He was last prescribed Dulera by Dr. Linna Darner in Dec 2017. He states he was previously on [employer], and it was about $20 for it. But, recently switched to Medicare, and Dulera isn't covered and costs $85-87.   He hasn't seen his pulmonologist, Dr. Melvyn Novas, recently. He was instructed to use 2 puffs once a day, but he's been doing okay with 2 puffs every few days. He uses it when he is outside with activity (playing golf, playing drums or yard work). He also has an albuterol rescue inhaler at home, but hasn't needed to use it. He uses Flonase 2 sprays every few days, mostly as needed when he becomes too congested. He's had heart palpitations in the past with albuterol.   He has family history of heart disease in men in their mid to late 10s.   Patient Active Problem List   Diagnosis Date Noted  . Sinusitis, chronic 08/18/2014  . Mild persistent chronic asthma without complication 26/94/8546  . 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 Malignant neoplasm of base of tongue ([place]) 10/09/2011  . Hypothyroidism 10/09/2011   Past Medical History:  Diagnosis Date  . Asthma   . Cancer ([place])    tongue cancer s/p chemo/RXT, followed by Dr Sondra Come.   Marland Kitchen Heart murmur   . History of radiation therapy 02/2005   CTV 6810 cGy, PTV 6000 cGy, ETV 227 cGy  . Thyroid disease   . Umbilical hernia    Past Surgical History:  Procedure Laterality Date    . SHOULDER SURGERY Right    Allergies  Allergen Reactions  . Vitamin C Other (See Comments)    Gets cold symptoms  . Hydrocodone   . Albuterol Sulfate Palpitations   Prior to Admission medications   Medication Sig Start Date End Date Taking? Authorizing Provider  aspirin 81 MG tablet Take 81 mg by mouth daily.    [provider]  fluticasone (FLONASE) 50 MCG/ACT nasal spray Place 2 sprays into both nostrils daily. 11/26/16   Jaynee Eagles, PA-C  levothyroxine (SYNTHROID, LEVOTHROID) 75 MCG tablet Take 1 tablet (75 mcg total) by mouth daily. 03/05/16   Posey Boyer, MD  mometasone-formoterol (DULERA) 100-5 MCG/ACT AERO Inhale 2 puffs into the lungs 2 (two) times daily. 03/05/16   Posey Boyer, MD  predniSONE (DELTASONE) 20 MG tablet Take 3 daily for 2 days, then 2 for 2 days, then 1 for 2 days 03/05/16   Posey Boyer, MD   Social History   Social History  . Marital status: Divorced    Spouse name: N/A  . Number of children: N/A  . Years of education: N/A   Occupational History  . Not on file.   Social History Main Topics  . Smoking status: Never Smoker  . Smokeless tobacco: Never Used  . Alcohol use Yes     Comment: rarely  .  Drug use: No  . Sexual activity: Not on file   Other Topics Concern  . Not on file   Social History Narrative  . No narrative on file   Review of Systems  Constitutional: Negative for fatigue and unexpected weight change.  Eyes: Negative for visual disturbance.  Respiratory: Negative for cough, chest tightness and shortness of breath.   Cardiovascular: Negative for chest pain, palpitations and leg swelling.  Gastrointestinal: Negative for abdominal pain and blood in stool.  Neurological: Negative for dizziness, light-headedness and headaches.       Objective:   Physical Exam  Constitutional: He is oriented to person, place, and time. He appears well-developed and well-nourished.  HENT:  Head: Normocephalic and atraumatic.   Right Ear: Tympanic membrane, external ear and ear canal normal.  Left Ear: Tympanic membrane, external ear and ear canal normal.  Nose: No rhinorrhea.  Mouth/Throat: Oropharynx is clear and moist and mucous membranes are normal. No oropharyngeal exudate or posterior oropharyngeal erythema.  Minimal edema of turbinates  Eyes: Pupils are equal, round, and reactive to light. Conjunctivae are normal.  Neck: Neck supple.  Cardiovascular: Normal rate, regular rhythm, normal heart sounds and intact distal pulses.   No murmur heard. Pulmonary/Chest: Effort normal and breath sounds normal. He has no wheezes. He has no rhonchi. He has no rales.  Abdominal: Soft. There is no tenderness.  Lymphadenopathy:    He has no cervical adenopathy.  Neurological: He is alert and oriented to person, place, and time.  Skin: Skin is warm and dry. No rash noted.  Psychiatric: He has a normal mood and affect. His behavior is normal.  Vitals reviewed.   Vitals:   12/12/16 1559  BP: (!) 110/58  Pulse: (!) 56  Resp: 16  Temp: 98.3 F (36.8 C)  SpO2: 96%  Weight: 212 lb 12.8 oz (96.5 kg)  Height: 6\' 1"  (1.854 m)      Assessment & Plan:    LEV CERVONE is a 66 y.o. male Mild persistent chronic asthma without complication - Plan: albuterol (PROVENTIL HFA;VENTOLIN HFA) 108 (90 Base) MCG/ACT inhaler, fluticasone (FLOVENT HFA) 44 MCG/ACT inhaler  Need for prophylactic vaccination and inoculation against influenza - Plan: Flu Vaccine QUAD 6+ mos PF IM (Fluarix Quad PF)  Allergic rhinitis due to other allergic trigger, unspecified seasonality - Plan: fluticasone (FLONASE) 50 MCG/ACT nasal spray  Well controlled asthma with only intermittent use of Dulera. Likely does not need combination of ICS/LABA  - Will change to an inhaled corticosteroid alone with daily use. Start flovent HFA.   -continue albuterol if needed for breakthrough sx's. RTC precautions discussed if more frequent need for albuterol or  worsening symptoms.   -discussed allergy treatment with flonase, and mask usage around dust or other conditions that worsen his asthma.   -Recheck in 6 weeks and advised to schedule physical as I am listed as his primary care provider.  Meds ordered this encounter  Medications  . albuterol (PROVENTIL HFA;VENTOLIN HFA) 108 (90 Base) MCG/ACT inhaler    Sig: Inhale 1-2 puffs into the lungs every 4 (four) hours as needed for wheezing or shortness of breath.    Dispense:  1 Inhaler    Refill:  0  . fluticasone (FLOVENT HFA) 44 MCG/ACT inhaler    Sig: Inhale 2 puffs into the lungs 2 (two) times daily.    Dispense:  1 Inhaler    Refill:  3  . fluticasone (FLONASE) 50 MCG/ACT nasal spray    Sig: Place  2 sprays into both nostrils daily.    Dispense:  15 g    Refill:  11   Patient Instructions    Based on your current asthma control, I changed Dulera to Flovent, 2 puffs twice per day.  Use this every day as maintenance/preventative medication. If you do have breakthrough shortness of breath or wheezing, can then use 1-2 puffs of albuterol up to every 4-6 hours if needed. If you are requiring albuterol more than 2 days per week, or nighttime symptoms, let me know and we can change your daily medication.  Wear a dust mask when working outside, and use Flonase nasal spray also when you will be outside or around allergens.  Recheck in 6 weeks. Please also schedule a physical.  Thanks for coming in today.    Asthma, Adult Asthma is a recurring condition in which the airways tighten and narrow. Asthma can make it difficult to breathe. It can cause coughing, wheezing, and shortness of breath. Asthma episodes, also called asthma attacks, range from minor to life-threatening. Asthma cannot be cured, but medicines and lifestyle changes can help control it. What are the causes? Asthma is believed to be caused by inherited (genetic) and environmental factors, but its exact cause is unknown. Asthma may be  triggered by allergens, lung infections, or irritants in the air. Asthma triggers are different for each person. Common triggers include:  Animal dander.  Dust mites.  Cockroaches.  Pollen from trees or grass.  Mold.  Smoke.  Air pollutants such as dust, household cleaners, hair sprays, aerosol sprays, paint fumes, strong chemicals, or strong odors.  Cold air, weather changes, and winds (which increase molds and pollens in the air).  Strong emotional expressions such as crying or laughing hard.  Stress.  Certain medicines (such as aspirin) or types of drugs (such as beta-blockers).  Sulfites in foods and drinks. Foods and drinks that may contain sulfites include dried fruit, potato chips, and sparkling grape juice.  Infections or inflammatory conditions such as the flu, a cold, or an inflammation of the nasal membranes (rhinitis).  Gastroesophageal reflux disease (GERD).  Exercise or strenuous activity.  What are the signs or symptoms? Symptoms may occur immediately after asthma is triggered or many hours later. Symptoms include:  Wheezing.  Excessive nighttime or early morning coughing.  Frequent or severe coughing with a common cold.  Chest tightness.  Shortness of breath.  How is this diagnosed? The diagnosis of asthma is made by a review of your medical history and a physical exam. Tests may also be performed. These may include:  Lung function studies. These tests show how much air you breathe in and out.  Allergy tests.  Imaging tests such as X-rays.  How is this treated? Asthma cannot be cured, but it can usually be controlled. Treatment involves identifying and avoiding your asthma triggers. It also involves medicines. There are 2 classes of medicine used for asthma treatment:  Controller medicines. These prevent asthma symptoms from occurring. They are usually taken every day.  Reliever or rescue medicines. These quickly relieve asthma symptoms. They  are used as needed and provide short-term relief.  Your health care provider will help you create an asthma action plan. An asthma action plan is a written plan for managing and treating your asthma attacks. It includes a list of your asthma triggers and how they may be avoided. It also includes information on when medicines should be taken and when their dosage should be changed. An action  plan may also involve the use of a device called a peak flow meter. A peak flow meter measures how well the lungs are working. It helps you monitor your condition. Follow these instructions at home:  Take medicines only as directed by your health care provider. Speak with your health care provider if you have questions about how or when to take the medicines.  Use a peak flow meter as directed by your health care provider. Record and keep track of readings.  Understand and use the action plan to help minimize or stop an asthma attack without needing to seek medical care.  Control your home environment in the following ways to help prevent asthma attacks: ? Do not smoke. Avoid being exposed to secondhand smoke. ? Change your heating and air conditioning filter regularly. ? Limit your use of fireplaces and wood stoves. ? Get rid of pests (such as roaches and mice) and their droppings. ? Throw away plants if you see mold on them. ? Clean your floors and dust regularly. Use unscented cleaning products. ? Try to have someone else vacuum for you regularly. Stay out of rooms while they are being vacuumed and for a short while afterward. If you vacuum, use a dust mask from a hardware store, a double-layered or microfilter vacuum cleaner bag, or a vacuum cleaner with a HEPA filter. ? Replace carpet with wood, tile, or vinyl flooring. Carpet can trap dander and dust. ? Use allergy-proof pillows, mattress covers, and box spring covers. ? Wash bed sheets and blankets every week in hot water and dry them in a  dryer. ? Use blankets that are made of polyester or cotton. ? Clean bathrooms and kitchens with bleach. If possible, have someone repaint the walls in these rooms with mold-resistant paint. Keep out of the rooms that are being cleaned and painted. ? Wash hands frequently. Contact a health care provider if:  You have wheezing, shortness of breath, or a cough even if taking medicine to prevent attacks.  The colored mucus you cough up (sputum) is thicker than usual.  Your sputum changes from clear or white to yellow, green, gray, or bloody.  You have any problems that may be related to the medicines you are taking (such as a rash, itching, swelling, or trouble breathing).  You are using a reliever medicine more than 2-3 times per week.  Your peak flow is still at 50-79% of your personal best after following your action plan for 1 hour.  You have a fever. Get help right away if:  You seem to be getting worse and are unresponsive to treatment during an asthma attack.  You are short of breath even at rest.  You get short of breath when doing very little physical activity.  You have difficulty eating, drinking, or talking due to asthma symptoms.  You develop chest pain.  You develop a fast heartbeat.  You have a bluish color to your lips or fingernails.  You are light-headed, dizzy, or faint.  Your peak flow is less than 50% of your personal best. This information is not intended to replace advice given to you by your health care provider. Make sure you discuss any questions you have with your health care provider. Document Released: 02/25/2005 Document Revised: 08/09/2015 Document Reviewed: 09/24/2012 Elsevier Interactive Patient Education  2017 [employer].   IF you received an x-ray today, you will receive an invoice from [facility]. Please contact [facility] at [phone] with questions or concerns regarding your  invoice.   IF you received labwork  today, you will receive an invoice from [place]. Please contact [facility] at [phone] with questions or concerns regarding your invoice.   Our billing staff will not be able to assist you with questions regarding bills from these companies.  You will be contacted with the lab results as soon as they are available. The fastest way to get your results is to activate your My Chart account. Instructions are located on the last page of this paperwork. If you have not heard from [place] regarding the results in 2 weeks, please contact this office.       I personally performed the services described in this documentation, which was scribed in my presence. The recorded information has been reviewed and considered for accuracy and completeness, addended by me as needed, and agree with information above.  Signed,   [name], MD [facility].  12/13/16 4:30 PM

## 2016-12-12 NOTE — Patient Instructions (Addendum)
Based on your current asthma control, I changed Dulera to Flovent, 2 puffs twice per day.  Use this every day as maintenance/preventative medication. If you do have breakthrough shortness of breath or wheezing, can then use 1-2 puffs of albuterol up to every 4-6 hours if needed. If you are requiring albuterol more than 2 days per week, or nighttime symptoms, let me know and we can change your daily medication.  Wear a dust mask when working outside, and use Flonase nasal spray also when you will be outside or around allergens.  Recheck in 6 weeks. Please also schedule a physical.  Thanks for coming in today.    Asthma, Adult Asthma is a recurring condition in which the airways tighten and narrow. Asthma can make it difficult to breathe. It can cause coughing, wheezing, and shortness of breath. Asthma episodes, also called asthma attacks, range from minor to life-threatening. Asthma cannot be cured, but medicines and lifestyle changes can help control it. What are the causes? Asthma is believed to be caused by inherited (genetic) and environmental factors, but its exact cause is unknown. Asthma may be triggered by allergens, lung infections, or irritants in the air. Asthma triggers are different for each person. Common triggers include:  Animal dander.  Dust mites.  Cockroaches.  Pollen from trees or grass.  Mold.  Smoke.  Air pollutants such as dust, household cleaners, hair sprays, aerosol sprays, paint fumes, strong chemicals, or strong odors.  Cold air, weather changes, and winds (which increase molds and pollens in the air).  Strong emotional expressions such as crying or laughing hard.  Stress.  Certain medicines (such as aspirin) or types of drugs (such as beta-blockers).  Sulfites in foods and drinks. Foods and drinks that may contain sulfites include dried fruit, potato chips, and sparkling grape juice.  Infections or inflammatory conditions such as the flu, a cold, or  an inflammation of the nasal membranes (rhinitis).  Gastroesophageal reflux disease (GERD).  Exercise or strenuous activity.  What are the signs or symptoms? Symptoms may occur immediately after asthma is triggered or many hours later. Symptoms include:  Wheezing.  Excessive nighttime or early morning coughing.  Frequent or severe coughing with a common cold.  Chest tightness.  Shortness of breath.  How is this diagnosed? The diagnosis of asthma is made by a review of your medical history and a physical exam. Tests may also be performed. These may include:  Lung function studies. These tests show how much air you breathe in and out.  Allergy tests.  Imaging tests such as X-rays.  How is this treated? Asthma cannot be cured, but it can usually be controlled. Treatment involves identifying and avoiding your asthma triggers. It also involves medicines. There are 2 classes of medicine used for asthma treatment:  Controller medicines. These prevent asthma symptoms from occurring. They are usually taken every day.  Reliever or rescue medicines. These quickly relieve asthma symptoms. They are used as needed and provide short-term relief.  Your health care provider will help you create an asthma action plan. An asthma action plan is a written plan for managing and treating your asthma attacks. It includes a list of your asthma triggers and how they may be avoided. It also includes information on when medicines should be taken and when their dosage should be changed. An action plan may also involve the use of a device called a peak flow meter. A peak flow meter measures how well the lungs are working. It helps  you monitor your condition. Follow these instructions at home:  Take medicines only as directed by your health care provider. Speak with your health care provider if you have questions about how or when to take the medicines.  Use a peak flow meter as directed by your health care  provider. Record and keep track of readings.  Understand and use the action plan to help minimize or stop an asthma attack without needing to seek medical care.  Control your home environment in the following ways to help prevent asthma attacks: ? Do not smoke. Avoid being exposed to secondhand smoke. ? Change your heating and air conditioning filter regularly. ? Limit your use of fireplaces and wood stoves. ? Get rid of pests (such as roaches and mice) and their droppings. ? Throw away plants if you see mold on them. ? Clean your floors and dust regularly. Use unscented cleaning products. ? Try to have someone else vacuum for you regularly. Stay out of rooms while they are being vacuumed and for a short while afterward. If you vacuum, use a dust mask from a hardware store, a double-layered or microfilter vacuum cleaner bag, or a vacuum cleaner with a HEPA filter. ? Replace carpet with wood, tile, or vinyl flooring. Carpet can trap dander and dust. ? Use allergy-proof pillows, mattress covers, and box spring covers. ? Wash bed sheets and blankets every week in hot water and dry them in a dryer. ? Use blankets that are made of polyester or cotton. ? Clean bathrooms and kitchens with bleach. If possible, have someone repaint the walls in these rooms with mold-resistant paint. Keep out of the rooms that are being cleaned and painted. ? Wash hands frequently. Contact a health care provider if:  You have wheezing, shortness of breath, or a cough even if taking medicine to prevent attacks.  The colored mucus you cough up (sputum) is thicker than usual.  Your sputum changes from clear or white to yellow, green, gray, or bloody.  You have any problems that may be related to the medicines you are taking (such as a rash, itching, swelling, or trouble breathing).  You are using a reliever medicine more than 2-3 times per week.  Your peak flow is still at 50-79% of your personal best after  following your action plan for 1 hour.  You have a fever. Get help right away if:  You seem to be getting worse and are unresponsive to treatment during an asthma attack.  You are short of breath even at rest.  You get short of breath when doing very little physical activity.  You have difficulty eating, drinking, or talking due to asthma symptoms.  You develop chest pain.  You develop a fast heartbeat.  You have a bluish color to your lips or fingernails.  You are light-headed, dizzy, or faint.  Your peak flow is less than 50% of your personal best. This information is not intended to replace advice given to you by your health care provider. Make sure you discuss any questions you have with your health care provider. Document Released: 02/25/2005 Document Revised: 08/09/2015 Document Reviewed: 09/24/2012 Elsevier Interactive Patient Education  2017 [employer].   IF you received an x-ray today, you will receive an invoice from [facility]. Please contact [facility] at [phone] with questions or concerns regarding your invoice.   IF you received labwork today, you will receive an invoice from [place]. Please contact [facility] at [phone] with questions or concerns regarding your  invoice.   Our billing staff will not be able to assist you with questions regarding bills from these companies.  You will be contacted with the lab results as soon as they are available. The fastest way to get your results is to activate your My Chart account. Instructions are located on the last page of this paperwork. If you have not heard from [place] regarding the results in 2 weeks, please contact this office.

## 2016-12-23 ENCOUNTER — Encounter: Payer: Self-pay | Admitting: Family Medicine

## 2016-12-23 ENCOUNTER — Ambulatory Visit (INDEPENDENT_AMBULATORY_CARE_PROVIDER_SITE_OTHER): Payer: PPO | Admitting: Family Medicine

## 2016-12-23 VITALS — BP 130/66 | HR 64 | Temp 98.2°F | Resp 16 | Ht 73.0 in | Wt 212.0 lb

## 2016-12-23 DIAGNOSIS — S90512A Abrasion, left ankle, initial encounter: Secondary | ICD-10-CM | POA: Diagnosis not present

## 2016-12-23 DIAGNOSIS — M7989 Other specified soft tissue disorders: Secondary | ICD-10-CM | POA: Diagnosis not present

## 2016-12-23 DIAGNOSIS — M79672 Pain in left foot: Secondary | ICD-10-CM

## 2016-12-23 MED ORDER — INDOMETHACIN 50 MG PO CAPS: 50.0000 mg | ORAL_CAPSULE | Freq: Three times a day (TID) | ORAL | 0 refills | Status: DC

## 2016-12-23 MED ORDER — DOXYCYCLINE HYCLATE 100 MG PO TABS: 100.0000 mg | ORAL_TABLET | Freq: Two times a day (BID) | ORAL | 0 refills | Status: DC

## 2016-12-23 NOTE — Progress Notes (Addendum)
Subjective:  By signing my name below, I, Michael Mosley, attest that this documentation has been prepared under the direction and in the presence of [name], MD Electronically Signed: Ladene Artist, ED Scribe 12/23/2016 at 2:23 PM.   Patient ID: Michael Mosley, male    DOB: [birth year], 66 y.o.   MRN: [id number]  Chief Complaint  Patient presents with  . ankle pain    dropped a board on his left foot x2 weeks ago  . Foot Pain    has swelling around his left heel; believe it's gout   HPI  Michael Mosley is a 66 y.o. male who presents to Primary Care at [facility] complaining of L ankle and heel pain x 1.5 weeks ago. Pt states that a board fell on top of his L ankle 1.5 weeks ago and he noticed swelling, redness and pain to the L ankle at that time that resolved within a week. However, he noticed crusting, redness, swelling and pain that returned to the L heel yesterday that began while walking around. Pt does report that pain has improved since onset yesterday. No known injury. Denies wearing new shoes, fever, chills, calf pain. No h/o gout, DVT/PE, ulcers, heart disease.   Patient Active Problem List   Diagnosis Date Noted  . Sinusitis, chronic 08/18/2014  . Mild persistent chronic asthma without complication 85/27/7824  . 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 Malignant neoplasm of base of tongue ([place]) 10/09/2011  . Hypothyroidism 10/09/2011   Past Medical History:  Diagnosis Date  . Asthma   . Cancer ([place])    tongue cancer s/p chemo/RXT, followed by Dr Sondra Come.   Marland Kitchen Heart murmur   . History of radiation therapy 02/2005   CTV 6810 cGy, PTV 6000 cGy, ETV 227 cGy  . Thyroid disease   . Umbilical hernia    Past Surgical History:  Procedure Laterality Date  . SHOULDER SURGERY Right    Allergies  Allergen Reactions  . Vitamin C Other (See Comments)    Gets cold symptoms  . Hydrocodone   . Albuterol Sulfate Palpitations   Prior to Admission medications   Medication  Sig Start Date End Date Taking? Authorizing Provider  albuterol (PROVENTIL HFA;VENTOLIN HFA) 108 (90 Base) MCG/ACT inhaler Inhale 1-2 puffs into the lungs every 4 (four) hours as needed for wheezing or shortness of breath. 12/12/16   [name], MD  aspirin 81 MG tablet Take 81 mg by mouth daily.    [provider]  fluticasone (FLONASE) 50 MCG/ACT nasal spray Place 2 sprays into both nostrils daily. 12/12/16   [name], MD  fluticasone (FLOVENT HFA) 44 MCG/ACT inhaler Inhale 2 puffs into the lungs 2 (two) times daily. 12/12/16   [name], MD  levothyroxine (SYNTHROID, LEVOTHROID) 75 MCG tablet Take 1 tablet (75 mcg total) by mouth daily. 03/05/16   Posey Boyer, MD  mometasone-formoterol (DULERA) 100-5 MCG/ACT AERO Inhale 2 puffs into the lungs 2 (two) times daily. 03/05/16   Posey Boyer, MD  predniSONE (DELTASONE) 20 MG tablet Take 3 daily for 2 days, then 2 for 2 days, then 1 for 2 days 03/05/16   Posey Boyer, MD   Social History   Social History  . Marital status: Divorced    Spouse name: N/A  . Number of children: N/A  . Years of education: N/A   Occupational History  . Not on file.   Social History Main Topics  . Smoking status: Never  Smoker  . Smokeless tobacco: Never Used  . Alcohol use Yes     Comment: rarely  . Drug use: No  . Sexual activity: Not on file   Other Topics Concern  . Not on file   Social History Narrative  . No narrative on file   Review of Systems  Constitutional: Negative for chills and fever.  Musculoskeletal: Positive for arthralgias and joint swelling.  Skin: Positive for color change.      Objective:   Physical Exam  Constitutional: He is oriented to person, place, and time. He appears well-developed and well-nourished. No distress.  HENT:  Head: Normocephalic and atraumatic.  Eyes: Conjunctivae and EOM are normal.  Neck: Neck supple. No tracheal deviation present.  Cardiovascular: Normal rate.     Pulmonary/Chest: Effort normal. No respiratory distress.  Musculoskeletal: Normal range of motion.  Healing abrasion on L anterior ankle without surrounding erythema or warmth. Soft tissue swelling, warmth and erythema from his medial instep of foot, medial calcaneus with slight warmth toward saphenous that stops at upper ankle. Some slight erythema of the area as well. Some soft tissue swelling and redness into base of L foot from mid arch to mid heel. Some dry cracked skin in affect area without apparent wound. 1 slightly more erythematous area just above the heel on the medial L foot. Erythematous area measures 2 cm x 0.5 cm. No bony tenderness of the foot or ankle.   Neurological: He is alert and oriented to person, place, and time.  Skin: Skin is warm and dry.  Psychiatric: He has a normal mood and affect. His behavior is normal.  Nursing note and vitals reviewed.  Vitals:   12/23/16 1406  BP: 130/66  Pulse: 64  Resp: 16  Temp: 98.2 F (36.8 C)  TempSrc: Oral  SpO2: 97%  Weight: 212 lb (96.2 kg)  Height: 6\' 1"  (1.854 m)      Assessment & Plan:    DEMETRION WESBY is a 66 y.o. male Swelling of left foot - Plan: indomethacin (INDOCIN) 50 MG capsule, doxycycline (VIBRA-TABS) 100 MG tablet  Foot pain, left - Plan: indomethacin (INDOCIN) 50 MG capsule, doxycycline (VIBRA-TABS) 100 MG tablet  Abrasion of left ankle without infection, initial encounter - Plan: doxycycline (VIBRA-TABS) 100 MG tablet  Previous injury to ankle without apparent ankle infection at present. Onset of swelling, redness, warmth to mid foot. Previous injury, possible superficial thrombophlebitis, less likely gout without previous episode. Differential also includes cellulitis since he does have some dry/cracked skin.  -warm compresses, indomethacin 3 times a day when necessary for possible phlebitis, and indomethacin would also cover for gout.  -Cover for infection with doxycycline 100 mg twice a day,  potential side effects discussed.  -Recheck in 3 days. Sooner if worse.   At completion of visit he had questions regarding the medication changes from last visit. Discussed Dulera switch to Flovent and copied instructions from last visit on his after visit summary  Meds ordered this encounter  Medications  . indomethacin (INDOCIN) 50 MG capsule    Sig: Take 1 capsule (50 mg total) by mouth 3 (three) times daily with meals. As needed.    Dispense:  30 capsule    Refill:  0  . doxycycline (VIBRA-TABS) 100 MG tablet    Sig: Take 1 tablet (100 mg total) by mouth 2 (two) times daily.    Dispense:  20 tablet    Refill:  0   Patient Instructions    Start  indomethacin for inflammation and possible gout, although gout is less likely. Also start antibiotic twice per day for possible skin infection or cellulitis, see information on that below. Another possibility we discussed would be superficial phlebitis, especially with previous injury.  See information on that condition below. Warm compresses to the area may help if it is phlebitis, as well as the anti-inflammatory. Recheck in 3 days, sooner if worse.  Cellulitis, Adult Cellulitis is a skin infection. The infected area is usually red and tender. This condition occurs most often in the arms and lower legs. The infection can travel to the muscles, blood, and underlying tissue and become serious. It is very important to get treated for this condition. What are the causes? Cellulitis is caused by bacteria. The bacteria enter through a break in the skin, such as a cut, burn, insect bite, open sore, or crack. What increases the risk? This condition is more likely to occur in people who:  Have a weak defense system (immune system).  Have open wounds on the skin such as cuts, burns, bites, and scrapes. Bacteria can enter the body through these open wounds.  Are older.  Have diabetes.  Have a type of long-lasting (chronic) liver disease  (cirrhosis) or kidney disease.  Use IV drugs.  What are the signs or symptoms? Symptoms of this condition include:  Redness, streaking, or spotting on the skin.  Swollen area of the skin.  Tenderness or pain when an area of the skin is touched.  Warm skin.  Fever.  Chills.  Blisters.  How is this diagnosed? This condition is diagnosed based on a medical history and physical exam. You may also have tests, including:  Blood tests.  Lab tests.  Imaging tests.  How is this treated? Treatment for this condition may include:  Medicines, such as antibiotic medicines or antihistamines.  Supportive care, such as rest and application of cold or warm cloths (cold or warm compresses) to the skin.  Hospital care, if the condition is severe.  The infection usually gets better within 1-2 days of treatment. Follow these instructions at home:  Take over-the-counter and prescription medicines only as told by your health care provider.  If you were prescribed an antibiotic medicine, take it as told by your health care provider. Do not stop taking the antibiotic even if you start to feel better.  Drink enough fluid to keep your urine clear or pale yellow.  Do not touch or rub the infected area.  Raise (elevate) the infected area above the level of your heart while you are sitting or lying down.  Apply warm or cold compresses to the affected area as told by your health care provider.  Keep all follow-up visits as told by your health care provider. This is important. These visits let your health care provider make sure a more serious infection is not developing. Contact a health care provider if:  You have a fever.  Your symptoms do not improve within 1-2 days of starting treatment.  Your bone or joint underneath the infected area becomes painful after the skin has healed.  Your infection returns in the same area or another area.  You notice a swollen bump in the infected  area.  You develop new symptoms.  You have a general ill feeling (malaise) with muscle aches and pains. Get help right away if:  Your symptoms get worse.  You feel very sleepy.  You develop vomiting or diarrhea that persists.  You notice red streaks  coming from the infected area.  Your red area gets larger or turns dark in color. This information is not intended to replace advice given to you by your health care provider. Make sure you discuss any questions you have with your health care provider. Document Released: 12/05/2004 Document Revised: 07/06/2015 Document Reviewed: 01/04/2015 Elsevier Interactive Patient Education  2017 [employer]   Phlebitis Phlebitis is soreness and swelling (inflammation) of a vein. This can occur in your arms, legs, or torso (trunk), as well as deeper inside your body. Phlebitis is usually not serious when it occurs close to the surface of the body. However, it can cause serious problems when it occurs in a vein deeper inside the body. What are the causes? Phlebitis can be triggered by various things, including:  Reduced blood flow through your veins. This can happen with: ? Bed rest over a long period. ? Long-distance travel. ? Injury. ? Surgery. ? Being overweight (obese) or pregnant.  Having an IV tube put in the vein and getting certain medicines through the vein.  Cancer and cancer treatment.  Use of illegal drugs taken through the vein.  Inflammatory diseases.  Inherited (genetic) diseases that increase the risk of blood clots.  Hormone therapy, such as birth control pills.  What are the signs or symptoms?  Red, tender, swollen, and painful area on your skin. Usually, the area will be long and narrow.  Firmness along the center of the affected area. This can indicate that a blood clot has formed.  Low-grade fever. How is this diagnosed? A health care provider can usually diagnose phlebitis by examining the affected area and  asking about your symptoms. To check for infection or blood clots, your health care provider may order blood tests or an ultrasound exam of the area. Blood tests and your family history may also indicate if you have an underlying genetic disease that causes blood clots. Occasionally, a piece of tissue is taken from the body (biopsy sample) if an unusual cause of phlebitis is suspected. How is this treated? Treatment will vary depending on the severity of the condition and the area of the body affected. Treatment may include:  Use of a warm compress or heating pad.  Use of compression stockings or bandages.  Anti-inflammatory medicines.  Removal of any IV tube that may be causing the problem.  Medicines that kill germs (antibiotics) if an infection is present.  Blood-thinning medicines if a blood clot is suspected or present.  In rare cases, surgery may be needed to remove damaged sections of vein.  Follow these instructions at home:  Only take over-the-counter or prescription medicines as directed by your health care provider. Take all medicines exactly as prescribed.  Raise (elevate) the affected area above the level of your heart as directed by your health care provider.  Apply a warm compress or heating pad to the affected area as directed by your health care provider. Do not sleep with the heating pad.  Use compression stockings or bandages as directed. These will speed healing and prevent the condition from coming back.  If you are on blood thinners: ? Get follow-up blood tests as directed by your health care provider. ? Check with your health care provider before using any new medicines. ? Carry a medical alert card or wear your medical alert jewelry to show that you are on blood thinners.  For phlebitis in the legs: ? Avoid prolonged standing or bed rest. ? Keep your legs moving. Raise your  legs when sitting or lying.  Do not smoke.  Women, particularly those over the  age of 65, should consider the risks and benefits of taking the contraceptive pill. This kind of hormone treatment can increase your risk for blood clots.  Follow up with your health care provider as directed. Contact a health care provider if:  You have unusual bruising or any bleeding problems.  Your swelling or pain in the affected area is not improving.  You are on anti-inflammatory medicine, and you develop belly (abdominal) pain. Get help right away if:  You have a sudden onset of chest pain or difficulty breathing.  You have a fever or persistent symptoms for more than 2-3 days.  You have a fever and your symptoms suddenly get worse. This information is not intended to replace advice given to you by your health care provider. Make sure you discuss any questions you have with your health care provider. Document Released: 02/19/2001 Document Revised: 08/03/2015 Document Reviewed: 11/02/2012 Elsevier Interactive Patient Education  2017 [employer].   IF you received an x-ray today, you will receive an invoice from [facility]. Please contact [facility] at [phone] with questions or concerns regarding your invoice.   IF you received labwork today, you will receive an invoice from [place]. Please contact [facility] at [phone] with questions or concerns regarding your invoice.   Our billing staff will not be able to assist you with questions regarding bills from these companies.  You will be contacted with the lab results as soon as they are available. The fastest way to get your results is to activate your My Chart account. Instructions are located on the last page of this paperwork. If you have not heard from [place] regarding the results in 2 weeks, please contact this office.       I personally performed the services described in this documentation, which was scribed in my presence. The recorded information has been reviewed and considered for accuracy  and completeness, addended by me as needed, and agree with information above.  Signed,   [name], MD [facility] [place].  12/23/16 2:39 PM

## 2016-12-23 NOTE — Addendum Note (Signed)
Addended by: [name] R on: 12/23/2016 02:50 PM   Modules accepted: Orders

## 2016-12-23 NOTE — Patient Instructions (Addendum)
Start indomethacin for inflammation and possible gout, although gout is less likely. Also start antibiotic twice per day for possible skin infection or cellulitis, see information on that below. Another possibility we discussed would be superficial phlebitis, especially with previous injury.  See information on that condition below. Warm compresses to the area may help if it is phlebitis, as well as the anti-inflammatory. Recheck in 3 days, sooner if worse.   For asthma, we changed your dual aero to Flovent last visit. I have copied the instructions from last visit to help with how to use these medications:  "Based on your current asthma control, I changed Dulera to Flovent, 2 puffs twice per day.  Use this every day as maintenance/preventative medication. If you do have breakthrough shortness of breath or wheezing, can then use 1-2 puffs of albuterol up to every 4-6 hours if needed. If you are requiring albuterol more than 2 days per week, or nighttime symptoms, let me know and we can change your daily medication. Wear a dust mask when working outside, and use Flonase nasal spray also when you will be outside or around allergens."   Cellulitis, Adult Cellulitis is a skin infection. The infected area is usually red and tender. This condition occurs most often in the arms and lower legs. The infection can travel to the muscles, blood, and underlying tissue and become serious. It is very important to get treated for this condition. What are the causes? Cellulitis is caused by bacteria. The bacteria enter through a break in the skin, such as a cut, burn, insect bite, open sore, or crack. What increases the risk? This condition is more likely to occur in people who:  Have a weak defense system (immune system).  Have open wounds on the skin such as cuts, burns, bites, and scrapes. Bacteria can enter the body through these open wounds.  Are older.  Have diabetes.  Have a type of long-lasting  (chronic) liver disease (cirrhosis) or kidney disease.  Use IV drugs.  What are the signs or symptoms? Symptoms of this condition include:  Redness, streaking, or spotting on the skin.  Swollen area of the skin.  Tenderness or pain when an area of the skin is touched.  Warm skin.  Fever.  Chills.  Blisters.  How is this diagnosed? This condition is diagnosed based on a medical history and physical exam. You may also have tests, including:  Blood tests.  Lab tests.  Imaging tests.  How is this treated? Treatment for this condition may include:  Medicines, such as antibiotic medicines or antihistamines.  Supportive care, such as rest and application of cold or warm cloths (cold or warm compresses) to the skin.  Hospital care, if the condition is severe.  The infection usually gets better within 1-2 days of treatment. Follow these instructions at home:  Take over-the-counter and prescription medicines only as told by your health care provider.  If you were prescribed an antibiotic medicine, take it as told by your health care provider. Do not stop taking the antibiotic even if you start to feel better.  Drink enough fluid to keep your urine clear or pale yellow.  Do not touch or rub the infected area.  Raise (elevate) the infected area above the level of your heart while you are sitting or lying down.  Apply warm or cold compresses to the affected area as told by your health care provider.  Keep all follow-up visits as told by your health care provider. This  is important. These visits let your health care provider make sure a more serious infection is not developing. Contact a health care provider if:  You have a fever.  Your symptoms do not improve within 1-2 days of starting treatment.  Your bone or joint underneath the infected area becomes painful after the skin has healed.  Your infection returns in the same area or another area.  You notice a  swollen bump in the infected area.  You develop new symptoms.  You have a general ill feeling (malaise) with muscle aches and pains. Get help right away if:  Your symptoms get worse.  You feel very sleepy.  You develop vomiting or diarrhea that persists.  You notice red streaks coming from the infected area.  Your red area gets larger or turns dark in color. This information is not intended to replace advice given to you by your health care provider. Make sure you discuss any questions you have with your health care provider. Document Released: 12/05/2004 Document Revised: 07/06/2015 Document Reviewed: 01/04/2015 Elsevier Interactive Patient Education  2017 [employer]   Phlebitis Phlebitis is soreness and swelling (inflammation) of a vein. This can occur in your arms, legs, or torso (trunk), as well as deeper inside your body. Phlebitis is usually not serious when it occurs close to the surface of the body. However, it can cause serious problems when it occurs in a vein deeper inside the body. What are the causes? Phlebitis can be triggered by various things, including:  Reduced blood flow through your veins. This can happen with: ? Bed rest over a long period. ? Long-distance travel. ? Injury. ? Surgery. ? Being overweight (obese) or pregnant.  Having an IV tube put in the vein and getting certain medicines through the vein.  Cancer and cancer treatment.  Use of illegal drugs taken through the vein.  Inflammatory diseases.  Inherited (genetic) diseases that increase the risk of blood clots.  Hormone therapy, such as birth control pills.  What are the signs or symptoms?  Red, tender, swollen, and painful area on your skin. Usually, the area will be long and narrow.  Firmness along the center of the affected area. This can indicate that a blood clot has formed.  Low-grade fever. How is this diagnosed? A health care provider can usually diagnose phlebitis by  examining the affected area and asking about your symptoms. To check for infection or blood clots, your health care provider may order blood tests or an ultrasound exam of the area. Blood tests and your family history may also indicate if you have an underlying genetic disease that causes blood clots. Occasionally, a piece of tissue is taken from the body (biopsy sample) if an unusual cause of phlebitis is suspected. How is this treated? Treatment will vary depending on the severity of the condition and the area of the body affected. Treatment may include:  Use of a warm compress or heating pad.  Use of compression stockings or bandages.  Anti-inflammatory medicines.  Removal of any IV tube that may be causing the problem.  Medicines that kill germs (antibiotics) if an infection is present.  Blood-thinning medicines if a blood clot is suspected or present.  In rare cases, surgery may be needed to remove damaged sections of vein.  Follow these instructions at home:  Only take over-the-counter or prescription medicines as directed by your health care provider. Take all medicines exactly as prescribed.  Raise (elevate) the affected area above the level of  your heart as directed by your health care provider.  Apply a warm compress or heating pad to the affected area as directed by your health care provider. Do not sleep with the heating pad.  Use compression stockings or bandages as directed. These will speed healing and prevent the condition from coming back.  If you are on blood thinners: ? Get follow-up blood tests as directed by your health care provider. ? Check with your health care provider before using any new medicines. ? Carry a medical alert card or wear your medical alert jewelry to show that you are on blood thinners.  For phlebitis in the legs: ? Avoid prolonged standing or bed rest. ? Keep your legs moving. Raise your legs when sitting or lying.  Do not  smoke.  Women, particularly those over the age of 81, should consider the risks and benefits of taking the contraceptive pill. This kind of hormone treatment can increase your risk for blood clots.  Follow up with your health care provider as directed. Contact a health care provider if:  You have unusual bruising or any bleeding problems.  Your swelling or pain in the affected area is not improving.  You are on anti-inflammatory medicine, and you develop belly (abdominal) pain. Get help right away if:  You have a sudden onset of chest pain or difficulty breathing.  You have a fever or persistent symptoms for more than 2-3 days.  You have a fever and your symptoms suddenly get worse. This information is not intended to replace advice given to you by your health care provider. Make sure you discuss any questions you have with your health care provider. Document Released: 02/19/2001 Document Revised: 08/03/2015 Document Reviewed: 11/02/2012 Elsevier Interactive Patient Education  2017 [employer].   IF you received an x-ray today, you will receive an invoice from [facility]. Please contact [facility] at [phone] with questions or concerns regarding your invoice.   IF you received labwork today, you will receive an invoice from [place]. Please contact [facility] at [phone] with questions or concerns regarding your invoice.   Our billing staff will not be able to assist you with questions regarding bills from these companies.  You will be contacted with the lab results as soon as they are available. The fastest way to get your results is to activate your My Chart account. Instructions are located on the last page of this paperwork. If you have not heard from [place] regarding the results in 2 weeks, please contact this office.

## 2016-12-26 ENCOUNTER — Ambulatory Visit (INDEPENDENT_AMBULATORY_CARE_PROVIDER_SITE_OTHER): Payer: PPO | Admitting: Family Medicine

## 2016-12-26 ENCOUNTER — Encounter: Payer: Self-pay | Admitting: Family Medicine

## 2016-12-26 VITALS — BP 124/74 | HR 56 | Temp 97.8°F | Resp 18 | Ht 71.42 in | Wt 212.8 lb

## 2016-12-26 DIAGNOSIS — S0083XA Contusion of other part of head, initial encounter: Secondary | ICD-10-CM | POA: Diagnosis not present

## 2016-12-26 DIAGNOSIS — M7989 Other specified soft tissue disorders: Secondary | ICD-10-CM

## 2016-12-26 NOTE — Progress Notes (Signed)
Subjective:  By signing my name below, I, Michael Mosley, attest that this documentation has been prepared under the direction and in the presence of [name], MD. Electronically Signed: [name], [place]. 12/26/2016 , 5:48 PM .  Patient was seen in Room 11 .   Patient ID: Michael Mosley, male    DOB: 1950-11-10, 66 y.o.   MRN: [id number] Chief Complaint  Patient presents with  . Foot Swelling    X 4 days- f/u   HPI Michael Mosley is a 66 y.o. male  Here for follow up of foot swelling, last seen 3 days ago with left ankle and heel pain that started 1.5 week prior when a board fell on his ankle. He initially noticed swelling to the anterior ankle that improved, then acute onset on medial ankle/instep to the mid heel medially that started 4 days ago. His pain had improved some over night. He was treated with indomethacin and warm compresses for possible phlebitis, less likely gout, and doxycycline for possible cellulitis. He's a drummer.   Patient reports his swelling and pain had improved, but still has a red spot over his left ankle medially. He mentions taking both medications, indomethacin and doxycycline, but felt really anxious and jittery. His last dose of indomethacin was last night, and is still taking his antibiotics. He's also been applying heat over the area.   He also mentions lightheadedness and dizziness at 4:30AM this morning. He was waking up, getting out of bed to the bathroom to urinate, and walked into the corner of his door, hitting his left eye and left side of his face. He noticed 3 teeth started bleeding and used peroxide. He was able to eat since this acute injury. He denies any dental problems, but has some soreness. He denies double vision or dark vision. He has ongoing hip pain for about 2 years and is followed by orthopedics. He denies any difficulty with breathing. He denies any alcohol use last night. He denies nausea, vomiting, loss of consciousness,  or headaches.   Patient Active Problem List   Diagnosis Date Noted  . Sinusitis, chronic 08/18/2014  . Mild persistent chronic asthma without complication 30/86/5784  . 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 Malignant neoplasm of base of tongue ([place]) 10/09/2011  . Hypothyroidism 10/09/2011   Past Medical History:  Diagnosis Date  . Asthma   . Cancer ([place])    tongue cancer s/p chemo/RXT, followed by Dr Sondra Come.   Marland Kitchen Heart murmur   . History of radiation therapy 02/2005   CTV 6810 cGy, PTV 6000 cGy, ETV 227 cGy  . Thyroid disease   . Umbilical hernia    Past Surgical History:  Procedure Laterality Date  . SHOULDER SURGERY Right    Allergies  Allergen Reactions  . Vitamin C Other (See Comments)    Gets cold symptoms  . Hydrocodone   . Albuterol Sulfate Palpitations   Prior to Admission medications   Medication Sig Start Date End Date Taking? Authorizing Provider  albuterol (PROVENTIL HFA;VENTOLIN HFA) 108 (90 Base) MCG/ACT inhaler Inhale 1-2 puffs into the lungs every 4 (four) hours as needed for wheezing or shortness of breath. 12/12/16  Yes [name], MD  aspirin 81 MG tablet Take 81 mg by mouth daily.   Yes [provider]  doxycycline (VIBRA-TABS) 100 MG tablet Take 1 tablet (100 mg total) by mouth 2 (two) times daily. 12/23/16  Yes [name], MD  fluticasone (FLOVENT HFA) 44 MCG/ACT inhaler  Inhale 2 puffs into the lungs 2 (two) times daily. 12/12/16  Yes [name], MD  indomethacin (INDOCIN) 50 MG capsule Take 1 capsule (50 mg total) by mouth 3 (three) times daily with meals. As needed. 12/23/16  Yes [name], MD  levothyroxine (SYNTHROID, LEVOTHROID) 75 MCG tablet Take 1 tablet (75 mcg total) by mouth daily. 03/05/16  Yes Posey Boyer, MD  fluticasone (FLONASE) 50 MCG/ACT nasal spray Place 2 sprays into both nostrils daily. Patient not taking: Reported on 12/23/2016 12/12/16   [name], MD   Social History   Social History   . Marital status: Divorced    Spouse name: N/A  . Number of children: N/A  . Years of education: N/A   Occupational History  . Not on file.   Social History Main Topics  . Smoking status: Never Smoker  . Smokeless tobacco: Never Used  . Alcohol use Yes     Comment: rarely  . Drug use: No  . Sexual activity: Not on file   Other Topics Concern  . Not on file   Social History Narrative  . No narrative on file   Review of Systems  Constitutional: Negative for fatigue and unexpected weight change.  HENT: Negative for dental problem.   Eyes: Negative for visual disturbance.  Respiratory: Negative for cough, chest tightness and shortness of breath.   Cardiovascular: Negative for chest pain, palpitations and leg swelling.  Gastrointestinal: Negative for abdominal pain and Mosley in stool.  Musculoskeletal: Positive for arthralgias. Negative for gait problem and joint swelling.  Neurological: Positive for dizziness. Negative for light-headedness and headaches.       Objective:   Physical Exam  Constitutional: He is oriented to person, place, and time. He appears well-developed and well-nourished. No distress.  HENT:  Head: Normocephalic and atraumatic.  Mouth/Throat: No oral lesions. No lacerations.  No tongue laceration, no oral lesions seen, appears some gum recession in left central incisor with darkening base without active bleeding; I do not see any active gum bleeding, lateral incisor and canine appear to be intact on the upper left; slight anterior and posterior movement with palpation and slight intrusion on the left central incisor compared to the right  Eyes: Pupils are equal, round, and reactive to light. EOM are normal. Left eye exhibits no nystagmus.  Ecchymosis of the upper lid to the canthus, no active bleeding; soft tissue swelling over the left upper orbit, minimal tenderness of the maxilla, remainder of orbit non tender, pain free EOMI testing; left eye PERRL,  EOMI, no nystagmus, anterior chamber is clear  Neck: Neck supple.  Cardiovascular: Normal rate.   Pulmonary/Chest: Effort normal. No respiratory distress.  Musculoskeletal: Normal range of motion.  Possible small hematoma on the medial left foot measured at 1.5x3cm, no bony tenderness, no surrounding erythema or warmth, no appreciable swelling  Neurological: He is alert and oriented to person, place, and time.  Skin: Skin is warm and dry.  Psychiatric: He has a normal mood and affect. His behavior is normal.  Nursing note and vitals reviewed.   Vitals:   12/26/16 1712  BP: 124/74  Pulse: (!) 56  Resp: 18  Temp: 97.8 F (36.6 C)  TempSrc: Oral  SpO2: 98%  Weight: 212 lb 12.8 oz (96.5 kg)  Height: 5' 11.42" (1.814 m)      Assessment & Plan:   Michael Mosley is a 66 y.o. male Swelling of left foot  - improved. May have been  superficial phlebitis vs. Less likely gout or cellulitis. With improvement and side effects of antibiotic or indomethacin, advised to stop both at this time. rtc precautions if any worsening.   Contusion of face, initial encounter  - suspected contusion, less likely fracture. No sign of ocular muscle entrapment or red flags on exam.   - symptomatic care, but RTC/ER precautions for possible imaging if any worsening symptoms.   - may have some intrusion/dental injury with possible loose upper teeth - recommended dental eval as soon as possible.   Noted some hip pain - ongoing, no recent injury. Recommended follow up with ortho to discuss options or rtc if needed.    No orders of the defined types were placed in this encounter.  Patient Instructions   For foot - it appears to be improved today.  Based on your symptoms with the two medicines, I would recommend stopping both at this time. If any return of redness or worsening - restart doxycycline and recheck with me. Tylenol if needed for soreness, but if swelling/soreness returns - recheck foot.   Due to  injury to teeth/bleeding, and slight loose teeth,  I would recommend evaluation with dentist as soon as possible to make sure base of teeth/bone were not injured.    If any worsening pain around eye, worsening headache, or any vision changes - be seen right away or in emergency room.   Follow up with orthopaedist to discuss options for your hip pain. If acute worsening - see them sooner or can return here to discuss options here.    Tooth Injuries Tooth injuries (tooth trauma) include cracked or broken teeth (fractures), teeth that have been moved out of place or dislodged (luxations), and knocked-out teeth (avulsions). A tooth injury often needs to be treated quickly to save the tooth. However, sometimes it is not possible to save a tooth after an injury, so the tooth may need to be removed (extracted). What are the causes? Tooth injuries may be caused by any force that is strong enough to chip, break, dislodge, or knock out a tooth. Forces may be due to:  Sports injuries.  Falls.  Accidents.  Fights.  What increases the risk? You may be more likely to injure a tooth if you play a contact sport without using a mouthguard. What are the signs or symptoms? A tooth that is forced into the gum may appear dislodged or moved out of position into the tooth socket. A fractured tooth may not be as obvious. Symptoms of a tooth injury include:  Pain, especially with chewing.  A loose tooth.  Bleeding in or around the tooth.  Swelling or bruising near the tooth.  Swelling or bruising of the lip over the injured tooth.  Increased sensitivity to heat and cold.  How is this diagnosed? A tooth injury can be diagnosed with a medical history and a physical exam. You may also need dental X-rays to check for injuries to the root of the tooth. How is this treated? Treatment depends on the type of injury you have and how bad it is. Treatment may need to be done quickly to save your tooth. Possible  treatments include:  Replacing a tooth fragment with a filling, cap, or hard, protective cover (crown). This may be an option for a chip or fracture that does not involve the inside of your tooth (pulp).  Having a procedure to repair the inside of the tooth (root canal) and then having a crown placed on top.  This may be done to treat a tooth fracture that involves the pulp.  Repositioning a dislodged tooth, then doing a root canal. The root canal usually needs to be done within a few days of the injury.  Replacing a knocked-out tooth in the socket, if possible, then doing a root canal a few weeks later.  Tooth extraction for a fracture that extends below your gumline or splits your tooth completely.  Follow these instructions at home:  Take medicines only as directed by your dental provider or health care provider.  Keep all follow-up visits as directed by your dental provider or health care provider. This is important.  Do not eat or chew on very hard objects. These include ice cubes, pens, pencils, hard candy, and popcorn kernels.  Do not clench or grind your teeth. Tell your dental provider or health care provider if you grind your teeth while you sleep.  Apply ice to your mouth near the injured tooth as directed by your dental provider or health care provider.  Follow instructions about rinsing your mouth with salt water as directed by your dental provider or health care provider.  Do not use your teeth to open packages.  Always wear mouth protection when you play contact sports. Contact a health care provider if:  You continue to have tooth pain after a tooth injury.  Your tooth is sensitive to heat and cold.  You develop swelling near your injured tooth.  You have a fever.  You are unable to open your jaw.  You are drooling and it is getting worse. This information is not intended to replace advice given to you by your health care provider. Make sure you discuss any  questions you have with your health care provider. Document Released: 11/23/2003 Document Revised: 08/03/2015 Document Reviewed: 02/21/2014 Elsevier Interactive Patient Education  2018 [employer].  Facial or Scalp Contusion A facial or scalp contusion is a deep bruise (contusion) on the face or head. Injuries to the face and head generally cause a lot of swelling, especially around the eyes. Contusions are the result of an injury that caused bleeding under the skin. The contusion may turn blue, purple, or yellow. Minor injuries will give you a painless contusion, but more severe contusions may stay painful and swollen for a few weeks. What are the causes? A facial or scalp contusion is caused by a blunt injury, fall, or trauma to the face or head area. What are the signs or symptoms? Symptoms of this condition include:  Swelling of the injured area.  Discoloration of the injured area.  Tenderness, soreness, or pain in the injured area.  How is this diagnosed? This condition is diagnosed based on your medical history and a physical exam. An X-ray exam, CT scan, or MRI may be needed to check for any additional injuries, such as broken bones (fractures). How is this treated? Often, the best treatment for a facial or scalp contusion is applying cold compresses to the injured area. Over-the-counter medicines may also be recommended for pain control. Follow these instructions at home:  Take over-the-counter and prescription medicines only as told by your health care provider.  If directed, apply ice to the injured area. ? Put ice in a plastic bag. ? Place a towel between your skin and the bag. ? Leave the ice on for 20 minutes, 2-3 times a day.  Keep all follow-up visits as told by your health care provider. This is important. Contact a health care provider if:  You have trouble biting or chewing.  Your pain or swelling gets worse.  You have pain when you move your eyes. Get help  right away if:  You have severe pain or a headache that is not relieved by medicine.  You have unusual sleepiness, confusion, or personality changes.  You vomit.  You have a nosebleed that does not stop.  You have double vision or blurred vision.  You have a continuous clear fluid draining from your nose or ear.  You have trouble walking or using your arms or legs.  You have severe dizziness. Summary  A facial or scalp contusion is a deep bruise (contusion) on the face or head.  Contusions are the result of an injury that caused bleeding under the skin.  Minor injuries will give you a painless contusion, but more severe contusions may stay painful and swollen for a few weeks.  Often, the best treatment for a facial or scalp contusion is applying cold compresses to the injured area. This information is not intended to replace advice given to you by your health care provider. Make sure you discuss any questions you have with your health care provider. Document Released: 04/04/2004 Document Revised: 01/16/2016 Document Reviewed: 01/16/2016 Elsevier Interactive Patient Education  2017 [employer].   IF you received an x-ray today, you will receive an invoice from [facility] Rad[facility]. Please contact [facility] at [phone] with questions or concerns regarding your invoice.   IF you received labwork today, you will receive an invoice from [facility]. Please contact [facility] at [phone] with questions or concerns regarding your invoice.   Our billing staff will not be able to assist you with questions regarding bills from these companies.  You will be contacted with the lab results as soon as they are available. The fastest way to get your results is to activate your My Chart account. Instructions are located on the last page of this paperwork. If you have not heard from [place] regarding the results in 2 weeks, please contact this office.      I personally performed  the services described in this documentation, which was scribed in my presence. The recorded information has been reviewed and considered for accuracy and completeness, addended by me as needed, and agree with information above.  Signed,   [name], MD [facility] [place].  12/28/16 11:06 PM

## 2016-12-26 NOTE — Patient Instructions (Addendum)
For foot - it appears to be improved today.  Based on your symptoms with the two medicines, I would recommend stopping both at this time. If any return of redness or worsening - restart doxycycline and recheck with me. Tylenol if needed for soreness, but if swelling/soreness returns - recheck foot.   Due to injury to teeth/bleeding, and slight loose teeth,  I would recommend evaluation with dentist as soon as possible to make sure base of teeth/bone were not injured.    If any worsening pain around eye, worsening headache, or any vision changes - be seen right away or in emergency room.   Follow up with orthopaedist to discuss options for your hip pain. If acute worsening - see them sooner or can return here to discuss options here.    Tooth Injuries Tooth injuries (tooth trauma) include cracked or broken teeth (fractures), teeth that have been moved out of place or dislodged (luxations), and knocked-out teeth (avulsions). A tooth injury often needs to be treated quickly to save the tooth. However, sometimes it is not possible to save a tooth after an injury, so the tooth may need to be removed (extracted). What are the causes? Tooth injuries may be caused by any force that is strong enough to chip, break, dislodge, or knock out a tooth. Forces may be due to:  Sports injuries.  Falls.  Accidents.  Fights.  What increases the risk? You may be more likely to injure a tooth if you play a contact sport without using a mouthguard. What are the signs or symptoms? A tooth that is forced into the gum may appear dislodged or moved out of position into the tooth socket. A fractured tooth may not be as obvious. Symptoms of a tooth injury include:  Pain, especially with chewing.  A loose tooth.  Bleeding in or around the tooth.  Swelling or bruising near the tooth.  Swelling or bruising of the lip over the injured tooth.  Increased sensitivity to heat and cold.  How is this diagnosed? A  tooth injury can be diagnosed with a medical history and a physical exam. You may also need dental X-rays to check for injuries to the root of the tooth. How is this treated? Treatment depends on the type of injury you have and how bad it is. Treatment may need to be done quickly to save your tooth. Possible treatments include:  Replacing a tooth fragment with a filling, cap, or hard, protective cover (crown). This may be an option for a chip or fracture that does not involve the inside of your tooth (pulp).  Having a procedure to repair the inside of the tooth (root canal) and then having a crown placed on top. This may be done to treat a tooth fracture that involves the pulp.  Repositioning a dislodged tooth, then doing a root canal. The root canal usually needs to be done within a few days of the injury.  Replacing a knocked-out tooth in the socket, if possible, then doing a root canal a few weeks later.  Tooth extraction for a fracture that extends below your gumline or splits your tooth completely.  Follow these instructions at home:  Take medicines only as directed by your dental provider or health care provider.  Keep all follow-up visits as directed by your dental provider or health care provider. This is important.  Do not eat or chew on very hard objects. These include ice cubes, pens, pencils, hard candy, and popcorn kernels.  Do not clench or grind your teeth. Tell your dental provider or health care provider if you grind your teeth while you sleep.  Apply ice to your mouth near the injured tooth as directed by your dental provider or health care provider.  Follow instructions about rinsing your mouth with salt water as directed by your dental provider or health care provider.  Do not use your teeth to open packages.  Always wear mouth protection when you play contact sports. Contact a health care provider if:  You continue to have tooth pain after a tooth injury.  Your  tooth is sensitive to heat and cold.  You develop swelling near your injured tooth.  You have a fever.  You are unable to open your jaw.  You are drooling and it is getting worse. This information is not intended to replace advice given to you by your health care provider. Make sure you discuss any questions you have with your health care provider. Document Released: 11/23/2003 Document Revised: 08/03/2015 Document Reviewed: 02/21/2014 Elsevier Interactive Patient Education  2018 [place].  Facial or Scalp Contusion A facial or scalp contusion is a deep bruise (contusion) on the face or head. Injuries to the face and head generally cause a lot of swelling, especially around the eyes. Contusions are the result of an injury that caused bleeding under the skin. The contusion may turn blue, purple, or yellow. Minor injuries will give you a painless contusion, but more severe contusions may stay painful and swollen for a few weeks. What are the causes? A facial or scalp contusion is caused by a blunt injury, fall, or trauma to the face or head area. What are the signs or symptoms? Symptoms of this condition include:  Swelling of the injured area.  Discoloration of the injured area.  Tenderness, soreness, or pain in the injured area.  How is this diagnosed? This condition is diagnosed based on your medical history and a physical exam. An X-ray exam, CT scan, or MRI may be needed to check for any additional injuries, such as broken bones (fractures). How is this treated? Often, the best treatment for a facial or scalp contusion is applying cold compresses to the injured area. Over-the-counter medicines may also be recommended for pain control. Follow these instructions at home:  Take over-the-counter and prescription medicines only as told by your health care provider.  If directed, apply ice to the injured area. ? Put ice in a plastic bag. ? Place a towel between your skin and the  bag. ? Leave the ice on for 20 minutes, 2-3 times a day.  Keep all follow-up visits as told by your health care provider. This is important. Contact a health care provider if:  You have trouble biting or chewing.  Your pain or swelling gets worse.  You have pain when you move your eyes. Get help right away if:  You have severe pain or a headache that is not relieved by medicine.  You have unusual sleepiness, confusion, or personality changes.  You vomit.  You have a nosebleed that does not stop.  You have double vision or blurred vision.  You have a continuous clear fluid draining from your nose or ear.  You have trouble walking or using your arms or legs.  You have severe dizziness. Summary  A facial or scalp contusion is a deep bruise (contusion) on the face or head.  Contusions are the result of an injury that caused bleeding under the skin.  Minor injuries will give you a painless contusion, but more severe contusions may stay painful and swollen for a few weeks.  Often, the best treatment for a facial or scalp contusion is applying cold compresses to the injured area. This information is not intended to replace advice given to you by your health care provider. Make sure you discuss any questions you have with your health care provider. Document Released: 04/04/2004 Document Revised: 01/16/2016 Document Reviewed: 01/16/2016 Elsevier Interactive Patient Education  2017 [employer].   IF you received an x-ray today, you will receive an invoice from [facility] Radiology. Please contact [facility] Radiology at [phone] with questions or concerns regarding your invoice.   IF you received labwork today, you will receive an invoice from [place]. Please contact [facility] at [phone] with questions or concerns regarding your invoice.   Our billing staff will not be able to assist you with questions regarding bills from these companies.  You will be contacted with  the lab results as soon as they are available. The fastest way to get your results is to activate your My Chart account. Instructions are located on the last page of this paperwork. If you have not heard from [place] regarding the results in 2 weeks, please contact this office.

## 2016-12-28 ENCOUNTER — Encounter: Payer: Self-pay | Admitting: Family Medicine

## 2017-01-23 ENCOUNTER — Other Ambulatory Visit: Payer: Self-pay

## 2017-01-23 ENCOUNTER — Ambulatory Visit: Payer: PPO | Admitting: Family Medicine

## 2017-01-23 ENCOUNTER — Encounter: Payer: Self-pay | Admitting: Family Medicine

## 2017-01-23 VITALS — BP 102/68 | HR 60 | Temp 97.7°F | Resp 18 | Ht 71.42 in | Wt 209.8 lb

## 2017-01-23 DIAGNOSIS — J4540 Moderate persistent asthma, uncomplicated: Secondary | ICD-10-CM

## 2017-01-23 NOTE — Patient Instructions (Addendum)
Use the flovent 2 puffs twice per day EVERY day. Albuterol inhlaer (rescue) can still be taken as needed up to every 4 hours for shortness of breath or wheezing. If you still need albuterol more than once per day into next week, then call and we can change you back to [facility]. Return to the clinic or go to the nearest emergency room if any of your symptoms worsen or new symptoms occur.    Asthma, Adult Asthma is a recurring condition in which the airways tighten and narrow. Asthma can make it difficult to breathe. It can cause coughing, wheezing, and shortness of breath. Asthma episodes, also called asthma attacks, range from minor to life-threatening. Asthma cannot be cured, but medicines and lifestyle changes can help control it. What are the causes? Asthma is believed to be caused by inherited (genetic) and environmental factors, but its exact cause is unknown. Asthma may be triggered by allergens, lung infections, or irritants in the air. Asthma triggers are different for each person. Common triggers include:  Animal dander.  Dust mites.  Cockroaches.  Pollen from trees or grass.  Mold.  Smoke.  Air pollutants such as dust, household cleaners, hair sprays, aerosol sprays, paint fumes, strong chemicals, or strong odors.  Cold air, weather changes, and winds (which increase molds and pollens in the air).  Strong emotional expressions such as crying or laughing hard.  Stress.  Certain medicines (such as aspirin) or types of drugs (such as beta-blockers).  Sulfites in foods and drinks. Foods and drinks that may contain sulfites include dried fruit, potato chips, and sparkling grape juice.  Infections or inflammatory conditions such as the flu, a cold, or an inflammation of the nasal membranes (rhinitis).  Gastroesophageal reflux disease (GERD).  Exercise or strenuous activity.  What are the signs or symptoms? Symptoms may occur immediately after asthma is triggered or many  hours later. Symptoms include:  Wheezing.  Excessive nighttime or early morning coughing.  Frequent or severe coughing with a common cold.  Chest tightness.  Shortness of breath.  How is this diagnosed? The diagnosis of asthma is made by a review of your medical history and a physical exam. Tests may also be performed. These may include:  Lung function studies. These tests show how much air you breathe in and out.  Allergy tests.  Imaging tests such as X-rays.  How is this treated? Asthma cannot be cured, but it can usually be controlled. Treatment involves identifying and avoiding your asthma triggers. It also involves medicines. There are 2 classes of medicine used for asthma treatment:  Controller medicines. These prevent asthma symptoms from occurring. They are usually taken every day.  Reliever or rescue medicines. These quickly relieve asthma symptoms. They are used as needed and provide short-term relief.  Your health care provider will help you create an asthma action plan. An asthma action plan is a written plan for managing and treating your asthma attacks. It includes a list of your asthma triggers and how they may be avoided. It also includes information on when medicines should be taken and when their dosage should be changed. An action plan may also involve the use of a device called a peak flow meter. A peak flow meter measures how well the lungs are working. It helps you monitor your condition. Follow these instructions at home:  Take medicines only as directed by your health care provider. Speak with your health care provider if you have questions about how or when to take  the medicines.  Use a peak flow meter as directed by your health care provider. Record and keep track of readings.  Understand and use the action plan to help minimize or stop an asthma attack without needing to seek medical care.  Control your home environment in the following ways to help  prevent asthma attacks: ? Do not smoke. Avoid being exposed to secondhand smoke. ? Change your heating and air conditioning filter regularly. ? Limit your use of fireplaces and wood stoves. ? Get rid of pests (such as roaches and mice) and their droppings. ? Throw away plants if you see mold on them. ? Clean your floors and dust regularly. Use unscented cleaning products. ? Try to have someone else vacuum for you regularly. Stay out of rooms while they are being vacuumed and for a short while afterward. If you vacuum, use a dust mask from a hardware store, a double-layered or microfilter vacuum cleaner bag, or a vacuum cleaner with a HEPA filter. ? Replace carpet with wood, tile, or vinyl flooring. Carpet can trap dander and dust. ? Use allergy-proof pillows, mattress covers, and box spring covers. ? Wash bed sheets and blankets every week in hot water and dry them in a dryer. ? Use blankets that are made of polyester or cotton. ? Clean bathrooms and kitchens with bleach. If possible, have someone repaint the walls in these rooms with mold-resistant paint. Keep out of the rooms that are being cleaned and painted. ? Wash hands frequently. Contact a health care provider if:  You have wheezing, shortness of breath, or a cough even if taking medicine to prevent attacks.  The colored mucus you cough up (sputum) is thicker than usual.  Your sputum changes from clear or white to yellow, green, gray, or bloody.  You have any problems that may be related to the medicines you are taking (such as a rash, itching, swelling, or trouble breathing).  You are using a reliever medicine more than 2-3 times per week.  Your peak flow is still at 50-79% of your personal best after following your action plan for 1 hour.  You have a fever. Get help right away if:  You seem to be getting worse and are unresponsive to treatment during an asthma attack.  You are short of breath even at rest.  You get short  of breath when doing very little physical activity.  You have difficulty eating, drinking, or talking due to asthma symptoms.  You develop chest pain.  You develop a fast heartbeat.  You have a bluish color to your lips or fingernails.  You are light-headed, dizzy, or faint.  Your peak flow is less than 50% of your personal best. This information is not intended to replace advice given to you by your health care provider. Make sure you discuss any questions you have with your health care provider. Document Released: 02/25/2005 Document Revised: 08/09/2015 Document Reviewed: 09/24/2012 Elsevier Interactive Patient Education  2017 [employer].   IF you received an x-ray today, you will receive an invoice from [facility]. Please contact [facility] at [phone] with questions or concerns regarding your invoice.   IF you received labwork today, you will receive an invoice from [place]. Please contact [facility] at [phone] with questions or concerns regarding your invoice.   Our billing staff will not be able to assist you with questions regarding bills from these companies.  You will be contacted with the lab results as soon as they are available. The  fastest way to get your results is to activate your My Chart account. Instructions are located on the last page of this paperwork. If you have not heard from [place] regarding the results in 2 weeks, please contact this office.

## 2017-01-23 NOTE — Progress Notes (Signed)
Subjective:  By signing my name below, I, Michael Mosley, attest that this documentation has been prepared under the direction and in the presence of [name], MD Electronically Signed: Ladene Artist, ED Scribe 01/23/2017 at 10:46 AM.   Patient ID: Michael Mosley, male    DOB: 29-Oct-1950, 66 y.o.   MRN: [id number]  Chief Complaint  Patient presents with  . Asthma    per patient, inhaler is not helping   HPI Michael Mosley is a 66 y.o. male who presents to Primary Care at [facility] to discuss medications. H/o mild persistent asthma. Previously seen by Dr. Melvyn Novas. When discussed 10/4 he was using Dulera 2 puffs very few days primarily with outside activity. Had albuterol but wasn't requiring use frequently. Does not some palpitation when he does use albuterol. Based on his level of control, decided to try Flovent as just ICS treatment and trial off the LABA. Dust mask use also discussed to minimize flares.   Pt states he used to only take 2 puffs of Dulera every 3 days. He was taking Albuterol every 4 hours approximately qid until he realized that he was supposed to be taking 2 puffs of Flovent bid which he started yesterday. Last use of albuterol was yesterday. He has been experiencing some wheezing and states he has not been sleeping much do to symptoms, but symptoms do not wake him from sleep. Pt is now at moderate, persistent asthma. Denies fever.  Patient Active Problem List   Diagnosis Date Noted  . Sinusitis, chronic 08/18/2014  . Mild persistent chronic asthma without complication 49/67/5916  . 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 Malignant neoplasm of base of tongue ([place]) 10/09/2011  . Hypothyroidism 10/09/2011   Past Medical History:  Diagnosis Date  . Asthma   . Cancer ([place])    tongue cancer s/p chemo/RXT, followed by Dr Sondra Come.   Marland Kitchen Heart murmur   . History of radiation therapy 02/2005   CTV 6810 cGy, PTV 6000 cGy, ETV 227 cGy  . Thyroid disease   . Umbilical  hernia    Past Surgical History:  Procedure Laterality Date  . SHOULDER SURGERY Right    Allergies  Allergen Reactions  . Vitamin C Other (See Comments)    Gets cold symptoms  . Hydrocodone   . Albuterol Sulfate Palpitations   Prior to Admission medications   Medication Sig Start Date End Date Taking? Authorizing Provider  albuterol (PROVENTIL HFA;VENTOLIN HFA) 108 (90 Base) MCG/ACT inhaler Inhale 1-2 puffs into the lungs every 4 (four) hours as needed for wheezing or shortness of breath. 12/12/16  Yes [name], MD  aspirin 81 MG tablet Take 81 mg by mouth daily.   Yes [provider]  fluticasone (FLONASE) 50 MCG/ACT nasal spray Place 2 sprays into both nostrils daily. 12/12/16  Yes [name], MD  fluticasone (FLOVENT HFA) 44 MCG/ACT inhaler Inhale 2 puffs into the lungs 2 (two) times daily. 12/12/16  Yes [name], MD  levothyroxine (SYNTHROID, LEVOTHROID) 75 MCG tablet Take 1 tablet (75 mcg total) by mouth daily. 03/05/16  Yes Posey Boyer, MD  doxycycline (VIBRA-TABS) 100 MG tablet Take 1 tablet (100 mg total) by mouth 2 (two) times daily. Patient not taking: Reported on 01/23/2017 12/23/16   [name], MD  indomethacin (INDOCIN) 50 MG capsule Take 1 capsule (50 mg total) by mouth 3 (three) times daily with meals. As needed. Patient not taking: Reported on 01/23/2017 12/23/16   [name]  R, MD   Social History   Socioeconomic History  . Marital status: Divorced    Spouse name: Not on file  . Number of children: Not on file  . Years of education: Not on file  . Highest education level: Not on file  Social Needs  . Financial resource strain: Not on file  . Food insecurity - worry: Not on file  . Food insecurity - inability: Not on file  . Transportation needs - medical: Not on file  . Transportation needs - non-medical: Not on file  Occupational History  . Not on file  Tobacco Use  . Smoking status: Never Smoker  . Smokeless  tobacco: Never Used  Substance and Sexual Activity  . Alcohol use: Yes    Comment: rarely  . Drug use: No  . Sexual activity: Not on file  Other Topics Concern  . Not on file  Social History Narrative  . Not on file   Review of Systems  Constitutional: Negative for fever.  Respiratory: Positive for wheezing.       Objective:   Physical Exam  Constitutional: He is oriented to person, place, and time. He appears well-developed and well-nourished.  HENT:  Head: Normocephalic and atraumatic.  Right Ear: Tympanic membrane, external ear and ear canal normal.  Left Ear: Tympanic membrane, external ear and ear canal normal.  Nose: No rhinorrhea.  Mouth/Throat: Oropharynx is clear and moist and mucous membranes are normal. No oropharyngeal exudate or posterior oropharyngeal erythema.  Eyes: Conjunctivae are normal. Pupils are equal, round, and reactive to light.  Neck: Neck supple.  Cardiovascular: Normal rate, regular rhythm, normal heart sounds and intact distal pulses.  No murmur heard. Pulmonary/Chest: Effort normal. He has wheezes (Very faint wheeze on forced expiration, otherwise clear). He has no rhonchi. He has no rales.  Abdominal: Soft. There is no tenderness.  Lymphadenopathy:    He has no cervical adenopathy.  Neurological: He is alert and oriented to person, place, and time.  Skin: Skin is warm and dry. No rash noted.  Psychiatric: He has a normal mood and affect. His behavior is normal.  Vitals reviewed.  Vitals:   01/23/17 1020  BP: 102/68  Pulse: 60  Resp: 18  Temp: 97.7 F (36.5 C)  TempSrc: Oral  SpO2: 98%  Weight: 209 lb 12.8 oz (95.2 kg)  Height: 5' 11.42" (1.814 m)      Assessment & Plan:   Michael Mosley is a 66 y.o. male Moderate persistent asthma, unspecified whether complicated  Now at moderate persistent based on requirement for albuterol. There was some confusion regarding his daily/maintenance medication when changing from [facility] to [place].  Has only been on Flovent for 2 days.  - Options discussed including returning to [facility] for now, or trying to see if Flovent will provide sufficient relief of symptoms as only use to [facility] intermittently previously. He would like to try Flovent for a little bit longer.   -Continue albuterol as needed for breakthrough symptoms  - If still not controlled into next week, would either increase dose of Flovent or switch back to [facility]. He will call with that information.  - RTC precautions given.   No orders of the defined types were placed in this encounter.  Patient Instructions    Use the flovent 2 puffs twice per day EVERY day. Albuterol inhlaer (rescue) can still be taken as needed up to every 4 hours for shortness of breath or wheezing. If you still need albuterol more  than once per day into next week, then call and we can change you back to [facility]. Return to the clinic or go to the nearest emergency room if any of your symptoms worsen or new symptoms occur.    Asthma, Adult Asthma is a recurring condition in which the airways tighten and narrow. Asthma can make it difficult to breathe. It can cause coughing, wheezing, and shortness of breath. Asthma episodes, also called asthma attacks, range from minor to life-threatening. Asthma cannot be cured, but medicines and lifestyle changes can help control it. What are the causes? Asthma is believed to be caused by inherited (genetic) and environmental factors, but its exact cause is unknown. Asthma may be triggered by allergens, lung infections, or irritants in the air. Asthma triggers are different for each person. Common triggers include:  Animal dander.  Dust mites.  Cockroaches.  Pollen from trees or grass.  Mold.  Smoke.  Air pollutants such as dust, household cleaners, hair sprays, aerosol sprays, paint fumes, strong chemicals, or strong odors.  Cold air, weather changes, and winds (which increase molds and pollens in the  air).  Strong emotional expressions such as crying or laughing hard.  Stress.  Certain medicines (such as aspirin) or types of drugs (such as beta-blockers).  Sulfites in foods and drinks. Foods and drinks that may contain sulfites include dried fruit, potato chips, and sparkling grape juice.  Infections or inflammatory conditions such as the flu, a cold, or an inflammation of the nasal membranes (rhinitis).  Gastroesophageal reflux disease (GERD).  Exercise or strenuous activity.  What are the signs or symptoms? Symptoms may occur immediately after asthma is triggered or many hours later. Symptoms include:  Wheezing.  Excessive nighttime or early morning coughing.  Frequent or severe coughing with a common cold.  Chest tightness.  Shortness of breath.  How is this diagnosed? The diagnosis of asthma is made by a review of your medical history and a physical exam. Tests may also be performed. These may include:  Lung function studies. These tests show how much air you breathe in and out.  Allergy tests.  Imaging tests such as X-rays.  How is this treated? Asthma cannot be cured, but it can usually be controlled. Treatment involves identifying and avoiding your asthma triggers. It also involves medicines. There are 2 classes of medicine used for asthma treatment:  Controller medicines. These prevent asthma symptoms from occurring. They are usually taken every day.  Reliever or rescue medicines. These quickly relieve asthma symptoms. They are used as needed and provide short-term relief.  Your health care provider will help you create an asthma action plan. An asthma action plan is a written plan for managing and treating your asthma attacks. It includes a list of your asthma triggers and how they may be avoided. It also includes information on when medicines should be taken and when their dosage should be changed. An action plan may also involve the use of a device called a  peak flow meter. A peak flow meter measures how well the lungs are working. It helps you monitor your condition. Follow these instructions at home:  Take medicines only as directed by your health care provider. Speak with your health care provider if you have questions about how or when to take the medicines.  Use a peak flow meter as directed by your health care provider. Record and keep track of readings.  Understand and use the action plan to help minimize or stop an asthma  attack without needing to seek medical care.  Control your home environment in the following ways to help prevent asthma attacks: ? Do not smoke. Avoid being exposed to secondhand smoke. ? Change your heating and air conditioning filter regularly. ? Limit your use of fireplaces and wood stoves. ? Get rid of pests (such as roaches and mice) and their droppings. ? Throw away plants if you see mold on them. ? Clean your floors and dust regularly. Use unscented cleaning products. ? Try to have someone else vacuum for you regularly. Stay out of rooms while they are being vacuumed and for a short while afterward. If you vacuum, use a dust mask from a hardware store, a double-layered or microfilter vacuum cleaner bag, or a vacuum cleaner with a HEPA filter. ? Replace carpet with wood, tile, or vinyl flooring. Carpet can trap dander and dust. ? Use allergy-proof pillows, mattress covers, and box spring covers. ? Wash bed sheets and blankets every week in hot water and dry them in a dryer. ? Use blankets that are made of polyester or cotton. ? Clean bathrooms and kitchens with bleach. If possible, have someone repaint the walls in these rooms with mold-resistant paint. Keep out of the rooms that are being cleaned and painted. ? Wash hands frequently. Contact a health care provider if:  You have wheezing, shortness of breath, or a cough even if taking medicine to prevent attacks.  The colored mucus you cough up (sputum) is  thicker than usual.  Your sputum changes from clear or white to yellow, green, gray, or bloody.  You have any problems that may be related to the medicines you are taking (such as a rash, itching, swelling, or trouble breathing).  You are using a reliever medicine more than 2-3 times per week.  Your peak flow is still at 50-79% of your personal best after following your action plan for 1 hour.  You have a fever. Get help right away if:  You seem to be getting worse and are unresponsive to treatment during an asthma attack.  You are short of breath even at rest.  You get short of breath when doing very little physical activity.  You have difficulty eating, drinking, or talking due to asthma symptoms.  You develop chest pain.  You develop a fast heartbeat.  You have a bluish color to your lips or fingernails.  You are light-headed, dizzy, or faint.  Your peak flow is less than 50% of your personal best. This information is not intended to replace advice given to you by your health care provider. Make sure you discuss any questions you have with your health care provider. Document Released: 02/25/2005 Document Revised: 08/09/2015 Document Reviewed: 09/24/2012 Elsevier Interactive Patient Education  2017 [employer].   IF you received an x-ray today, you will receive an invoice from [facility]. Please contact [facility] at [phone] with questions or concerns regarding your invoice.   IF you received labwork today, you will receive an invoice from [place]. Please contact [facility] at [phone] with questions or concerns regarding your invoice.   Our billing staff will not be able to assist you with questions regarding bills from these companies.  You will be contacted with the lab results as soon as they are available. The fastest way to get your results is to activate your My Chart account. Instructions are located on the last page of this paperwork.  If you have not heard from [place] regarding the results in 2  weeks, please contact this office.      I personally performed the services described in this documentation, which was scribed in my presence. The recorded information has been reviewed and considered for accuracy and completeness, addended by me as needed, and agree with information above.  Signed,   [name], MD Primary Care at [place].  01/23/17 11:14 AM

## 2017-03-03 ENCOUNTER — Ambulatory Visit (INDEPENDENT_AMBULATORY_CARE_PROVIDER_SITE_OTHER): Payer: PPO | Admitting: Family Medicine

## 2017-03-03 ENCOUNTER — Ambulatory Visit (INDEPENDENT_AMBULATORY_CARE_PROVIDER_SITE_OTHER): Payer: PPO

## 2017-03-03 ENCOUNTER — Encounter: Payer: Self-pay | Admitting: Family Medicine

## 2017-03-03 ENCOUNTER — Other Ambulatory Visit: Payer: Self-pay

## 2017-03-03 VITALS — BP 110/76 | HR 61 | Temp 98.2°F | Resp 18 | Ht 71.42 in | Wt 209.2 lb

## 2017-03-03 DIAGNOSIS — R5383 Other fatigue: Secondary | ICD-10-CM

## 2017-03-03 DIAGNOSIS — R059 Cough, unspecified: Secondary | ICD-10-CM

## 2017-03-03 DIAGNOSIS — R350 Frequency of micturition: Secondary | ICD-10-CM

## 2017-03-03 DIAGNOSIS — Z1211 Encounter for screening for malignant neoplasm of colon: Secondary | ICD-10-CM | POA: Diagnosis not present

## 2017-03-03 DIAGNOSIS — J309 Allergic rhinitis, unspecified: Secondary | ICD-10-CM | POA: Diagnosis not present

## 2017-03-03 DIAGNOSIS — Z13220 Encounter for screening for lipoid disorders: Secondary | ICD-10-CM | POA: Diagnosis not present

## 2017-03-03 DIAGNOSIS — E039 Hypothyroidism, unspecified: Secondary | ICD-10-CM

## 2017-03-03 DIAGNOSIS — R05 Cough: Secondary | ICD-10-CM

## 2017-03-03 DIAGNOSIS — Z23 Encounter for immunization: Secondary | ICD-10-CM

## 2017-03-03 DIAGNOSIS — J4541 Moderate persistent asthma with (acute) exacerbation: Secondary | ICD-10-CM

## 2017-03-03 DIAGNOSIS — R079 Chest pain, unspecified: Secondary | ICD-10-CM | POA: Diagnosis not present

## 2017-03-03 DIAGNOSIS — Z0000 Encounter for general adult medical examination without abnormal findings: Secondary | ICD-10-CM

## 2017-03-03 DIAGNOSIS — R634 Abnormal weight loss: Secondary | ICD-10-CM | POA: Diagnosis not present

## 2017-03-03 LAB — POC MICROSCOPIC URINALYSIS (UMFC): Mucus: ABSENT

## 2017-03-03 LAB — POCT URINALYSIS DIP (MANUAL ENTRY)
BILIRUBIN UA: NEGATIVE mg/dL
Bilirubin, UA: NEGATIVE
Glucose, UA: NEGATIVE mg/dL
LEUKOCYTES UA: NEGATIVE
Nitrite, UA: NEGATIVE
PH UA: 5 (ref 5.0–8.0)
PROTEIN UA: NEGATIVE mg/dL
SPEC GRAV UA: 1.02 (ref 1.010–1.025)
UROBILINOGEN UA: 0.2 U/dL

## 2017-03-03 LAB — GLUCOSE, POCT (MANUAL RESULT ENTRY): POC GLUCOSE: 99 mg/dL (ref 70–99)

## 2017-03-03 MED ORDER — ZOSTER VAC RECOMB ADJUVANTED 50 MCG/0.5ML IM SUSR: 0.5000 mL | Freq: Once | INTRAMUSCULAR | 1 refills | Status: AC

## 2017-03-03 MED ORDER — MOMETASONE FURO-FORMOTEROL FUM 100-5 MCG/ACT IN AERO: 2.0000 | INHALATION_SPRAY | Freq: Every day | RESPIRATORY_TRACT | 3 refills | Status: DC

## 2017-03-03 MED ORDER — LEVOTHYROXINE SODIUM 75 MCG PO TABS
75.0000 ug | ORAL_TABLET | Freq: Every day | ORAL | 3 refills | Status: DC
Start: 2017-03-03 — End: 2018-02-27

## 2017-03-03 NOTE — Patient Instructions (Addendum)
Change back to [facility] for asthma. Albuterol if needed for wheezing or shortness of breath. If not improving with change back to [facility] in next 2-3 days please return for recheck to see if prednisone or antibiotic may be needed.   I will refer you to cardiology, but if any new or worsening chest pains,  call 911 or go to emergency room. Discuss the dizziness with standing with cardiology as well as further at our next visit. Make sure you are drinking sufficient water throughout the day. Return to the clinic or go to the nearest emergency room if any of your symptoms worsen or new symptoms occur.  I will check prostate test and urine test, but please follow up to discuss those symptoms further.   Follow up with orthopaedist for hip pain. We can discuss that further if needed at next visit.   Continue flonase, but we can can discuss runny nose further at next visit.   I recommend ophthalmologist or optometrist appointment for other eye screening.   Please follow up to discuss irritation, concentration, and sleep issues further.   Please follow up to discuss weight loss further once we have your labs back.   Return to the clinic or go to the nearest emergency room if any of your symptoms worsen or new symptoms occur.   Preventive Care 65 Years and Older, Male Preventive care refers to lifestyle choices and visits with your health care provider that can promote health and wellness. What does preventive care include?  A yearly physical exam. This is also called an annual well check.  Dental exams once or twice a year.  Routine eye exams. Ask your health care provider how often you should have your eyes checked.  Personal lifestyle choices, including: ? Daily care of your teeth and gums. ? Regular physical activity. ? Eating a healthy diet. ? Avoiding tobacco and drug use. ? Limiting alcohol use. ? Practicing safe sex. ? Taking low doses of aspirin every day. ? Taking vitamin and  mineral supplements as recommended by your health care provider. What happens during an annual well check? The services and screenings done by your health care provider during your annual well check will depend on your age, overall health, lifestyle risk factors, and family history of disease. Counseling Your health care provider may ask you questions about your:  Alcohol use.  Tobacco use.  Drug use.  Emotional well-being.  Home and relationship well-being.  Sexual activity.  Eating habits.  History of falls.  Memory and ability to understand (cognition).  Work and work Statistician.  Screening You may have the following tests or measurements:  Height, weight, and BMI.  Blood pressure.  Lipid and cholesterol levels. These may be checked every 5 years, or more frequently if you are over 68 years old.  Skin check.  Lung cancer screening. You may have this screening every year starting at age 7 if you have a 30-pack-year history of smoking and currently smoke or have quit within the past 15 years.  Fecal occult blood test (FOBT) of the stool. You may have this test every year starting at age 26.  Flexible sigmoidoscopy or colonoscopy. You may have a sigmoidoscopy every 5 years or a colonoscopy every 10 years starting at age 61.  Prostate cancer screening. Recommendations will vary depending on your family history and other risks.  Hepatitis C blood test.  Hepatitis B blood test.  Sexually transmitted disease (STD) testing.  Diabetes screening. This is done by  checking your blood sugar (glucose) after you have not eaten for a while (fasting). You may have this done every 1-3 years.  Abdominal aortic aneurysm (AAA) screening. You may need this if you are a current or former smoker.  Osteoporosis. You may be screened starting at age 63 if you are at high risk.  Talk with your health care provider about your test results, treatment options, and if necessary, the  need for more tests. Vaccines Your health care provider may recommend certain vaccines, such as:  Influenza vaccine. This is recommended every year.  Tetanus, diphtheria, and acellular pertussis (Tdap, Td) vaccine. You may need a Td booster every 10 years.  Varicella vaccine. You may need this if you have not been vaccinated.  Zoster vaccine. You may need this after age 22.  Measles, mumps, and rubella (MMR) vaccine. You may need at least one dose of MMR if you were born in 1957 or later. You may also need a second dose.  Pneumococcal 13-valent conjugate (PCV13) vaccine. One dose is recommended after age 21.  Pneumococcal polysaccharide (PPSV23) vaccine. One dose is recommended after age 22.  Meningococcal vaccine. You may need this if you have certain conditions.  Hepatitis A vaccine. You may need this if you have certain conditions or if you travel or work in places where you may be exposed to hepatitis A.  Hepatitis B vaccine. You may need this if you have certain conditions or if you travel or work in places where you may be exposed to hepatitis B.  Haemophilus influenzae type b (Hib) vaccine. You may need this if you have certain risk factors.  Talk to your health care provider about which screenings and vaccines you need and how often you need them. This information is not intended to replace advice given to you by your health care provider. Make sure you discuss any questions you have with your health care provider. Document Released: 03/24/2015 Document Revised: 11/15/2015 Document Reviewed: 12/27/2014 Elsevier Interactive Patient Education  2018 [place].   Nonspecific Chest Pain Chest pain can be caused by many different conditions. There is always a chance that your pain could be related to something serious, such as a heart attack or a blood clot in your lungs. Chest pain can also be caused by conditions that are not life-threatening. If you have chest pain, it is  very important to follow up with your health care provider. What are the causes? Causes of this condition include:  Heartburn.  Pneumonia or bronchitis.  Anxiety or stress.  Inflammation around your heart (pericarditis) or lung (pleuritis or pleurisy).  A blood clot in your lung.  A collapsed lung (pneumothorax). This can develop suddenly on its own (spontaneous pneumothorax) or from trauma to the chest.  Shingles infection (varicella-zoster virus).  Heart attack.  Damage to the bones, muscles, and cartilage that make up your chest wall. This can include: ? Bruised bones due to injury. ? Strained muscles or cartilage due to frequent or repeated coughing or overwork. ? Fracture to one or more ribs. ? Sore cartilage due to inflammation (costochondritis).  What increases the risk? Risk factors for this condition may include:  Activities that increase your risk for trauma or injury to your chest.  Respiratory infections or conditions that cause frequent coughing.  Medical conditions or overeating that can cause heartburn.  Heart disease or family history of heart disease.  Conditions or health behaviors that increase your risk of developing a blood clot.  Having  had chicken pox (varicella zoster).  What are the signs or symptoms? Chest pain can feel like:  Burning or tingling on the surface of your chest or deep in your chest.  Crushing, pressure, aching, or squeezing pain.  Dull or sharp pain that is worse when you move, cough, or take a deep breath.  Pain that is also felt in your back, neck, shoulder, or arm, or pain that spreads to any of these areas.  Your chest pain may come and go, or it may stay constant. How is this diagnosed? Lab tests or other studies may be needed to find the cause of your pain. Your health care provider may have you take a test called an ECG (electrocardiogram). An ECG records your heartbeat patterns at the time the test is performed.  You may also have other tests, such as:  Transthoracic echocardiogram (TTE). In this test, sound waves are used to create a picture of the heart structures and to look at how blood flows through your heart.  Transesophageal echocardiogram (TEE).This is a more advanced imaging test that takes images from inside your body. It allows your health care provider to see your heart in finer detail.  Cardiac monitoring. This allows your health care provider to monitor your heart rate and rhythm in real time.  Holter monitor. This is a portable device that records your heartbeat and can help to diagnose abnormal heartbeats. It allows your health care provider to track your heart activity for several days, if needed.  Stress tests. These can be done through exercise or by taking medicine that makes your heart beat more quickly.  Blood tests.  Other imaging tests.  How is this treated? Treatment depends on what is causing your chest pain. Treatment may include:  Medicines. These may include: ? Acid blockers for heartburn. ? Anti-inflammatory medicine. ? Pain medicine for inflammatory conditions. ? Antibiotic medicine, if an infection is present. ? Medicines to dissolve blood clots. ? Medicines to treat coronary artery disease (CAD).  Supportive care for conditions that do not require medicines. This may include: ? Resting. ? Applying heat or cold packs to injured areas. ? Limiting activities until pain decreases.  Follow these instructions at home: Medicines  If you were prescribed an antibiotic, take it as told by your health care provider. Do not stop taking the antibiotic even if you start to feel better.  Take over-the-counter and prescription medicines only as told by your health care provider. Lifestyle  Do not use any products that contain nicotine or tobacco, such as cigarettes and e-cigarettes. If you need help quitting, ask your health care provider.  Do not drink  alcohol.  Make lifestyle changes as directed by your health care provider. These may include: ? Getting regular exercise. Ask your health care provider to suggest some activities that are safe for you. ? Eating a heart-healthy diet. A registered dietitian can help you to learn healthy eating options. ? Maintaining a healthy weight. ? Managing diabetes, if necessary. ? Reducing stress, such as with yoga or relaxation techniques. General instructions  Avoid any activities that bring on chest pain.  If heartburn is the cause for your chest pain, raise (elevate) the head of your bed about 6 inches (15 cm) by putting blocks under the legs. Sleeping with more pillows does not effectively relieve heartburn because it only changes the position of your head.  Keep all follow-up visits as told by your health care provider. This is important. This includes any  further testing if your chest pain does not go away. Contact a health care provider if:  Your chest pain does not go away.  You have a rash with blisters on your chest.  You have a fever.  You have chills. Get help right away if:  Your chest pain is worse.  You have a cough that gets worse, or you cough up blood.  You have severe pain in your abdomen.  You have severe weakness.  You faint.  You have sudden, unexplained chest discomfort.  You have sudden, unexplained discomfort in your arms, back, neck, or jaw.  You have shortness of breath at any time.  You suddenly start to sweat, or your skin gets clammy.  You feel nauseous or you vomit.  You suddenly feel light-headed or dizzy.  Your heart begins to beat quickly, or it feels like it is skipping beats. These symptoms may represent a serious problem that is an emergency. Do not wait to see if the symptoms will go away. Get medical help right away. Call your local emergency services (911 in the [place] Do not drive yourself to the hospital. This information is not intended  to replace advice given to you by your health care provider. Make sure you discuss any questions you have with your health care provider. Document Released: 12/05/2004 Document Revised: 11/20/2015 Document Reviewed: 11/20/2015 Elsevier Interactive Patient Education  2017 [employer].   IF you received an x-ray today, you will receive an invoice from [facility]. Please contact [facility] at [phone] with questions or concerns regarding your invoice.   IF you received labwork today, you will receive an invoice from [facility]. Please contact [facility] at [phone] with questions or concerns regarding your invoice.   Our billing staff will not be able to assist you with questions regarding bills from these companies.  You will be contacted with the lab results as soon as they are available. The fastest way to get your results is to activate your My Chart account. Instructions are located on the last page of this paperwork. If you have not heard from [place] regarding the results in 2 weeks, please contact this office.

## 2017-03-03 NOTE — Progress Notes (Signed)
Subjective:  By signing my name below, I, Michael Mosley, attest that this documentation has been prepared under the direction and in the presence of Michael Agreste, MD Electronically Signed: Ladene Artist, ED Scribe 03/03/2017 at 8:56 AM.   Patient ID: Michael Mosley, male    DOB: 1951-02-21, 66 y.o.   MRN: [id number]  Chief Complaint  Patient presents with  . Medication Refill    levothyroxine  . Cough    congestion   . Annual Exam   HPI Michael Mosley is a 66 y.o. male who presents to Primary Care at [facility] for a welcome to medicare wellness exam, medication refill, cough. Pt will return for medicare wellness exam.  Chest Pain  Pt reports 1-2 non-radiating chest pains daily over the past 1-2 weeks. Pt describes cp has pressure with rolling on his sides. Reports associated cough with chest congestion but states cp resolves with coughing up thick, clear phlegm. Also reports some sudden, sharp cp while doing yard work that [employer] with resting and taking aspirin over the past 1-2 weeks. Denies cp at this time, light-headedness, dizziness, nausea, vomiting, diaphoresis, sob, fever. Pt has been using Flovent 2 puffs twice/day. Feels like Dulera worked better for him. He has seen Dr. Einar Mosley 5-6 yrs ago. Pt's father passed at age 49 from a massive MI.  Decreased Concentration  Has always had some trouble concentrating since second grade. He is able to balance his checkbook but states he is unable to concentrate on 1 thing if he's out at a party. Also reports easy agitation, confusion, difficulty sleeping as well but states these are not acute issues. No h/o tobacco use but reports marijuana use to calm him down. Denies SI/HI, depression.  Urinary Frequency  Pt reports urinary frequency over the past yr. Denies dysuria. Father has a h/o prostate CA.  Fatigue Pt reports fatigue, decreased appetite and activity change over the past month. Pt was 220 lbs in Sept, 239 lbs in April.  However, he does report eating more fruits, vegetables and fish over the past yr, more so over the past 6 months.   Wt Readings from Last 3 Encounters:  03/03/17 209 lb 3.2 oz (94.9 kg)  01/23/17 209 lb 12.8 oz (95.2 kg)  12/26/16 212 lb 12.8 oz (96.5 kg)   Allergic Rhinitis Flonase nasal spray daily. Reports environmental allergies with rhinorrhea, postnasal drip, congestion which has been ongoing for 11 yrs. No sick contacts.  Dental Problems Pt does not have a dentist or dental coverage. H/o CA at the base of his tongue ~ 2003.   Asthma Albuterol PRN and Flovent HFA for maintenance. Last asthma discussion on 11/15. Only intermittently using Dulera at that time. There was some confusion of Flovent use. Decided to remain on Flovent with option of changing back to [facility] if persistent cough. Seen by pulmonary in 2016.  Chronic Hip Pain Followed at [facility].  Hypothyroidism Lab Results  Component Value Date   TSH 2.880 03/05/2016  Takes Synthroid  75 mcg qd.   CA Screening Colonoscopy: last done 15 yrs ago at [facility]. No family hx Prostate CA Screening: Father had prostate CA Lab Results  Component Value Date   PSA 0.84 12/17/2010   Immunizations Immunization History  Administered Date(s) Administered  . Influenza,inj,Quad PF,6+ Mos 03/05/2016, 12/12/2016  . Influenza-Unspecified 12/22/2014  Pneumovax: today Shingles: pt does not recall Tetanus:  Fall Screening Has had 1 fall in the past yr. Denies unsteadiness, chronic dizziness but does report  occasional dizziness with bending to tie his shoe.  Depression Screening Depression screen [facility] 2/9 03/03/2017 01/23/2017 12/26/2016 12/23/2016 12/12/2016  Decreased Interest 0 1 1 0 0  Down, Depressed, Hopeless 0 1 1 0 0  PHQ - 2 Score 0 2 2 0 0  Altered sleeping - 3 3 - -  Tired, decreased energy - 2 1 - -  Change in appetite - 0 1 - -  Feeling bad or failure about yourself  - 0 0 - -  Trouble concentrating - 3 3 -  -  Moving slowly or fidgety/restless - 3 2 - -  Suicidal thoughts - 0 0 - -  PHQ-9 Score - 13 12 - -  Difficult doing work/chores - Extremely dIfficult Extremely dIfficult - -   Functional Status Survey: Is the patient deaf or have difficulty hearing?: No Does the patient have difficulty seeing, even when wearing glasses/contacts?: No Does the patient have difficulty concentrating, remembering, or making decisions?: Yes Does the patient have difficulty walking or climbing stairs?: No Does the patient have difficulty dressing or bathing?: No Does the patient have difficulty doing errands alone such as visiting a doctor's office or shopping?: No   Visual Acuity Screening   Right eye Left eye Both eyes  Without correction: 20/13-1 20/13-1 20/13-2  With correction:     Vision: last seen by Alois Cliche a few yrs ago Dentist: none Exercise: plays the drums 3-4 hours and golfs  Mental Status Screening 6CIT Screen 03/03/2017  What Year? 0 points  What month? 0 points  What time? 0 points  Count back from 20 0 points  Months in reverse 2 points  Repeat phrase 0 points  Total Score 2   Advanced Directives   Cardiovascular Screening No recent EKG.  Patient Active Problem List   Diagnosis Date Noted  . Sinusitis, chronic 08/18/2014  . Mild persistent chronic asthma without complication 24/58/0998  . 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 Malignant neoplasm of base of tongue ([place]) 10/09/2011  . Hypothyroidism 10/09/2011   Past Medical History:  Diagnosis Date  . Asthma   . Cancer ([place])    tongue cancer s/p chemo/RXT, followed by Dr Sondra Come.   Marland Kitchen Heart murmur   . History of radiation therapy 02/2005   CTV 6810 cGy, PTV 6000 cGy, ETV 227 cGy  . Thyroid disease   . Umbilical hernia    Past Surgical History:  Procedure Laterality Date  . SHOULDER SURGERY Right    Allergies  Allergen Reactions  . Vitamin C Other (See Comments)    Gets cold symptoms  . Hydrocodone   .  Albuterol Sulfate Palpitations   Prior to Admission medications   Medication Sig Start Date End Date Taking? Authorizing Provider  albuterol (PROVENTIL HFA;VENTOLIN HFA) 108 (90 Base) MCG/ACT inhaler Inhale 1-2 puffs into the lungs every 4 (four) hours as needed for wheezing or shortness of breath. 12/12/16   Michael Agreste, MD  aspirin 81 MG tablet Take 81 mg by mouth daily.    [provider]  fluticasone (FLONASE) 50 MCG/ACT nasal spray Place 2 sprays into both nostrils daily. 12/12/16   Michael Agreste, MD  fluticasone (FLOVENT HFA) 44 MCG/ACT inhaler Inhale 2 puffs into the lungs 2 (two) times daily. 12/12/16   Michael Agreste, MD  levothyroxine (SYNTHROID, LEVOTHROID) 75 MCG tablet Take 1 tablet (75 mcg total) by mouth daily. 03/05/16   Posey Boyer, MD   Social History   Socioeconomic History  .  Marital status: Divorced    Spouse name: Not on file  . Number of children: Not on file  . Years of education: Not on file  . Highest education level: Not on file  Social Needs  . Financial resource strain: Not on file  . Food insecurity - worry: Not on file  . Food insecurity - inability: Not on file  . Transportation needs - medical: Not on file  . Transportation needs - non-medical: Not on file  Occupational History  . Not on file  Tobacco Use  . Smoking status: Never Smoker  . Smokeless tobacco: Never Used  Substance and Sexual Activity  . Alcohol use: Yes    Comment: rarely  . Drug use: No  . Sexual activity: Not on file  Other Topics Concern  . Not on file  Social History Narrative  . Not on file   Review of Systems  Constitutional: Positive for activity change, appetite change, fatigue and unexpected weight change. Negative for diaphoresis and fever.  HENT: Positive for congestion, dental problem, postnasal drip, rhinorrhea and tinnitus.   Respiratory: Positive for cough, chest tightness, shortness of breath and wheezing.   Cardiovascular: Positive for  chest pain.  Gastrointestinal: Negative for nausea and vomiting.  Genitourinary: Positive for frequency. Negative for dysuria.  Musculoskeletal: Positive for arthralgias and joint swelling. Negative for gait problem.  Allergic/Immunologic: Positive for environmental allergies and food allergies.  Neurological: Negative for dizziness and light-headedness.  Psychiatric/Behavioral: Positive for agitation, confusion, decreased concentration and sleep disturbance. Negative for dysphoric mood, self-injury and suicidal ideas.      Objective:   Physical Exam  Constitutional: He is oriented to person, place, and time. He appears well-developed and well-nourished.  HENT:  Head: Normocephalic and atraumatic.  Right Ear: External ear normal.  Left Ear: External ear normal.  Mouth/Throat: Oropharynx is clear and moist.  Eyes: Conjunctivae and EOM are normal. Pupils are equal, round, and reactive to light.  Neck: Normal range of motion. Neck supple. No thyromegaly present.  Cardiovascular: Normal rate, regular rhythm, normal heart sounds and intact distal pulses.  Pulmonary/Chest: Effort normal. No respiratory distress. He has wheezes (expiratory). He has no rhonchi.  Speaking in full sentences  Abdominal: Soft. He exhibits no distension. There is no tenderness. Hernia confirmed negative in the right inguinal area and confirmed negative in the left inguinal area.  Genitourinary: Prostate normal.  Musculoskeletal: Normal range of motion. He exhibits no edema or tenderness.  Lymphadenopathy:    He has no cervical adenopathy.  Neurological: He is alert and oriented to person, place, and time. He has normal reflexes.  Skin: Skin is warm and dry.  Psychiatric: He has a normal mood and affect. His behavior is normal.  Vitals reviewed.  Vitals:   03/03/17 0819  BP: 110/76  Pulse: 61  Resp: 18  Temp: 98.2 F (36.8 C)  TempSrc: Oral  SpO2: 95%  Weight: 209 lb 3.2 oz (94.9 kg)  Height: 5' 11.42"  (1.814 m)   EKG reading done by Michael Agreste, MD: sinus bradycardia rate 53. Otherwise no acute findings.  Results for orders placed or performed in visit on 03/03/17  POCT glucose (manual entry)  Result Value Ref Range   POC Glucose 99 70 - 99 mg/dl  POCT urinalysis dipstick  Result Value Ref Range   Color, UA yellow yellow   Clarity, UA clear clear   Glucose, UA negative negative mg/dL   Bilirubin, UA negative negative   Ketones, POC UA negative  negative mg/dL   Spec Grav, UA 1.020 1.010 - 1.025   Blood, UA small (A) negative   pH, UA 5.0 5.0 - 8.0   Protein Ur, POC negative negative mg/dL   Urobilinogen, UA 0.2 0.2 or 1.0 E.U./dL   Nitrite, UA Negative Negative   Leukocytes, UA Negative Negative  POCT Microscopic Urinalysis ([facility])  Result Value Ref Range   WBC,UR,HPF,POC None None WBC/hpf   RBC,UR,HPF,POC None None RBC/hpf   Bacteria Few (A) None, Too numerous to count   Mucus Absent Absent   Epithelial Cells, UR Per Microscopy None None, Too numerous to count cells/hpf    Dg Chest 2 View  Result Date: 03/03/2017 CLINICAL DATA:  66 year old male with chest pain and cough for 2 weeks. EXAM: CHEST  2 VIEW COMPARISON:  01/27/2016 FINDINGS: Mildly larger lung volumes. Mediastinal contours remain normal. Visualized tracheal air column is within normal limits. No pneumothorax, pulmonary edema, pleural effusion or confluent pulmonary opacity. Stable borderline to mild increased interstitial markings in both lungs. No acute pulmonary opacity. No acute osseous abnormality identified. Negative visible bowel gas pattern. IMPRESSION: Negative for age.  No acute cardiopulmonary abnormality. Electronically Signed   By: Genevie Ann M.D.   On: 03/03/2017 09:19      Assessment & Plan:    IDREES QUAM is a 66 y.o. male Medicare annual wellness visit, initial   - anticipatory guidance as below in AVS, screening labs if needed. Health maintenance items as above in HPI  discussed/recommended as applicable.   - no concerning responses on depression, fall, or functional status screening. Any positive responses noted as above. Advanced directives discussed as in CHL.   Chest pain, unspecified type - Plan: DG Chest 2 View, EKG 12-Lead, CBC, Ambulatory referral to Cardiology  - Episodic, does report some symptoms with exertion, but also positional and with respiratory symptoms/asthma. Concerning findings on EKG in office. Denies current chest pain.  -Change back to [facility] from Flovent  -Avoid exertional activity for now and refer to cardiology. ER/911 precautions for return of chest pain  Cough   Moderate persistent asthma with acute exacerbation - Plan: mometasone-formoterol (DULERA) 100-5 MCG/ACT AERO  - Possible flare of asthma, try restarting Dulera, albuterol if needed, and follow up in next 2-3 days if not improving for possible prednisone.  Hypothyroidism, unspecified type - Plan: TSH, levothyroxine (SYNTHROID, LEVOTHROID) 75 MCG tablet  -Check TSH, continue same dose of Synthroid for now  Frequent urination - Plan: POCT glucose (manual entry), POCT urinalysis dipstick, POCT Microscopic Urinalysis ([facility]), PSA  -Check PSA, reassuring urinalysis in office. Depending on PSA results, follow-up to discuss other causes  Screening for hyperlipidemia - Plan: Lipid panel, Comprehensive metabolic panel  Fatigue, unspecified type  -May be multifactorial, treat asthma as above, other lab work pending. Follow-up in the next 1-2 weeks to discuss further  Allergic rhinitis, unspecified seasonality, unspecified trigger  -Symptomatic care with Flonase as needed.  Loss of weight  -Chest x-ray without apparent nodularity as above, will start with some screening blood work, stressed importance of GI follow-up for colonoscopy, follow-up to discuss further in the 1-2 weeks.  Need for shingles vaccine - Plan: Zoster Vaccine Adjuvanted [facility]) injection - sent to  pharmacy  Need for pneumococcal vaccine - Plan: Pneumococcal conjugate vaccine 13-valent IM given  Screen for colon cancer - Plan: Ambulatory referral to Gastroenterology  There are other unrelated non-urgent complaints, and time does not permit me to address these routine issues at today's visit. I've requested  another appointment to review these additional issues.  Meds ordered this encounter  Medications  . Zoster Vaccine Adjuvanted [facility]) injection    Sig: Inject 0.5 mLs into the muscle once for 1 dose. Repeat in 2-6 months.    Dispense:  0.5 mL    Refill:  1  . levothyroxine (SYNTHROID, LEVOTHROID) 75 MCG tablet    Sig: Take 1 tablet (75 mcg total) by mouth daily.    Dispense:  90 tablet    Refill:  3  . mometasone-formoterol (DULERA) 100-5 MCG/ACT AERO    Sig: Inhale 2 puffs into the lungs daily.    Dispense:  13 g    Refill:  3   Patient Instructions   Change back to [facility] for asthma. Albuterol if needed for wheezing or shortness of breath. If not improving with change back to [facility] in next 2-3 days please return for recheck to see if prednisone or antibiotic may be needed.   I will refer you to cardiology, but if any new or worsening chest pains,  call 911 or go to emergency room. Discuss the dizziness with standing with cardiology as well as further at our next visit. Make sure you are drinking sufficient water throughout the day. Return to the clinic or go to the nearest emergency room if any of your symptoms worsen or new symptoms occur.  I will check prostate test and urine test, but please follow up to discuss those symptoms further.   Follow up with orthopaedist for hip pain. We can discuss that further if needed at next visit.   Continue flonase, but we can can discuss runny nose further at next visit.   I recommend ophthalmologist or optometrist appointment for other eye screening.   Please follow up to discuss irritation, concentration, and sleep issues  further.   Please follow up to discuss weight loss further once we have your labs back.   Return to the clinic or go to the nearest emergency room if any of your symptoms worsen or new symptoms occur.   Preventive Care 37 Years and Older, Male Preventive care refers to lifestyle choices and visits with your health care provider that can promote health and wellness. What does preventive care include?  A yearly physical exam. This is also called an annual well check.  Dental exams once or twice a year.  Routine eye exams. Ask your health care provider how often you should have your eyes checked.  Personal lifestyle choices, including: ? Daily care of your teeth and gums. ? Regular physical activity. ? Eating a healthy diet. ? Avoiding tobacco and drug use. ? Limiting alcohol use. ? Practicing safe sex. ? Taking low doses of aspirin every day. ? Taking vitamin and mineral supplements as recommended by your health care provider. What happens during an annual well check? The services and screenings done by your health care provider during your annual well check will depend on your age, overall health, lifestyle risk factors, and family history of disease. Counseling Your health care provider may ask you questions about your:  Alcohol use.  Tobacco use.  Drug use.  Emotional well-being.  Home and relationship well-being.  Sexual activity.  Eating habits.  History of falls.  Memory and ability to understand (cognition).  Work and work Statistician.  Screening You may have the following tests or measurements:  Height, weight, and BMI.  Blood pressure.  Lipid and cholesterol levels. These may be checked every 5 years, or more frequently if you  are over 41 years old.  Skin check.  Lung cancer screening. You may have this screening every year starting at age 52 if you have a 30-pack-year history of smoking and currently smoke or have quit within the past 15  years.  Fecal occult blood test (FOBT) of the stool. You may have this test every year starting at age 6.  Flexible sigmoidoscopy or colonoscopy. You may have a sigmoidoscopy every 5 years or a colonoscopy every 10 years starting at age 66.  Prostate cancer screening. Recommendations will vary depending on your family history and other risks.  Hepatitis C blood test.  Hepatitis B blood test.  Sexually transmitted disease (STD) testing.  Diabetes screening. This is done by checking your blood sugar (glucose) after you have not eaten for a while (fasting). You may have this done every 1-3 years.  Abdominal aortic aneurysm (AAA) screening. You may need this if you are a current or former smoker.  Osteoporosis. You may be screened starting at age 33 if you are at high risk.  Talk with your health care provider about your test results, treatment options, and if necessary, the need for more tests. Vaccines Your health care provider may recommend certain vaccines, such as:  Influenza vaccine. This is recommended every year.  Tetanus, diphtheria, and acellular pertussis (Tdap, Td) vaccine. You may need a Td booster every 10 years.  Varicella vaccine. You may need this if you have not been vaccinated.  Zoster vaccine. You may need this after age 66.  Measles, mumps, and rubella (MMR) vaccine. You may need at least one dose of MMR if you were born in 1957 or later. You may also need a second dose.  Pneumococcal 13-valent conjugate (PCV13) vaccine. One dose is recommended after age 80.  Pneumococcal polysaccharide (PPSV23) vaccine. One dose is recommended after age 66.  Meningococcal vaccine. You may need this if you have certain conditions.  Hepatitis A vaccine. You may need this if you have certain conditions or if you travel or work in places where you may be exposed to hepatitis A.  Hepatitis B vaccine. You may need this if you have certain conditions or if you travel or work in  places where you may be exposed to hepatitis B.  Haemophilus influenzae type b (Hib) vaccine. You may need this if you have certain risk factors.  Talk to your health care provider about which screenings and vaccines you need and how often you need them. This information is not intended to replace advice given to you by your health care provider. Make sure you discuss any questions you have with your health care provider. Document Released: 03/24/2015 Document Revised: 11/15/2015 Document Reviewed: 12/27/2014 Elsevier Interactive Patient Education  2018 [employer].   Nonspecific Chest Pain Chest pain can be caused by many different conditions. There is always a chance that your pain could be related to something serious, such as a heart attack or a blood clot in your lungs. Chest pain can also be caused by conditions that are not life-threatening. If you have chest pain, it is very important to follow up with your health care provider. What are the causes? Causes of this condition include:  Heartburn.  Pneumonia or bronchitis.  Anxiety or stress.  Inflammation around your heart (pericarditis) or lung (pleuritis or pleurisy).  A blood clot in your lung.  A collapsed lung (pneumothorax). This can develop suddenly on its own (spontaneous pneumothorax) or from trauma to the chest.  Shingles infection (  varicella-zoster virus).  Heart attack.  Damage to the bones, muscles, and cartilage that make up your chest wall. This can include: ? Bruised bones due to injury. ? Strained muscles or cartilage due to frequent or repeated coughing or overwork. ? Fracture to one or more ribs. ? Sore cartilage due to inflammation (costochondritis).  What increases the risk? Risk factors for this condition may include:  Activities that increase your risk for trauma or injury to your chest.  Respiratory infections or conditions that cause frequent coughing.  Medical conditions or overeating that  can cause heartburn.  Heart disease or family history of heart disease.  Conditions or health behaviors that increase your risk of developing a blood clot.  Having had chicken pox (varicella zoster).  What are the signs or symptoms? Chest pain can feel like:  Burning or tingling on the surface of your chest or deep in your chest.  Crushing, pressure, aching, or squeezing pain.  Dull or sharp pain that is worse when you move, cough, or take a deep breath.  Pain that is also felt in your back, neck, shoulder, or arm, or pain that spreads to any of these areas.  Your chest pain may come and go, or it may stay constant. How is this diagnosed? Lab tests or other studies may be needed to find the cause of your pain. Your health care provider may have you take a test called an ECG (electrocardiogram). An ECG records your heartbeat patterns at the time the test is performed. You may also have other tests, such as:  Transthoracic echocardiogram (TTE). In this test, sound waves are used to create a picture of the heart structures and to look at how blood flows through your heart.  Transesophageal echocardiogram (TEE).This is a more advanced imaging test that takes images from inside your body. It allows your health care provider to see your heart in finer detail.  Cardiac monitoring. This allows your health care provider to monitor your heart rate and rhythm in real time.  Holter monitor. This is a portable device that records your heartbeat and can help to diagnose abnormal heartbeats. It allows your health care provider to track your heart activity for several days, if needed.  Stress tests. These can be done through exercise or by taking medicine that makes your heart beat more quickly.  Blood tests.  Other imaging tests.  How is this treated? Treatment depends on what is causing your chest pain. Treatment may include:  Medicines. These may include: ? Acid blockers for  heartburn. ? Anti-inflammatory medicine. ? Pain medicine for inflammatory conditions. ? Antibiotic medicine, if an infection is present. ? Medicines to dissolve blood clots. ? Medicines to treat coronary artery disease (CAD).  Supportive care for conditions that do not require medicines. This may include: ? Resting. ? Applying heat or cold packs to injured areas. ? Limiting activities until pain decreases.  Follow these instructions at home: Medicines  If you were prescribed an antibiotic, take it as told by your health care provider. Do not stop taking the antibiotic even if you start to feel better.  Take over-the-counter and prescription medicines only as told by your health care provider. Lifestyle  Do not use any products that contain nicotine or tobacco, such as cigarettes and e-cigarettes. If you need help quitting, ask your health care provider.  Do not drink alcohol.  Make lifestyle changes as directed by your health care provider. These may include: ? Getting regular exercise. Ask your  health care provider to suggest some activities that are safe for you. ? Eating a heart-healthy diet. A registered dietitian can help you to learn healthy eating options. ? Maintaining a healthy weight. ? Managing diabetes, if necessary. ? Reducing stress, such as with yoga or relaxation techniques. General instructions  Avoid any activities that bring on chest pain.  If heartburn is the cause for your chest pain, raise (elevate) the head of your bed about 6 inches (15 cm) by putting blocks under the legs. Sleeping with more pillows does not effectively relieve heartburn because it only changes the position of your head.  Keep all follow-up visits as told by your health care provider. This is important. This includes any further testing if your chest pain does not go away. Contact a health care provider if:  Your chest pain does not go away.  You have a rash with blisters on your  chest.  You have a fever.  You have chills. Get help right away if:  Your chest pain is worse.  You have a cough that gets worse, or you cough up blood.  You have severe pain in your abdomen.  You have severe weakness.  You faint.  You have sudden, unexplained chest discomfort.  You have sudden, unexplained discomfort in your arms, back, neck, or jaw.  You have shortness of breath at any time.  You suddenly start to sweat, or your skin gets clammy.  You feel nauseous or you vomit.  You suddenly feel light-headed or dizzy.  Your heart begins to beat quickly, or it feels like it is skipping beats. These symptoms may represent a serious problem that is an emergency. Do not wait to see if the symptoms will go away. Get medical help right away. Call your local emergency services (911 in the [place] Do not drive yourself to the hospital. This information is not intended to replace advice given to you by your health care provider. Make sure you discuss any questions you have with your health care provider. Document Released: 12/05/2004 Document Revised: 11/20/2015 Document Reviewed: 11/20/2015 Elsevier Interactive Patient Education  2017 [employer].   IF you received an x-ray today, you will receive an invoice from [facility]. Please contact [facility] at [phone] with questions or concerns regarding your invoice.   IF you received labwork today, you will receive an invoice from [facility]. Please contact [facility] at [phone] with questions or concerns regarding your invoice.   Our billing staff will not be able to assist you with questions regarding bills from these companies.  You will be contacted with the lab results as soon as they are available. The fastest way to get your results is to activate your My Chart account. Instructions are located on the last page of this paperwork. If you have not heard from [place] regarding the results in 2 weeks, please  contact this office.      I personally performed the services described in this documentation, which was scribed in my presence. The recorded information has been reviewed and considered for accuracy and completeness, addended by me as needed, and agree with information above.  Signed,   [name], MD [facility] [place].  03/03/17 2:42 PM

## 2017-03-04 LAB — COMPREHENSIVE METABOLIC PANEL
ALK PHOS: 99 IU/L (ref 39–117)
ALT: 12 IU/L (ref 0–44)
AST: 18 IU/L (ref 0–40)
Albumin/Globulin Ratio: 1.5 (ref 1.2–2.2)
Albumin: 4.7 g/dL (ref 3.6–4.8)
BUN/Creatinine Ratio: 12 (ref 10–24)
BUN: 14 mg/dL (ref 8–27)
Bilirubin Total: 0.3 mg/dL (ref 0.0–1.2)
CALCIUM: 9.9 mg/dL (ref 8.6–10.2)
CO2: 22 mmol/L (ref 20–29)
CREATININE: 1.21 mg/dL (ref 0.76–1.27)
Chloride: 101 mmol/L (ref 96–106)
GFR calc Af Amer: 72 mL/min/{1.73_m2} (ref >59)
GFR, EST NON AFRICAN AMERICAN: 62 mL/min/{1.73_m2} (ref >59)
GLOBULIN, TOTAL: 3.2 g/dL (ref 1.5–4.5)
GLUCOSE: 93 mg/dL (ref 65–99)
Potassium: 5.1 mmol/L (ref 3.5–5.2)
SODIUM: 140 mmol/L (ref 134–144)
Total Protein: 7.9 g/dL (ref 6.0–8.5)

## 2017-03-04 LAB — PSA: Prostate Specific Ag, Serum: 1.2 ng/mL (ref 0.0–4.0)

## 2017-03-04 LAB — TSH: TSH: 1.97 u[IU]/mL (ref 0.450–4.500)

## 2017-03-04 LAB — CBC
HEMATOCRIT: 42.7 % (ref 37.5–51.0)
HEMOGLOBIN: 14.3 g/dL (ref 13.0–17.7)
MCH: 30.4 pg (ref 26.6–33.0)
MCHC: 33.5 g/dL (ref 31.5–35.7)
MCV: 91 fL (ref 79–97)
Platelets: 212 10*3/uL (ref 150–379)
RBC: 4.7 x10E6/uL (ref 4.14–5.80)
RDW: 14.2 % (ref 12.3–15.4)
WBC: 7.7 10*3/uL (ref 3.4–10.8)

## 2017-03-04 LAB — LIPID PANEL
CHOL/HDL RATIO: 5.1 ratio — AB (ref 0.0–5.0)
CHOLESTEROL TOTAL: 215 mg/dL — AB (ref 100–199)
HDL: 42 mg/dL (ref >39)
LDL CALC: 146 mg/dL — AB (ref 0–99)
Triglycerides: 135 mg/dL (ref 0–149)
VLDL CHOLESTEROL CAL: 27 mg/dL (ref 5–40)

## 2017-03-05 ENCOUNTER — Telehealth: Payer: Self-pay

## 2017-03-05 NOTE — Telephone Encounter (Signed)
Started PA on [facility] for patient today via Cover My Meds.  Key code is [id number].  Should have results within 5 business days.

## 2017-03-06 NOTE — Telephone Encounter (Signed)
Patient's PA was approved from 03/05/2017-03/10/2018.  Patient notified.

## 2017-03-13 DIAGNOSIS — R0602 Shortness of breath: Secondary | ICD-10-CM | POA: Diagnosis not present

## 2017-03-13 DIAGNOSIS — I350 Nonrheumatic aortic (valve) stenosis: Secondary | ICD-10-CM | POA: Diagnosis not present

## 2017-03-13 DIAGNOSIS — R0789 Other chest pain: Secondary | ICD-10-CM | POA: Diagnosis not present

## 2017-03-13 DIAGNOSIS — R0989 Other specified symptoms and signs involving the circulatory and respiratory systems: Secondary | ICD-10-CM | POA: Diagnosis not present

## 2017-03-21 DIAGNOSIS — R0602 Shortness of breath: Secondary | ICD-10-CM | POA: Diagnosis not present

## 2017-03-21 DIAGNOSIS — R931 Abnormal findings on diagnostic imaging of heart and coronary circulation: Secondary | ICD-10-CM | POA: Diagnosis not present

## 2017-03-21 DIAGNOSIS — R0789 Other chest pain: Secondary | ICD-10-CM | POA: Diagnosis not present

## 2017-03-25 DIAGNOSIS — R0989 Other specified symptoms and signs involving the circulatory and respiratory systems: Secondary | ICD-10-CM | POA: Diagnosis not present

## 2017-03-25 DIAGNOSIS — I350 Nonrheumatic aortic (valve) stenosis: Secondary | ICD-10-CM | POA: Diagnosis not present

## 2017-03-25 DIAGNOSIS — Z139 Encounter for screening, unspecified: Secondary | ICD-10-CM | POA: Diagnosis not present

## 2017-04-02 DIAGNOSIS — I25118 Atherosclerotic heart disease of native coronary artery with other forms of angina pectoris: Secondary | ICD-10-CM | POA: Diagnosis not present

## 2017-04-02 DIAGNOSIS — R0602 Shortness of breath: Secondary | ICD-10-CM | POA: Diagnosis not present

## 2017-04-02 DIAGNOSIS — R0989 Other specified symptoms and signs involving the circulatory and respiratory systems: Secondary | ICD-10-CM | POA: Diagnosis not present

## 2017-04-02 DIAGNOSIS — I350 Nonrheumatic aortic (valve) stenosis: Secondary | ICD-10-CM | POA: Diagnosis not present

## 2017-04-03 DIAGNOSIS — I25118 Atherosclerotic heart disease of native coronary artery with other forms of angina pectoris: Secondary | ICD-10-CM | POA: Diagnosis not present

## 2017-04-03 DIAGNOSIS — E7800 Pure hypercholesterolemia, unspecified: Secondary | ICD-10-CM | POA: Diagnosis not present

## 2017-04-04 ENCOUNTER — Encounter: Payer: Self-pay | Admitting: Internal Medicine

## 2017-04-16 ENCOUNTER — Encounter: Payer: Self-pay | Admitting: Internal Medicine

## 2017-04-16 ENCOUNTER — Ambulatory Visit (INDEPENDENT_AMBULATORY_CARE_PROVIDER_SITE_OTHER): Payer: PPO | Admitting: Internal Medicine

## 2017-04-16 VITALS — BP 144/70 | HR 82 | Ht 73.0 in | Wt 209.8 lb

## 2017-04-16 DIAGNOSIS — R9389 Abnormal findings on diagnostic imaging of other specified body structures: Secondary | ICD-10-CM

## 2017-04-16 DIAGNOSIS — R1310 Dysphagia, unspecified: Secondary | ICD-10-CM | POA: Diagnosis not present

## 2017-04-16 DIAGNOSIS — Z1211 Encounter for screening for malignant neoplasm of colon: Secondary | ICD-10-CM

## 2017-04-16 NOTE — Patient Instructions (Signed)
You have been scheduled for a CT scan of the abdomen and pelvis at [facility] ([address]---this is in the same building as Press [facility]).   You are scheduled on Monday, 04/21/17 at 8:30 am. You should arrive 15 minutes prior to your appointment time for registration. Please follow the written instructions below on the day of your exam:  WARNING: IF YOU ARE ALLERGIC TO IODINE/X-RAY DYE, PLEASE NOTIFY RADIOLOGY IMMEDIATELY AT [phone]! YOU WILL BE GIVEN A 13 HOUR PREMEDICATION PREP.  1) Do not eat or drink anything after 4:30 am (4 hours prior to your test) 2) You have been given 2 bottles of oral contrast to drink. The solution may taste               better if refrigerated, but do NOT add ice or any other liquid to this solution. Shake             well before drinking.    Drink 1 bottle of contrast @ 6:30 am (2 hours prior to your exam)  Drink 1 bottle of contrast @ 7:30 am (1 hour prior to your exam)  You may take any medications as prescribed with a small amount of water except for the following: Metformin, Glucophage, Glucovance, Avandamet, Riomet, Fortamet, Actoplus Met, Janumet, Glumetza or Metaglip. The above medications must be held the day of the exam AND 48 hours after the exam.  The purpose of you drinking the oral contrast is to aid in the visualization of your intestinal tract. The contrast solution may cause some diarrhea. Before your exam is started, you will be given a small amount of fluid to drink. Depending on your individual set of symptoms, you may also receive an intravenous injection of x-ray contrast/dye. Plan on being at [facility] for 30 minutes or longer, depending on the type of exam you are having performed.  This test typically takes 30-45 minutes to complete.  If you have any questions regarding your exam or if you need to reschedule, you may call the CT department at [phone] between the hours of 8:00 am and 5:00 pm,  Monday-Friday.  ____________________________________________________________________  Dennis Bast have been scheduled for an endoscopy and colonoscopy. Please follow the written instructions given to you at your visit today. Please pick up your prep supplies at the pharmacy within the next 1-3 days. If you use inhalers (even only as needed), please bring them with you on the day of your procedure. Your physician has requested that you go to www.startemmi.com and enter the access code given to you at your visit today. This web site gives a general overview about your procedure. However, you should still follow specific instructions given to you by our office regarding your preparation for the procedure. _______________________________________________________________  If you are age 70 or older, your body mass index should be between 23-30. Your Body mass index is 27.68 kg/m. If this is out of the aforementioned range listed, please consider follow up with your Primary Care Provider.  If you are age 82 or younger, your body mass index should be between 19-25. Your Body mass index is 27.68 kg/m. If this is out of the aformentioned range listed, please consider follow up with your Primary Care Provider.

## 2017-04-16 NOTE — Addendum Note (Signed)
Addended by: Larina Bras on: 04/16/2017 03:06 PM   Modules accepted: Orders

## 2017-04-16 NOTE — Progress Notes (Signed)
Patient ID: Michael Mosley, male   DOB: 06-26-1950, 67 y.o.   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 Carlota Raspberry to discuss dysphagia and abnormal imaging/lymphadenopathy seen in the right upper quadrant.  He is here today with his sister.  He reports that he had a CT scan of his chest which showed some abnormal lymph nodes and he was told to follow-up with GI.  He also states he is having dysphagia and needs a colonoscopy for screening.  He had a CT cardiac calcium scoring exam performed with [facility] on 03/13/2017 --I have reviewed this study and it showed extensive calcified coronary artery plaque.  But also prominent periportal and gastrohepatic lymph nodes.  These were felt nonspecific and were borderline enlarged.  He states that he has no specific GI complaint other than long-standing solid food dysphagia.  This is worse with foods like meats.  It also is worse if he eats quickly or if he is in a rush.  He will have to stop eating and walk around and eventually food will go down.  No history of food impaction requiring intervention.  He does have a history of heartburn but less recently.  He will drink vinegar 1-2 days/week because he has heard that this is "good for you".  He used to use this for heartburn but recently has not had heartburn issues.  No odynophagia.  No nausea.  He did have an incarcerated umbilical hernia operation 2-3 months ago also in [facility].  Since this time he notes an approximate 25 pound weight loss.  He reports bowel movements have been regular with no blood or melena.  No diarrhea or constipation.  He had a colonoscopy performed by Dr. Velora Heckler around age 48.  Indeed this was performed on 03/31/2001.  He had diminutive polyps removed  from the sigmoid and rectum.  He also had external hemorrhoids at that time.  These polyps were removed but I do not have the pathology report  He denies a family history of GI tract malignancy.  He is divorced with 2 adult children.  He has never used tobacco.  No alcohol.  Past Medical History:  Diagnosis Date  . Asthma   . Cancer ([place])    tongue cancer s/p chemo/RXT, followed by Dr Sondra Come.   Marland Kitchen Heart murmur   . History of radiation therapy 02/2005   CTV 6810 cGy, PTV 6000 cGy, ETV 227 cGy  . Hyperlipidemia   . Hypertension   . Thyroid disease   . Umbilical hernia     Past Surgical History:  Procedure Laterality Date  . SHOULDER SURGERY Right     Outpatient Medications Prior to Visit  Medication Sig Dispense Refill  . albuterol (PROVENTIL HFA;VENTOLIN HFA) 108 (90 Base) MCG/ACT inhaler Inhale 1-2 puffs into the lungs every 4 (four) hours as needed for wheezing or shortness of breath. 1 Inhaler 0  . aspirin 81 MG tablet Take 81 mg by mouth daily.    . fluticasone (FLONASE) 50 MCG/ACT nasal spray Place 2 sprays into both nostrils daily. 15 g 11  . levothyroxine (SYNTHROID, LEVOTHROID) 75 MCG tablet Take 1 tablet (75 mcg total) by mouth daily. 90 tablet 3  . mometasone-formoterol (DULERA) 100-5 MCG/ACT AERO Inhale 2 puffs into the lungs daily. 13 g 3  .  ramipril (ALTACE) 2.5 MG capsule Take 2.5 mg by mouth daily.    . rosuvastatin (CRESTOR) 20 MG tablet Take 20 mg by mouth daily.     No facility-administered medications prior to visit.     Allergies  Allergen Reactions  . Vitamin C Other (See Comments)    Gets cold symptoms  . Hydrocodone   . Albuterol Sulfate Palpitations    History reviewed. No pertinent family history.  Social History   Tobacco Use  . Smoking status: Never Smoker  . Smokeless tobacco: Never Used  Substance Use Topics  . Alcohol use: Yes    Comment: rarely  . Drug use: No    ROS: As per history of present illness, otherwise negative  BP  (!) 144/70   Pulse 82   Ht 6\' 1"  (1.854 m)   Wt 209 lb 12.8 oz (95.2 kg)   BMI 27.68 kg/m  Constitutional: Well-developed and well-nourished. No distress. HEENT: Normocephalic and atraumatic.  Conjunctivae are normal.  No scleral icterus. Neck: Neck supple. Trachea midline.  Some scant tautness anterior neck bilateral Cardiovascular: Normal rate, regular rhythm and intact distal pulses. No M/R/G Pulmonary/chest: Effort normal and breath sounds normal. No wheezing, rales or rhonchi. Abdominal: Soft, nontender, nondistended. Bowel sounds active throughout. There are no masses palpable. No hepatosplenomegaly. Well healed incision/scar superior to umbilicus Extremities: no clubbing, cyanosis, or edema Neurological: Alert and oriented to person place and time. Skin: Skin is warm and dry.  Psychiatric: Normal mood and affect. Behavior is normal.  RELEVANT LABS AND IMAGING: CBC    Component Value Date/Time   WBC 7.7 03/03/2017 1000   WBC 10.0 11/26/2016 1450   RBC 4.70 03/03/2017 1000   RBC 4.60 11/26/2016 1450   HGB 14.3 03/03/2017 1000   HGB 14.6 12/17/2010 1056   HCT 42.7 03/03/2017 1000   HCT 42.1 12/17/2010 1056   PLT 212 03/03/2017 1000   MCV 91 03/03/2017 1000   MCV 93.3 12/17/2010 1056   MCH 30.4 03/03/2017 1000   MCH 31.1 11/26/2016 1450   MCHC 33.5 03/03/2017 1000   MCHC 33.6 11/26/2016 1450   RDW 14.2 03/03/2017 1000   RDW 13.4 12/17/2010 1056   LYMPHSABS WILL FOLLOW 03/05/2016 1518   LYMPHSABS 1.3 12/17/2010 1056   MONOABS 0.6 08/16/2014 1050   MONOABS 0.6 12/17/2010 1056   EOSABS WILL FOLLOW 03/05/2016 1518   BASOSABS WILL FOLLOW 03/05/2016 1518   BASOSABS 0.0 12/17/2010 1056    CMP     Component Value Date/Time   NA 140 03/03/2017 1000   K 5.1 03/03/2017 1000   CL 101 03/03/2017 1000   CO2 22 03/03/2017 1000   GLUCOSE 93 03/03/2017 1000   GLUCOSE 101 (H) 11/26/2016 1450   BUN 14 03/03/2017 1000   CREATININE 1.21 03/03/2017 1000   CALCIUM 9.9 03/03/2017  1000   PROT 7.9 03/03/2017 1000   ALBUMIN 4.7 03/03/2017 1000   AST 18 03/03/2017 1000   ALT 12 03/03/2017 1000   ALKPHOS 99 03/03/2017 1000   BILITOT 0.3 03/03/2017 1000   GFRNONAA 62 03/03/2017 1000   GFRAA 72 03/03/2017 1000    ASSESSMENT/PLAN: 67 year old male with a past medical history of 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 Carlota Raspberry to discuss dysphagia and abnormal imaging/lymphadenopathy seen in the right upper quadrant.  1.  Gastrohepatic and periportal lymphadenopathy --borderline enlarged lymph nodes call incidentally on  a CT scan of the chest performed for cardiac calcium scoring.  He did have an umbilical  hernia repair 2-3 months ago and perhaps these lymph nodes were reactive after surgery.  I have recommended that we follow this up with dedicated abdominal imaging with CT scan of the abdomen and pelvis with contrast.  This test will be ordered today  2.  Solid food dysphagia --I recommended upper endoscopy to evaluate his dysphagia and perhaps for dilation if necessary.  We discussed the risk, benefits and alternatives and he is agreeable to proceed  3.  CRC screening --colonoscopy 16 years ago with diminutive distal polyps removed.  Path unknown.  He is certainly due colon cancer screening at this time and colonoscopy is recommended.  We discussed the risk, benefits and alternatives and he is agreeable to proceeding with colonoscopy.    SW:FUXNAT, Ranell Patrick, [address] [place], [place] [address]

## 2017-04-17 ENCOUNTER — Ambulatory Visit
Admission: RE | Admit: 2017-04-17 | Discharge: 2017-04-17 | Disposition: A | Discharge status: Home / Self Care | Payer: Self-pay | Source: Ambulatory Visit | Attending: Internal Medicine | Admitting: Internal Medicine

## 2017-04-17 ENCOUNTER — Telehealth: Payer: Self-pay | Admitting: Internal Medicine

## 2017-04-17 ENCOUNTER — Other Ambulatory Visit: Payer: Self-pay | Admitting: Internal Medicine

## 2017-04-17 DIAGNOSIS — R9389 Abnormal findings on diagnostic imaging of other specified body structures: Secondary | ICD-10-CM

## 2017-04-17 MED ORDER — SUPREP BOWEL PREP KIT 17.5-3.13-1.6 GM/177ML PO SOLN: 1.0000 | ORAL | 0 refills | Status: DC

## 2017-04-17 NOTE — Addendum Note (Signed)
Addended by: Larina Bras on: 04/17/2017 09:20 AM   Modules accepted: Orders

## 2017-04-17 NOTE — Telephone Encounter (Signed)
Rx sent 

## 2017-04-18 ENCOUNTER — Other Ambulatory Visit (INDEPENDENT_AMBULATORY_CARE_PROVIDER_SITE_OTHER): Payer: PPO

## 2017-04-18 DIAGNOSIS — R9389 Abnormal findings on diagnostic imaging of other specified body structures: Secondary | ICD-10-CM

## 2017-04-18 LAB — COMPREHENSIVE METABOLIC PANEL
ALBUMIN: 4.6 g/dL (ref 3.5–5.2)
ALK PHOS: 72 U/L (ref 39–117)
ALT: 12 U/L (ref 0–53)
AST: 14 U/L (ref 0–37)
BILIRUBIN TOTAL: 0.6 mg/dL (ref 0.2–1.2)
BUN: 12 mg/dL (ref 6–23)
CO2: 29 mEq/L (ref 19–32)
CREATININE: 1.13 mg/dL (ref 0.40–1.50)
Calcium: 9.9 mg/dL (ref 8.4–10.5)
Chloride: 98 mEq/L (ref 96–112)
GFR: 69.1 mL/min (ref >60.00)
Glucose, Bld: 110 mg/dL — ABNORMAL HIGH (ref 70–99)
POTASSIUM: 4 meq/L (ref 3.5–5.1)
SODIUM: 135 meq/L (ref 135–145)
TOTAL PROTEIN: 8.4 g/dL — AB (ref 6.0–8.3)

## 2017-04-21 ENCOUNTER — Other Ambulatory Visit: Payer: Self-pay

## 2017-04-21 ENCOUNTER — Ambulatory Visit (INDEPENDENT_AMBULATORY_CARE_PROVIDER_SITE_OTHER)
Admission: RE | Admit: 2017-04-21 | Discharge: 2017-04-21 | Disposition: A | Discharge status: Home / Self Care | Payer: PPO | Source: Ambulatory Visit | Attending: Internal Medicine | Admitting: Internal Medicine

## 2017-04-21 DIAGNOSIS — R9389 Abnormal findings on diagnostic imaging of other specified body structures: Secondary | ICD-10-CM

## 2017-04-21 DIAGNOSIS — R160 Hepatomegaly, not elsewhere classified: Secondary | ICD-10-CM

## 2017-04-21 DIAGNOSIS — K7689 Other specified diseases of liver: Secondary | ICD-10-CM | POA: Diagnosis not present

## 2017-04-21 MED ORDER — IOPAMIDOL (ISOVUE-300) INJECTION 61%
100.0000 mL | Freq: Once | INTRAVENOUS | Status: AC | PRN
  Administered 2017-04-21: 100 mL via INTRAVENOUS

## 2017-04-24 ENCOUNTER — Other Ambulatory Visit: Payer: Self-pay | Admitting: Radiology

## 2017-04-25 ENCOUNTER — Encounter: Payer: Self-pay | Admitting: Internal Medicine

## 2017-04-25 ENCOUNTER — Ambulatory Visit (AMBULATORY_SURGERY_CENTER): Payer: PPO | Admitting: Internal Medicine

## 2017-04-25 ENCOUNTER — Other Ambulatory Visit: Payer: Self-pay | Admitting: General Surgery

## 2017-04-25 ENCOUNTER — Other Ambulatory Visit: Payer: Self-pay | Admitting: Student

## 2017-04-25 ENCOUNTER — Other Ambulatory Visit: Payer: Self-pay

## 2017-04-25 VITALS — BP 117/67 | HR 67 | Temp 98.9°F | Resp 18 | Ht 73.0 in | Wt 209.0 lb

## 2017-04-25 DIAGNOSIS — K222 Esophageal obstruction: Secondary | ICD-10-CM | POA: Diagnosis not present

## 2017-04-25 DIAGNOSIS — R935 Abnormal findings on diagnostic imaging of other abdominal regions, including retroperitoneum: Secondary | ICD-10-CM

## 2017-04-25 DIAGNOSIS — R011 Cardiac murmur, unspecified: Secondary | ICD-10-CM | POA: Diagnosis not present

## 2017-04-25 DIAGNOSIS — R1310 Dysphagia, unspecified: Secondary | ICD-10-CM

## 2017-04-25 DIAGNOSIS — Z1211 Encounter for screening for malignant neoplasm of colon: Secondary | ICD-10-CM | POA: Diagnosis not present

## 2017-04-25 DIAGNOSIS — D120 Benign neoplasm of cecum: Secondary | ICD-10-CM | POA: Diagnosis not present

## 2017-04-25 DIAGNOSIS — I10 Essential (primary) hypertension: Secondary | ICD-10-CM | POA: Diagnosis not present

## 2017-04-25 DIAGNOSIS — K635 Polyp of colon: Secondary | ICD-10-CM | POA: Diagnosis not present

## 2017-04-25 DIAGNOSIS — R1319 Other dysphagia: Secondary | ICD-10-CM

## 2017-04-25 MED ORDER — SODIUM CHLORIDE 0.9 % IV SOLN: 500.0000 mL | Freq: Once | INTRAVENOUS | Status: DC

## 2017-04-25 NOTE — Progress Notes (Signed)
Called to room to assist during endoscopic procedure.  Patient ID and intended procedure confirmed with present staff. Received instructions for my participation in the procedure from the performing physician.  

## 2017-04-25 NOTE — Op Note (Signed)
[facility] Patient Name: Michael Mosley Procedure Date: 04/25/2017 2:50 PM MRN: [id number] Endoscopist: Jerene Bears , MD Age: 67 Referring MD:  Date of Birth: 07-26-1950 Gender: Male Account #: [id number] Procedure:                Colonoscopy Indications:              Screening for colorectal malignant neoplasm, liver                            tumor by recent CT scan Medicines:                Monitored Anesthesia Care Procedure:                Pre-Anesthesia Assessment:                           - Prior to the procedure, a History and Physical                            was performed, and patient medications and                            allergies were reviewed. The patient's tolerance of                            previous anesthesia was also reviewed. The risks                            and benefits of the procedure and the sedation                            options and risks were discussed with the patient.                            All questions were answered, and informed consent                            was obtained. Prior Anticoagulants: The patient has                            taken no previous anticoagulant or antiplatelet                            agents. ASA Grade Assessment: III - A patient with                            severe systemic disease. After reviewing the risks                            and benefits, the patient was deemed in                            satisfactory condition to undergo the procedure.  After obtaining informed consent, the colonoscope                            was passed under direct vision. Throughout the                            procedure, the patient's blood pressure, pulse, and                            oxygen saturations were monitored continuously. The                            Colonoscope was introduced through the anus and                            advanced to the the terminal ileum.  The colonoscopy                            was performed without difficulty. The patient                            tolerated the procedure well. The quality of the                            bowel preparation was good. The terminal ileum,                            ileocecal valve, appendiceal orifice, and rectum                            were photographed. Scope In: 3:03:14 PM Scope Out: 3:14:58 PM Scope Withdrawal Time: 0 hours 9 minutes 40 seconds  Total Procedure Duration: 0 hours 11 minutes 44 seconds  Findings:                 The digital rectal exam was normal.                           A 5 mm polyp was found in the cecum. The polyp was                            sessile. The polyp was removed with a cold snare.                            Resection and retrieval were complete.                           Many small and large-mouthed diverticula were found                            in the sigmoid colon and descending colon.                           The terminal ileum appeared normal.  Internal hemorrhoids were found during                            retroflexion. The hemorrhoids were small. Complications:            No immediate complications. Estimated Blood Loss:     Estimated blood loss was minimal. Impression:               - The examined portion of the ileum was normal.                           - One 5 mm polyp in the cecum, removed with a cold                            snare. Resected and retrieved.                           - Mild diverticulosis in the sigmoid colon and in                            the descending colon.                           - Internal hemorrhoids.                           - No evidence of colonic malignancy. Recommendation:           - Patient has a contact number available for                            emergencies. The signs and symptoms of potential                            delayed complications were discussed with  the                            patient. Return to normal activities tomorrow.                            Written discharge instructions were provided to the                            patient.                           - Resume previous diet.                           - Continue present medications.                           - Await pathology results.                           - Repeat colonoscopy is recommended. The  colonoscopy date will be determined after pathology                            results from today's exam become available for                            review.                           - Proceed with US-guided liver biopsy as scheduled                            on 04/28/2017. Jerene Bears, MD 04/25/2017 3:28:07 PM This report has been signed electronically.

## 2017-04-25 NOTE — Patient Instructions (Signed)
HANDOUTS GIVEN FOR ESOPHAGITIS/STRICTURE, POLYPS, DIVERTICULOSIS, HEMORRHOIDS, POST ESOPHAGEAL DILATION DIET  YOU HAD AN ENDOSCOPIC PROCEDURE TODAY AT [facility]:   Refer to the procedure report that was given to you for any specific questions about what was found during the examination.  If the procedure report does not answer your questions, please call your gastroenterologist to clarify.  If you requested that your care partner not be given the details of your procedure findings, then the procedure report has been included in a sealed envelope for you to review at your convenience later.  YOU SHOULD EXPECT: Some feelings of bloating in the abdomen. Passage of more gas than usual.  Walking can help get rid of the air that was put into your GI tract during the procedure and reduce the bloating. If you had a lower endoscopy (such as a colonoscopy or flexible sigmoidoscopy) you may notice spotting of blood in your stool or on the toilet paper. If you underwent a bowel prep for your procedure, you may not have a normal bowel movement for a few days.  Please Note:  You might notice some irritation and congestion in your nose or some drainage.  This is from the oxygen used during your procedure.  There is no need for concern and it should clear up in a day or so.  SYMPTOMS TO REPORT IMMEDIATELY:   Following lower endoscopy (colonoscopy or flexible sigmoidoscopy):  Excessive amounts of blood in the stool  Significant tenderness or worsening of abdominal pains  Swelling of the abdomen that is new, acute  Fever of 100F or higher   Following upper endoscopy (EGD)  Vomiting of blood or coffee ground material  New chest pain or pain under the shoulder blades  Painful or persistently difficult swallowing  New shortness of breath  Fever of 100F or higher  Black, tarry-looking stools  For urgent or emergent issues, a gastroenterologist can be reached at any hour by calling [phone]  [phone].   DIET:  We do recommend a small meal at first, but then you may proceed to your regular diet.  Drink plenty of fluids but you should avoid alcoholic beverages for 24 hours.  ACTIVITY:  You should plan to take it easy for the rest of today and you should NOT DRIVE or use heavy machinery until tomorrow (because of the sedation medicines used during the test).    FOLLOW UP: Our staff will call the number listed on your records the next business day following your procedure to check on you and address any questions or concerns that you may have regarding the information given to you following your procedure. If we do not reach you, we will leave a message.  However, if you are feeling well and you are not experiencing any problems, there is no need to return our call.  We will assume that you have returned to your regular daily activities without incident.  If any biopsies were taken you will be contacted by phone or by letter within the next 1-3 weeks.  Please call us at [phone] if you have not heard about the biopsies in 3 weeks.    SIGNATURES/CONFIDENTIALITY: You and/or your care partner have signed paperwork which will be entered into your electronic medical record.  These signatures attest to the fact that that the information above on your After Visit Summary has been reviewed and is understood.  Full responsibility of the confidentiality of this discharge information lies with you and/or your care-partner.

## 2017-04-25 NOTE — Progress Notes (Signed)
Pt. Reports no change in his medical or surgical history since his pre-visit on 04/16/2017.

## 2017-04-25 NOTE — Op Note (Signed)
[facility] Patient Name: Michael Mosley Procedure Date: 04/25/2017 2:50 PM MRN: [id number] Endoscopist: Jerene Bears , MD Age: 67 Referring MD:  Date of Birth: 09-27-1950 Gender: Male Account #: [id number] Procedure:                Upper GI endoscopy Indications:              Dysphagia, Abnormal CT of the GI tract Medicines:                Monitored Anesthesia Care Procedure:                Pre-Anesthesia Assessment:                           - Prior to the procedure, a History and Physical                            was performed, and patient medications and                            allergies were reviewed. The patient's tolerance of                            previous anesthesia was also reviewed. The risks                            and benefits of the procedure and the sedation                            options and risks were discussed with the patient.                            All questions were answered, and informed consent                            was obtained. Prior Anticoagulants: The patient has                            taken no previous anticoagulant or antiplatelet                            agents. ASA Grade Assessment: III - A patient with                            severe systemic disease. After reviewing the risks                            and benefits, the patient was deemed in                            satisfactory condition to undergo the procedure.                           After obtaining informed consent, the endoscope was  passed under direct vision. Throughout the                            procedure, the patient's blood pressure, pulse, and                            oxygen saturations were monitored continuously. The                            Model GIF-[id number]) scope was introduced                            through the mouth, and advanced to the second part                            of duodenum.  The upper GI endoscopy was                            accomplished without difficulty. The patient                            tolerated the procedure well. Scope In: Scope Out: Findings:                 One moderate (circumferential scarring or stenosis;                            an endoscope may pass) benign-appearing, intrinsic                            stenosis was found 40 cm from the incisors. And was                            traversed with some resistance. There were 2                            mucosal rents seen after scope passage with                            self-limited bleeding/oozing. A TTS dilator was                            passed through the scope. Dilation with an 01-21-12                            mm balloon dilator was performed to 13 mm. The                            dilation site was examined and showed moderate                            improvement in luminal narrowing.  The exam of the esophagus was otherwise normal.                           The entire examined stomach was normal.                           The examined duodenum was normal. Complications:            No immediate complications. Estimated Blood Loss:     Estimated blood loss was minimal. Impression:               - Benign-appearing esophageal stenosis. Dilated to                            13 mm with balloon.                           - Normal stomach.                           - Normal examined duodenum.                           - No specimens collected. Recommendation:           - Patient has a contact number available for                            emergencies. The signs and symptoms of potential                            delayed complications were discussed with the                            patient. Return to normal activities tomorrow.                            Written discharge instructions were provided to the                            patient.                            - Resume previous diet.                           - Continue present medications.                           - Repeat upper endoscopy PRN for retreatment. Jerene Bears, MD 04/25/2017 3:24:20 PM This report has been signed electronically.

## 2017-04-28 ENCOUNTER — Ambulatory Visit (HOSPITAL_COMMUNITY)
Admission: RE | Admit: 2017-04-28 | Discharge: 2017-04-28 | Disposition: A | Discharge status: Home / Self Care | Payer: PPO | Source: Ambulatory Visit | Attending: Internal Medicine | Admitting: Internal Medicine

## 2017-04-28 ENCOUNTER — Encounter (HOSPITAL_COMMUNITY): Payer: Self-pay

## 2017-04-28 ENCOUNTER — Telehealth: Payer: Self-pay | Admitting: *Deleted

## 2017-04-28 DIAGNOSIS — J45909 Unspecified asthma, uncomplicated: Secondary | ICD-10-CM | POA: Insufficient documentation

## 2017-04-28 DIAGNOSIS — R160 Hepatomegaly, not elsewhere classified: Secondary | ICD-10-CM | POA: Diagnosis present

## 2017-04-28 DIAGNOSIS — Z85810 Personal history of malignant neoplasm of tongue: Secondary | ICD-10-CM | POA: Diagnosis not present

## 2017-04-28 DIAGNOSIS — C787 Secondary malignant neoplasm of liver and intrahepatic bile duct: Secondary | ICD-10-CM | POA: Insufficient documentation

## 2017-04-28 DIAGNOSIS — K7689 Other specified diseases of liver: Secondary | ICD-10-CM | POA: Diagnosis not present

## 2017-04-28 DIAGNOSIS — Z79899 Other long term (current) drug therapy: Secondary | ICD-10-CM | POA: Diagnosis not present

## 2017-04-28 DIAGNOSIS — C229 Malignant neoplasm of liver, not specified as primary or secondary: Secondary | ICD-10-CM | POA: Diagnosis not present

## 2017-04-28 DIAGNOSIS — I714 Abdominal aortic aneurysm, without rupture: Secondary | ICD-10-CM | POA: Diagnosis not present

## 2017-04-28 DIAGNOSIS — Z79890 Hormone replacement therapy (postmenopausal): Secondary | ICD-10-CM | POA: Diagnosis not present

## 2017-04-28 DIAGNOSIS — I700 Atherosclerosis of aorta: Secondary | ICD-10-CM | POA: Diagnosis not present

## 2017-04-28 DIAGNOSIS — E079 Disorder of thyroid, unspecified: Secondary | ICD-10-CM | POA: Diagnosis not present

## 2017-04-28 DIAGNOSIS — Z7982 Long term (current) use of aspirin: Secondary | ICD-10-CM | POA: Diagnosis not present

## 2017-04-28 DIAGNOSIS — E785 Hyperlipidemia, unspecified: Secondary | ICD-10-CM | POA: Diagnosis not present

## 2017-04-28 DIAGNOSIS — Z923 Personal history of irradiation: Secondary | ICD-10-CM | POA: Diagnosis not present

## 2017-04-28 DIAGNOSIS — R1310 Dysphagia, unspecified: Secondary | ICD-10-CM | POA: Diagnosis not present

## 2017-04-28 DIAGNOSIS — I10 Essential (primary) hypertension: Secondary | ICD-10-CM | POA: Insufficient documentation

## 2017-04-28 HISTORY — DX: Malignant neoplasm of liver, not specified as primary or secondary: C22.9

## 2017-04-28 LAB — PROTIME-INR
INR: 1.08
Prothrombin Time: 13.9 seconds (ref 11.4–15.2)

## 2017-04-28 LAB — CBC
HEMATOCRIT: 38.3 % — AB (ref 39.0–52.0)
Hemoglobin: 12.6 g/dL — ABNORMAL LOW (ref 13.0–17.0)
MCH: 29.7 pg (ref 26.0–34.0)
MCHC: 32.9 g/dL (ref 30.0–36.0)
MCV: 90.3 fL (ref 78.0–100.0)
Platelets: 182 10*3/uL (ref 150–400)
RBC: 4.24 MIL/uL (ref 4.22–5.81)
RDW: 13.9 % (ref 11.5–15.5)
WBC: 6 10*3/uL (ref 4.0–10.5)

## 2017-04-28 MED ORDER — FLUMAZENIL 1 MG/10ML IV SOLN
INTRAVENOUS | Status: AC
  Filled 2017-04-28: qty 10

## 2017-04-28 MED ORDER — FENTANYL CITRATE (PF) 100 MCG/2ML IJ SOLN
INTRAMUSCULAR | Status: AC | PRN
  Administered 2017-04-28 (×2): 50 ug via INTRAVENOUS

## 2017-04-28 MED ORDER — GELATIN ABSORBABLE 12-7 MM EX MISC
CUTANEOUS | Status: AC
  Filled 2017-04-28: qty 1

## 2017-04-28 MED ORDER — HYDRALAZINE HCL 20 MG/ML IJ SOLN
INTRAMUSCULAR | Status: AC
  Administered 2017-04-28: 10 mg via INTRAVENOUS
  Filled 2017-04-28: qty 1

## 2017-04-28 MED ORDER — FENTANYL CITRATE (PF) 100 MCG/2ML IJ SOLN
INTRAMUSCULAR | Status: AC
  Filled 2017-04-28: qty 4

## 2017-04-28 MED ORDER — LIDOCAINE HCL (PF) 1 % IJ SOLN
INTRAMUSCULAR | Status: AC
  Filled 2017-04-28: qty 10

## 2017-04-28 MED ORDER — MIDAZOLAM HCL 2 MG/2ML IJ SOLN
INTRAMUSCULAR | Status: AC | PRN
  Administered 2017-04-28 (×3): 1 mg via INTRAVENOUS

## 2017-04-28 MED ORDER — RAMIPRIL 2.5 MG PO CAPS
2.5000 mg | ORAL_CAPSULE | Freq: Once | ORAL | Status: AC
  Administered 2017-04-28: 2.5 mg via ORAL
  Filled 2017-04-28: qty 1

## 2017-04-28 MED ORDER — SODIUM CHLORIDE 0.9 % IV SOLN: INTRAVENOUS | Status: DC

## 2017-04-28 MED ORDER — MIDAZOLAM HCL 2 MG/2ML IJ SOLN
INTRAMUSCULAR | Status: AC
  Filled 2017-04-28: qty 4

## 2017-04-28 MED ORDER — HYDRALAZINE HCL 20 MG/ML IJ SOLN
10.0000 mg | Freq: Once | INTRAMUSCULAR | Status: AC
  Administered 2017-04-28: 10 mg via INTRAVENOUS

## 2017-04-28 MED ORDER — NALOXONE HCL 0.4 MG/ML IJ SOLN
INTRAMUSCULAR | Status: AC
  Filled 2017-04-28: qty 1

## 2017-04-28 NOTE — Discharge Instructions (Signed)
Liver Biopsy, Care After °These instructions give you information on caring for yourself after your procedure. Your doctor may also give you more specific instructions. Call your doctor if you have any problems or questions after your procedure. °Follow these instructions at home: °· Rest at home for 1-2 days or as told by your doctor. °· Have someone stay with you for at least 24 hours. °· Do not do these things in the first 24 hours: °? Drive. °? Use machinery. °? Take care of other people. °? Sign legal documents. °? Take a bath or shower. °· There are many different ways to close and cover a cut (incision). For example, a cut can be closed with stitches, skin glue, or adhesive strips. Follow your doctor's instructions on: °? Taking care of your cut. °? Changing and removing your bandage (dressing). °? Removing whatever was used to close your cut. °· Do not drink alcohol in the first week. °· Do not lift more than 5 pounds or play contact sports for the first 2 weeks. °· Take medicines only as told by your doctor. For 1 week, do not take medicine that has aspirin in it or medicines like ibuprofen. °· Get your test results. °Contact a doctor if: °· A cut bleeds and leaves more than just a small spot of blood. °· A cut is red, puffs up (swells), or hurts more than before. °· Fluid or something else comes from a cut. °· A cut smells bad. °· You have a fever or chills. °Get help right away if: °· You have swelling, bloating, or pain in your belly (abdomen). °· You get dizzy or faint. °· You have a rash. °· You feel sick to your stomach (nauseous) or throw up (vomit). °· You have trouble breathing, feel short of breath, or feel faint. °· Your chest hurts. °· You have problems talking or seeing. °· You have trouble balancing or moving your arms or legs. °This information is not intended to replace advice given to you by your health care provider. Make sure you discuss any questions you have with your health care  provider. °Document Released: 12/05/2007 Document Revised: 08/03/2015 Document Reviewed: 04/23/2013 °Elsevier Interactive Patient Education © 2018 [employer] ° ° ° ° °Moderate Conscious Sedation, Adult, Care After °These instructions provide you with information about caring for yourself after your procedure. Your health care provider may also give you more specific instructions. Your treatment has been planned according to current medical practices, but problems sometimes occur. Call your health care provider if you have any problems or questions after your procedure. °What can I expect after the procedure? °After your procedure, it is common: °· To feel sleepy for several hours. °· To feel clumsy and have poor balance for several hours. °· To have poor judgment for several hours. °· To vomit if you eat too soon. ° °Follow these instructions at home: °For at least 24 hours after the procedure: ° °· Do not: °? Participate in activities where you could fall or become injured. °? Drive. °? Use heavy machinery. °? Drink alcohol. °? Take sleeping pills or medicines that cause drowsiness. °? Make important decisions or sign legal documents. °? Take care of children on your own. °· Rest. °Eating and drinking °· Follow the diet recommended by your health care provider. °· If you vomit: °? Drink water, juice, or soup when you can drink without vomiting. °? Make sure you have little or no nausea before eating solid foods. °General instructions °·   Have a responsible adult stay with you until you are awake and alert. °· Take over-the-counter and prescription medicines only as told by your health care provider. °· If you smoke, do not smoke without supervision. °· Keep all follow-up visits as told by your health care provider. This is important. °Contact a health care provider if: °· You keep feeling nauseous or you keep vomiting. °· You feel light-headed. °· You develop a rash. °· You have a fever. °Get help right away  if: °· You have trouble breathing. °This information is not intended to replace advice given to you by your health care provider. Make sure you discuss any questions you have with your health care provider. °Document Released: 12/16/2012 Document Revised: 07/31/2015 Document Reviewed: 06/17/2015 °Elsevier Interactive Patient Education © 2018 [employer] ° ° °

## 2017-04-28 NOTE — Sedation Documentation (Signed)
Patient denies pain and is resting comfortably.  

## 2017-04-28 NOTE — H&P (Signed)
Chief Complaint: Patient was seen in consultation today for liver lesion biopsy at the request of 32 M  Referring Physician(s): 77 M  Supervising Physician: Sandi Mariscal  Patient Status: [facility] - Out-pt  History of Present Illness: Michael Mosley is a 67 y.o. male   Hx Head Neck Ca 2003 Recent intentional wt loss Dysphagia Noted chest pain----during heart eval was told to evaluate with GI-- "Dr could see something on imaging"  CT 04/21/17: IMPRESSION: 1. Large 7 cm mass lesion in the lateral segment left liver associated with smaller the lesions in the anterior right liver and medial lobe left liver. This is associated with necrotic lymphadenopathy in the gastrohepatic and hepatoduodenal ligaments. Imaging features may well be related to metastatic disease although hepatic primary with metastatic spread to the liver and upper abdomen is also consideration. No other findings of primary malignancy identified in the abdomen or pelvis. There is some fullness in the esophagogastric junction, but appearance is similar to the PET-CT of 11 years ago. 2. 3.3 cm abdominal aortic aneurysm. Recommend followup by ultrasound in 3 years.  Now scheduled for liver lesion biopsy   Past Medical History:  Diagnosis Date  . Asthma   . Cancer ([place])    tongue cancer s/p chemo/RXT, followed by Dr Sondra Come.   Marland Kitchen Heart murmur   . History of radiation therapy 02/2005   CTV 6810 cGy, PTV 6000 cGy, ETV 227 cGy  . Hyperlipidemia   . Hypertension   . Thyroid disease   . Umbilical hernia     Past Surgical History:  Procedure Laterality Date  . SHOULDER SURGERY Right     Allergies: Vitamin c; Hydrocodone; and Albuterol sulfate  Medications: Prior to Admission medications   Medication Sig Start Date End Date Taking? Authorizing Provider  acetaminophen (TYLENOL) 500 MG tablet Take 1,000 mg by mouth every 8 (eight) hours as needed for mild pain or moderate pain.   Yes  [provider]  albuterol (PROVENTIL HFA;VENTOLIN HFA) 108 (90 Base) MCG/ACT inhaler Inhale 1-2 puffs into the lungs every 4 (four) hours as needed for wheezing or shortness of breath. 12/12/16  Yes [name], MD  aspirin 81 MG tablet Take 81 mg by mouth daily.   Yes [provider]  fluticasone (FLONASE) 50 MCG/ACT nasal spray Place 2 sprays into both nostrils daily. 12/12/16  Yes [name], MD  Garlic 10 MG CAPS Take by mouth.   Yes [provider]  levothyroxine (SYNTHROID, LEVOTHROID) 75 MCG tablet Take 1 tablet (75 mcg total) by mouth daily. 03/03/17  Yes [name], MD  mometasone-formoterol [facility]) 100-5 MCG/ACT AERO Inhale 2 puffs into the lungs daily. 03/03/17  Yes [name], MD  ramipril (ALTACE) 2.5 MG capsule Take 2.5 mg by mouth daily. 04/02/17  Yes [provider]  rosuvastatin (CRESTOR) 20 MG tablet Take 20 mg by mouth daily. 03/13/17  Yes [provider]  Turmeric 500 MG CAPS Take by mouth.   Yes [provider]     History reviewed. No pertinent family history.  Social History   Socioeconomic History  . Marital status: Divorced    Spouse name: None  . Number of children: None  . Years of education: None  . Highest education level: None  Social Needs  . Financial resource strain: None  . Food insecurity - worry: None  . Food insecurity - inability: None  . Transportation needs - medical: None  . Transportation needs - non-medical: None  Occupational History  . None  Tobacco Use  . Smoking status: Never Smoker  . Smokeless tobacco: Never Used  Substance and Sexual Activity  . Alcohol use: Yes    Comment: rarely  . Drug use: No  . Sexual activity: None  Other Topics Concern  . None  Social History Narrative  . None    Review of Systems: A 12 point ROS discussed and pertinent positives are indicated in the HPI above.  All other systems are negative.  Review of Systems    Constitutional: Positive for fatigue. Negative for activity change, fever and unexpected weight change.  HENT: Positive for trouble swallowing. Negative for sore throat.   Respiratory: Negative for cough and shortness of breath.   Cardiovascular: Positive for chest pain.  Gastrointestinal: Positive for abdominal pain.  Musculoskeletal: Negative for back pain and gait problem.  Neurological: Negative for weakness.  Psychiatric/Behavioral: Negative for behavioral problems and confusion.    Vital Signs: BP 136/90 (BP Location: Right Arm)   Pulse 63   Temp 98.2 F (36.8 C) (Oral)   Ht 6\' 1"  (1.854 m)   Wt 205 lb (93 kg)   SpO2 99%   BMI 27.05 kg/m   Physical Exam  Constitutional: He is oriented to person, place, and time.  Cardiovascular: Normal rate and regular rhythm.  Murmur heard. Pulmonary/Chest: Effort normal and breath sounds normal.  Abdominal: Soft. Bowel sounds are normal.  Musculoskeletal: Normal range of motion.  Neurological: He is alert and oriented to person, place, and time.  Skin: Skin is warm and dry.  Psychiatric: He has a normal mood and affect. His behavior is normal. Judgment and thought content normal.  Nursing note and vitals reviewed.   Imaging: Ct Abdomen Pelvis W Contrast  Result Date: 04/21/2017 CLINICAL DATA:  40 pound weight loss with upper abdominal lymphadenopathy seen on outside chest CT EXAM: CT ABDOMEN AND PELVIS WITH CONTRAST TECHNIQUE: Multidetector CT imaging of the abdomen and pelvis was performed using the standard protocol following bolus administration of intravenous contrast. CONTRAST:  137mL ISOVUE-300 IOPAMIDOL (ISOVUE-300) INJECTION 61% COMPARISON:  PET-CT 05/13/2005. FINDINGS: Lower chest: Unremarkable. Hepatobiliary: 7.0 cm heterogeneously enhancing mass is identified in the lateral segment of the left liver. 12 mm low-density lesion is identified in the anterior right liver on image 16 of series 2. 8 mm low-density lesion identified  in the medial segment of the left liver on image 17 of series 2. Focal hypo attenuation in the medial segment left liver along the falciform ligament (image 26 series 2) is probably fatty deposition gallbladder unremarkable. No intrahepatic or extrahepatic biliary dilation. Pancreas: Mild pancreatic head fullness is similar to remote PET-CT. No mass lesion evident. No dilatation of the main duct. Spleen: No splenomegaly. No focal mass lesion. Adrenals/Urinary Tract: No adrenal nodule or mass. Kidneys unremarkable. No evidence for hydroureter. The urinary bladder appears normal for the degree of distention. Stomach/Bowel: Soft tissue fullness is seen in the esophagogastric junction, indeterminate by CT. The appearance is not markedly different than the remote PET-CT. Duodenum is normally positioned as is the ligament of Treitz. No small bowel wall thickening. No small bowel dilatation. The terminal ileum is normal. The appendix is normal. Diverticular changes are noted in the left colon without evidence of diverticulitis. Vascular/Lymphatic: Atherosclerotic calcification noted in the abdominal aorta with fusiform dilatation up to 3.3 cm. Bulky gastrohepatic and hepatoduodenal ligament lymphadenopathy is evident. 2.5 cm necrotic gastrohepatic ligament lymph node is seen on image 18 series 2. Another index gastrohepatic  ligament lymph node measures 2.2 cm short axis on image 21. 1.6 cm short axis retrocaval lymph node is seen on image 22. No pelvic sidewall lymphadenopathy. Reproductive: The prostate gland and seminal vesicles have normal imaging features. Other: No intraperitoneal free fluid. Musculoskeletal: Bone windows reveal no worrisome lytic or sclerotic osseous lesions. 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 IMPRESSION: 1. Large 7 cm mass lesion in the lateral segment left liver associated with smaller the lesions in the anterior right liver and medial lobe left  liver. This is associated with necrotic lymphadenopathy in the gastrohepatic and hepatoduodenal ligaments. Imaging features may well be related to metastatic disease although hepatic primary with metastatic spread to the liver and upper abdomen is also consideration. No other findings of primary malignancy identified in the abdomen or pelvis. There is some fullness in the esophagogastric junction, but appearance is similar to the PET-CT of 11 years ago. 2. 3.3 cm abdominal aortic aneurysm. Recommend followup by ultrasound in 3 years. This recommendation follows ACR consensus guidelines: White Paper of the ACR Incidental Findings Committee II on Vascular Findings. J Am Coll Radiol 2013; 37:342-876 3.  Aortic Atherosclerois (ICD10-170.0) Electronically Signed   By: Misty Stanley M.D.   On: 04/21/2017 09:37    Labs:  CBC: Recent Labs    11/26/16 1450 03/03/17 1000 04/28/17 1125  WBC 10.0 7.7 6.0  HGB 14.3 14.3 12.6*  HCT 42.6 42.7 38.3*  PLT 193 212 182    COAGS: No results for input(s): INR, APTT in the last 8760 hours.  BMP: Recent Labs    11/26/16 1450 03/03/17 1000 04/18/17 1316  NA 136 140 135  K 4.3 5.1 4.0  CL 100* 101 98  CO2 26 22 29   GLUCOSE 101* 93 110*  BUN 13 14 12   CALCIUM 9.4 9.9 9.9  CREATININE 1.10 1.21 1.13  GFRNONAA >60 62  --   GFRAA >60 72  --     LIVER FUNCTION TESTS: Recent Labs    11/26/16 1450 03/03/17 1000 04/18/17 1316  BILITOT 0.8 0.3 0.6  AST 23 18 14   ALT 20 12 12   ALKPHOS 77 99 72  PROT 8.3* 7.9 8.4*  ALBUMIN 4.5 4.7 4.6    TUMOR MARKERS: No results for input(s): AFPTM, CEA, CA199, CHROMGRNA in the last 8760 hours.  Assessment and Plan:  Hx H/N Ca 2003 Recent wt loss-- but intentional per pt Dysphagia Work up reveals liver lesion Scheduled now for biopsy of same Risks and benefits discussed with the patient including, but not limited to bleeding, infection, damage to adjacent structures or low yield requiring additional  tests.  All of the patient's questions were answered, patient is agreeable to proceed. Consent signed and in chart.   Thank you for this interesting consult.  I greatly enjoyed meeting HARPREET POMPEY and look forward to participating in their care.  A copy of this report was sent to the requesting provider on this date.  Electronically Signed: Lavonia Drafts, PA-C 04/28/2017, 12:23 PM   I spent a total of  30 Minutes   in face to face in clinical consultation, greater than 50% of which was counseling/coordinating care for liver lesion bx

## 2017-04-28 NOTE — Telephone Encounter (Signed)
  Follow up Call-  Call back number 04/25/2017  Post procedure Call Back phone  # [phone]  Permission to leave phone message Yes  Some recent data might be hidden     Patient questions:  Do you have a fever, pain , or abdominal swelling? No. Pain Score  0 *  Have you tolerated food without any problems? Yes.    Have you been able to return to your normal activities? Yes.    Do you have any questions about your discharge instructions: Diet   No. Medications  No. Follow up visit  No.  Do you have questions or concerns about your Care? No.  Actions: * If pain score is 4 or above: No action needed, pain <4.

## 2017-04-28 NOTE — Procedures (Signed)
Pre Procedure Dx: Liver mass Post Procedural Dx: Same  Technically successful US guided biopsy of indeterminate mass within the left lobe of the liver.  EBL: None  No immediate complications.   Ronny Bacon, MD Pager #: [phone]

## 2017-04-30 ENCOUNTER — Other Ambulatory Visit: Payer: Self-pay

## 2017-04-30 ENCOUNTER — Other Ambulatory Visit: Payer: Self-pay | Admitting: Cardiology

## 2017-04-30 DIAGNOSIS — I6523 Occlusion and stenosis of bilateral carotid arteries: Secondary | ICD-10-CM | POA: Diagnosis not present

## 2017-04-30 DIAGNOSIS — C221 Intrahepatic bile duct carcinoma: Secondary | ICD-10-CM

## 2017-04-30 DIAGNOSIS — I25118 Atherosclerotic heart disease of native coronary artery with other forms of angina pectoris: Secondary | ICD-10-CM | POA: Diagnosis not present

## 2017-04-30 DIAGNOSIS — R0602 Shortness of breath: Secondary | ICD-10-CM | POA: Diagnosis not present

## 2017-04-30 DIAGNOSIS — E7800 Pure hypercholesterolemia, unspecified: Secondary | ICD-10-CM | POA: Diagnosis not present

## 2017-05-09 ENCOUNTER — Inpatient Hospital Stay: Payer: PPO

## 2017-05-09 ENCOUNTER — Telehealth: Payer: Self-pay | Admitting: Oncology

## 2017-05-09 ENCOUNTER — Encounter: Payer: Self-pay | Admitting: Nurse Practitioner

## 2017-05-09 ENCOUNTER — Inpatient Hospital Stay: Payer: PPO | Attending: Nurse Practitioner | Admitting: Nurse Practitioner

## 2017-05-09 VITALS — BP 141/87 | HR 67 | Temp 97.6°F | Resp 18 | Ht 73.0 in | Wt 202.2 lb

## 2017-05-09 DIAGNOSIS — C787 Secondary malignant neoplasm of liver and intrahepatic bile duct: Secondary | ICD-10-CM | POA: Diagnosis not present

## 2017-05-09 DIAGNOSIS — E039 Hypothyroidism, unspecified: Secondary | ICD-10-CM | POA: Diagnosis not present

## 2017-05-09 DIAGNOSIS — Z79899 Other long term (current) drug therapy: Secondary | ICD-10-CM | POA: Diagnosis not present

## 2017-05-09 DIAGNOSIS — C229 Malignant neoplasm of liver, not specified as primary or secondary: Secondary | ICD-10-CM

## 2017-05-09 DIAGNOSIS — E785 Hyperlipidemia, unspecified: Secondary | ICD-10-CM | POA: Insufficient documentation

## 2017-05-09 DIAGNOSIS — R011 Cardiac murmur, unspecified: Secondary | ICD-10-CM | POA: Insufficient documentation

## 2017-05-09 DIAGNOSIS — Z8589 Personal history of malignant neoplasm of other organs and systems: Secondary | ICD-10-CM

## 2017-05-09 DIAGNOSIS — J45909 Unspecified asthma, uncomplicated: Secondary | ICD-10-CM | POA: Diagnosis not present

## 2017-05-09 DIAGNOSIS — Z8042 Family history of malignant neoplasm of prostate: Secondary | ICD-10-CM | POA: Insufficient documentation

## 2017-05-09 DIAGNOSIS — I739 Peripheral vascular disease, unspecified: Secondary | ICD-10-CM | POA: Insufficient documentation

## 2017-05-09 DIAGNOSIS — Z86010 Personal history of colonic polyps: Secondary | ICD-10-CM | POA: Insufficient documentation

## 2017-05-09 DIAGNOSIS — G4700 Insomnia, unspecified: Secondary | ICD-10-CM | POA: Insufficient documentation

## 2017-05-09 DIAGNOSIS — Z9221 Personal history of antineoplastic chemotherapy: Secondary | ICD-10-CM | POA: Diagnosis not present

## 2017-05-09 DIAGNOSIS — K5730 Diverticulosis of large intestine without perforation or abscess without bleeding: Secondary | ICD-10-CM | POA: Diagnosis not present

## 2017-05-09 DIAGNOSIS — I10 Essential (primary) hypertension: Secondary | ICD-10-CM | POA: Diagnosis not present

## 2017-05-09 DIAGNOSIS — Z803 Family history of malignant neoplasm of breast: Secondary | ICD-10-CM | POA: Insufficient documentation

## 2017-05-09 DIAGNOSIS — I714 Abdominal aortic aneurysm, without rupture: Secondary | ICD-10-CM | POA: Insufficient documentation

## 2017-05-09 DIAGNOSIS — R7989 Other specified abnormal findings of blood chemistry: Secondary | ICD-10-CM | POA: Diagnosis not present

## 2017-05-09 DIAGNOSIS — R599 Enlarged lymph nodes, unspecified: Secondary | ICD-10-CM | POA: Diagnosis not present

## 2017-05-09 DIAGNOSIS — R634 Abnormal weight loss: Secondary | ICD-10-CM | POA: Diagnosis not present

## 2017-05-09 DIAGNOSIS — I700 Atherosclerosis of aorta: Secondary | ICD-10-CM

## 2017-05-09 DIAGNOSIS — F419 Anxiety disorder, unspecified: Secondary | ICD-10-CM | POA: Diagnosis not present

## 2017-05-09 DIAGNOSIS — Z923 Personal history of irradiation: Secondary | ICD-10-CM | POA: Diagnosis not present

## 2017-05-09 LAB — CEA (IN HOUSE-CHCC): CEA (CHCC-IN HOUSE): 3.5 ng/mL (ref 0.00–5.00)

## 2017-05-09 NOTE — Progress Notes (Addendum)
New Hematology/Oncology Consult   Referral MD: Dr. Hilarie Fredrickson    Reason for Referral: Adenocarcinoma involving the liver  HPI: Michael Mosley is a 67 year old man with a remote history of head and neck cancer dating to 2006/2007.  He completed chemotherapy and radiation.  He underwent surgery for an incarcerated ventral hernia in September 2018.  He began having episodes of chest pain in late 2018, early 2019.  CT cardiac calcium scoring exam on 03/13/2017 showed shotty mediastinal lymph nodes and prominent periportal and gastrohepatic lymph nodes.  He was referred to Dr. Hilarie Fredrickson for further evaluation of the abdominal adenopathy.  CT abdomen/pelvis on 04/21/2017 showed a 7 cm mass lesion in the lateral segment of the left liver associated with smaller lesions in the anterior right liver and medial lobe left liver.  This was associated with necrotic lymphadenopathy in the gastrohepatic and hepatoduodenal ligaments.  On 04/25/2017 he underwent upper endoscopy and colonoscopy.  On the upper endoscopy he was found to have 1 moderate benign-appearing intrinsic stenosis at 40 cm from the incisors.  This was dilated.  The exam of the esophagus was otherwise normal.  The entire examined stomach and duodenum also normal.  On the colonoscopy he was found to have a 5 mm polyp in the cecum which was removed.  Pathology showed a serrated polyp with features of a sessile serrated polyp; negative for cytologic dysplasia.  He was noted to have many small and large mouth diverticula in the sigmoid colon and descending colon.  Terminal ileum appeared normal.  Internal hemorrhoids were found as well.  On 04/28/2017 he underwent biopsy of the dominant liver lesion.  Pathology showed adenocarcinoma positive for cytokeratin 7 (strong), CDX-2 (weak) and focal TTF-1.  Cytokeratin 20 essentially negative.     Past Medical History:  Diagnosis Date  . Asthma   . Cancer ([place])    tongue cancer s/p chemo/RXT, followed by Dr Sondra Come.   Marland Kitchen  Heart murmur   . History of radiation therapy 02/2005   CTV 6810 cGy, PTV 6000 cGy, ETV 227 cGy  . Hyperlipidemia   . Hypertension   . Thyroid disease   . Umbilical hernia   :   Past Surgical History:  Procedure Laterality Date  . SHOULDER SURGERY Right   : Hernia surgery September 2018, incarcerated ventral hernia   Current Outpatient Medications:  .  ibuprofen (ADVIL,MOTRIN) 200 MG tablet, Take 400 mg by mouth every 6 (six) hours as needed., Disp: , Rfl:  .  Melatonin 5 MG CAPS, Take 10 mg by mouth at bedtime., Disp: , Rfl:  .  albuterol (PROVENTIL HFA;VENTOLIN HFA) 108 (90 Base) MCG/ACT inhaler, Inhale 1-2 puffs into the lungs every 4 (four) hours as needed for wheezing or shortness of breath., Disp: 1 Inhaler, Rfl: 0 .  ezetimibe (ZETIA) 10 MG tablet, Take 10 mg by mouth daily., Disp: , Rfl:  .  fluticasone (FLONASE) 50 MCG/ACT nasal spray, Place 2 sprays into both nostrils daily., Disp: 15 g, Rfl: 11 .  Garlic 10 MG CAPS, Take by mouth., Disp: , Rfl:  .  levothyroxine (SYNTHROID, LEVOTHROID) 75 MCG tablet, Take 1 tablet (75 mcg total) by mouth daily., Disp: 90 tablet, Rfl: 3 .  ramipril (ALTACE) 2.5 MG capsule, Take 2.5 mg by mouth daily., Disp: , Rfl:  .  rosuvastatin (CRESTOR) 20 MG tablet, Take 20 mg by mouth daily., Disp: , Rfl:  .  Turmeric 500 MG CAPS, Take by mouth., Disp: , Rfl:   Current Facility-Administered Medications:  .  0.9 %  sodium chloride infusion, 500 mL, Intravenous, Once, Pyrtle, Lajuan Lines, MD:  :   Allergies  Allergen Reactions  . Vitamin C Other (See Comments)    Gets cold symptoms  . Hydrocodone   . Albuterol Sulfate Palpitations  :  FH: Father deceased, history of prostate cancer; maternal aunt with breast cancer.  No other family history of malignancy.  SOCIAL HISTORY: He lives in [place].  He is divorced.  He has 2 sons both reported to be in good health.  He is retired from a Engineer, technical sales.  He denies significant tobacco use, maybe 2 or 3  cigarettes when he was in junior high school.  He smokes marijuana 2 times a day.  He discontinued alcohol use about 6 months ago.  He does not feel he was a heavy drinker.  Review of Systems: He reports a good appetite.  He estimates losing about 40 pounds over the past 6 months.  He attributes this to a change in his diet.  He denies any bleeding.  He has had a few episodes of fever/sweats in the past month.  He denies any bleeding.  He denies pain.  Poor energy level.  He describes himself as "hyperactive".  He is no longer having the episodes of chest pain.  He has had a few dizzy spells with a near fall.  No unusual headaches or vision change.  Prior to the upper endoscopy he had dysphagia.  This has resolved.  He has dyspnea on exertion.  He coughs periodically.  He feels as if he has "chest congestion".  No nausea/vomiting.  He has had hard stools recently which he attributes to a medication.  No hematuria or dysuria.  No numbness or tingling in his hands or feet.   Physical Exam:  Blood pressure (!) 141/87, pulse 67, temperature 97.6 F (36.4 C), temperature source Oral, resp. rate 18, height 6\' 1"  (1.854 m), weight 202 lb 3.2 oz (91.7 kg), SpO2 100 %.  HEENT: PERRLA; extraocular movements intact; sclera anicteric.  Oropharynx is without thrush or ulceration. Lungs: Lungs clear bilaterally. Cardiac: Regular rate and rhythm.  Faint systolic murmur. Abdomen: Abdomen soft and nontender.  No hepatomegaly.  No mass.  No apparent ascites.  Vascular: No leg edema. Lymph nodes: No palpable cervical, supraclavicular, left axillary or inguinal adenopathy.  Slight fullness in the right axilla, question small lymph node versus prominent fat pad. Neurologic: Alert and oriented.  Motor strength 5/5.  Knee DTRs 2+, symmetric.  Finger to nose intact. Skin: No rash. Breast: No breast mass.   LABS:  No results for input(s): WBC, HGB, HCT, PLT in the last 72 hours.  No results for input(s): NA, K, CL,  CO2, GLUCOSE, BUN, CREATININE, CALCIUM in the last 72 hours.    RADIOLOGY:  Ct Abdomen Pelvis W Contrast  Result Date: 04/21/2017 CLINICAL DATA:  40 pound weight loss with upper abdominal lymphadenopathy seen on outside chest CT EXAM: CT ABDOMEN AND PELVIS WITH CONTRAST TECHNIQUE: Multidetector CT imaging of the abdomen and pelvis was performed using the standard protocol following bolus administration of intravenous contrast. CONTRAST:  16mL ISOVUE-300 IOPAMIDOL (ISOVUE-300) INJECTION 61% COMPARISON:  PET-CT 05/13/2005. FINDINGS: Lower chest: Unremarkable. Hepatobiliary: 7.0 cm heterogeneously enhancing mass is identified in the lateral segment of the left liver. 12 mm low-density lesion is identified in the anterior right liver on image 16 of series 2. 8 mm low-density lesion identified in the medial segment of the left liver on image 17 of  series 2. Focal hypo attenuation in the medial segment left liver along the falciform ligament (image 26 series 2) is probably fatty deposition gallbladder unremarkable. No intrahepatic or extrahepatic biliary dilation. Pancreas: Mild pancreatic head fullness is similar to remote PET-CT. No mass lesion evident. No dilatation of the main duct. Spleen: No splenomegaly. No focal mass lesion. Adrenals/Urinary Tract: No adrenal nodule or mass. Kidneys unremarkable. No evidence for hydroureter. The urinary bladder appears normal for the degree of distention. Stomach/Bowel: Soft tissue fullness is seen in the esophagogastric junction, indeterminate by CT. The appearance is not markedly different than the remote PET-CT. Duodenum is normally positioned as is the ligament of Treitz. No small bowel wall thickening. No small bowel dilatation. The terminal ileum is normal. The appendix is normal. Diverticular changes are noted in the left colon without evidence of diverticulitis. Vascular/Lymphatic: Atherosclerotic calcification noted in the abdominal aorta with fusiform dilatation  up to 3.3 cm. Bulky gastrohepatic and hepatoduodenal ligament lymphadenopathy is evident. 2.5 cm necrotic gastrohepatic ligament lymph node is seen on image 18 series 2. Another index gastrohepatic ligament lymph node measures 2.2 cm short axis on image 21. 1.6 cm short axis retrocaval lymph node is seen on image 22. No pelvic sidewall lymphadenopathy. Reproductive: The prostate gland and seminal vesicles have normal imaging features. Other: No intraperitoneal free fluid. Musculoskeletal: Bone windows reveal no worrisome lytic or sclerotic osseous lesions. 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 IMPRESSION: 1. Large 7 cm mass lesion in the lateral segment left liver associated with smaller the lesions in the anterior right liver and medial lobe left liver. This is associated with necrotic lymphadenopathy in the gastrohepatic and hepatoduodenal ligaments. Imaging features may well be related to metastatic disease although hepatic primary with metastatic spread to the liver and upper abdomen is also consideration. No other findings of primary malignancy identified in the abdomen or pelvis. There is some fullness in the esophagogastric junction, but appearance is similar to the PET-CT of 11 years ago. 2. 3.3 cm abdominal aortic aneurysm. Recommend followup by ultrasound in 3 years. This recommendation follows ACR consensus guidelines: White Paper of the ACR Incidental Findings Committee II on Vascular Findings. J Am Coll Radiol 2013; 94:854-627 3.  Aortic Atherosclerois (ICD10-170.0) Electronically Signed   By: Misty Stanley M.D.   On: 04/21/2017 09:37   US Biopsy (liver)  Result Date: 04/28/2017 INDICATION: Remote history of head neck cancer, now with multiple liver lesions worrisome for metastatic disease. Please perform ultrasound-guided liver lesion biopsy for tissue diagnostic purposes. EXAM: ULTRASOUND GUIDED LIVER LESION BIOPSY COMPARISON:  CT abdomen and pelvis  - 04/21/2017 MEDICATIONS: None ANESTHESIA/SEDATION: Fentanyl 3 mcg IV; Versed 100 mg IV Total Moderate Sedation time:  21 Minutes. The patient's level of consciousness and vital signs were monitored continuously by radiology nursing throughout the procedure under my direct supervision. COMPLICATIONS: None immediate. PROCEDURE: Informed written consent was obtained from the patient after a discussion of the risks, benefits and alternatives to treatment. The patient understands and consents the procedure. A timeout was performed prior to the initiation of the procedure. Ultrasound scanning was performed of the right upper abdominal quadrant demonstrates ill-defined approximately 6.3 x 5.0 cm (image 6) lesion within the lateral aspect of the dome of the left lobe of the liver correlating with the dominant mass seen on preceding abdominal CT. The procedure was planned. The midline of the upper abdomen was prepped and draped in the usual sterile fashion. The overlying soft tissues  were anesthetized with 1% lidocaine with epinephrine. A 17 gauge, 6.8 cm co-axial needle was advanced into a peripheral aspect of the lesion. This was followed by 5 core biopsies with an 18 gauge core device under direct ultrasound guidance. The coaxial needle tract was embolized with a small amount of Gel-Foam slurry and superficial hemostasis was obtained with manual compression. Post procedural scanning was negative for definitive area of hemorrhage or additional complication. A dressing was placed. The patient tolerated the procedure well without immediate post procedural complication. IMPRESSION: Technically successful ultrasound guided core needle biopsy of dominant ill-defined mass within the lateral aspect of the dome of the left lobe of the liver. Electronically Signed   By: Sandi Mariscal M.D.   On: 04/28/2017 16:34    Assessment and Plan:   1. Adenocarcinoma involving the liver 04/28/2017   CT cardiac calcium scoring exam on  03/13/2017-shotty mediastinal lymph nodes and prominent periportal and gastrohepatic lymph nodes.    CT abdomen/pelvis on 04/21/2017-7 cm mass lesion in the lateral segment of the left liver associated with smaller lesions in the anterior right liver and medial lobe left liver.  This was associated with necrotic lymphadenopathy in the gastrohepatic and hepatoduodenal ligaments.    04/25/2017 status post upper endoscopy and colonoscopy.    On the upper endoscopy he was found to have 1 moderate benign-appearing intrinsic stenosis at 40 cm from the incisors.  This was dilated.  The exam of the esophagus was otherwise normal.  The entire examined stomach and duodenum also normal.    On the colonoscopy he was found to have a 5 mm polyp in the cecum which was removed.  Pathology showed a serrated polyp with features of a sessile serrated polyp; negative for cytologic dysplasia.  He was noted to have many small and large mouth diverticula in the sigmoid colon and descending colon.  Terminal ileum appeared normal.  Internal hemorrhoids were found as well.    04/28/2017 status post biopsy of a liver lesion.  Pathology showed adenocarcinoma positive for cytokeratin 7 (strong), CDX-2 (weak) and focal TTF-1.  Cytokeratin 20 essentially negative. 2. Weight loss 3. 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 Sondra Come, Dr. Alen Blew) 4. Asthma 5. Hypertension 6. Hyperlipidemia 7. Hypothyroid 8. Peripheral vascular disease  Disposition: Michael Mosley was found to have a dominant left liver lesion, smaller lesions right and left liver and upper abdominal lymphadenopathy on an abdominal CT scan.  Biopsy of the dominant liver lesion confirmed adenocarcinoma.  No primary identified on endoscopy/colonoscopy.  Dr. Benay Spice reviewed the findings to date with Michael Mosley and his family.  CT images were reviewed on the computer with them.   They  understand findings may represent cholangiocarcinoma.  He will return to the lab today for a CEA and CA-19-9.  We are referring him for a chest CT.  We are requesting foundation 1 testing.  His case will be presented at the GI tumor conference next week.   He will return for a follow-up visit on 05/23/2017 to review outstanding data and discuss a treatment plan.  Patient seen with Dr. Benay Spice.  45 minutes were spent face-to-face at today's visit with the majority of that time involved in counseling/coordination of care.   Ned Card, NP 05/09/2017, 11:27 AM   This was a shared visit with Ned Card.  Michael Mosley was interviewed and examined.  He has been diagnosed with adenocarcinoma involving a dominant liver lesion.  There is upper abdominal adenopathy.  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  We will request molecular testing on the biopsy tissue.  We will check a CA 19-9.  I will present his case at the GI tumor conference.  Michael Mosley will return for an office visit and further discussion in 2 weeks.  I discussed treatment options including surgical resection, hepatic directed therapy, and systemic therapy with Michael Mosley and his family.  Julieanne Manson, MD

## 2017-05-09 NOTE — Progress Notes (Signed)
  Oncology Nurse Navigator Documentation  Navigator Location: [facility] (05/09/17 1338) Referral date to RadOnc/MedOnc: 04/30/17 (05/09/17 1338) )Navigator Encounter Type: Introductory phone call;Telephone (05/09/17 1338) Telephone: Outgoing Call (05/09/17 1338)  Called and left VM introducing myself and requesting that he call back to assesses for barriers to tx.  Abnormal Finding Date: 03/13/17 (05/09/17 1338) Confirmed Diagnosis Date: 04/21/17 (05/09/17 1338)                 Treatment Phase: Pre-Tx/Tx Discussion (05/09/17 1338)                  Acuity: Level 2 (05/09/17 1338)         Time Spent with Patient: 15 (05/09/17 1338)

## 2017-05-09 NOTE — Telephone Encounter (Signed)
Appointments scheduled [facility] printed.  Appt for CT made also for same day as CT Angio per 3/1 los

## 2017-05-10 LAB — CANCER ANTIGEN 19-9: CA 19-9: 845 U/mL — ABNORMAL HIGH (ref 0–35)

## 2017-05-12 ENCOUNTER — Ambulatory Visit
Admission: RE | Admit: 2017-05-12 | Discharge: 2017-05-12 | Disposition: A | Discharge status: Home / Self Care | Payer: PPO | Source: Ambulatory Visit | Attending: Nurse Practitioner | Admitting: Nurse Practitioner

## 2017-05-12 ENCOUNTER — Ambulatory Visit
Admission: RE | Admit: 2017-05-12 | Discharge: 2017-05-12 | Disposition: A | Discharge status: Home / Self Care | Payer: PPO | Source: Ambulatory Visit | Attending: Cardiology | Admitting: Cardiology

## 2017-05-12 DIAGNOSIS — I6523 Occlusion and stenosis of bilateral carotid arteries: Secondary | ICD-10-CM

## 2017-05-12 DIAGNOSIS — C229 Malignant neoplasm of liver, not specified as primary or secondary: Secondary | ICD-10-CM

## 2017-05-12 DIAGNOSIS — I700 Atherosclerosis of aorta: Secondary | ICD-10-CM | POA: Diagnosis not present

## 2017-05-12 MED ORDER — IOPAMIDOL (ISOVUE-300) INJECTION 61%
75.0000 mL | Freq: Once | INTRAVENOUS | Status: AC | PRN
  Administered 2017-05-12: 75 mL via INTRAVENOUS

## 2017-05-14 ENCOUNTER — Telehealth: Payer: Self-pay | Admitting: Emergency Medicine

## 2017-05-14 NOTE — Telephone Encounter (Addendum)
Foundation 1 requested.   ----- Message from Owens Shark, NP sent at 05/09/2017  2:57 PM EST ----- Please request foundation 1 testing on liver biopsy 04/28/2017

## 2017-05-23 ENCOUNTER — Inpatient Hospital Stay (HOSPITAL_BASED_OUTPATIENT_CLINIC_OR_DEPARTMENT_OTHER): Payer: PPO | Admitting: Oncology

## 2017-05-23 VITALS — BP 148/83 | HR 87 | Temp 98.2°F | Resp 18 | Ht 73.0 in | Wt 210.0 lb

## 2017-05-23 DIAGNOSIS — I10 Essential (primary) hypertension: Secondary | ICD-10-CM | POA: Diagnosis not present

## 2017-05-23 DIAGNOSIS — I700 Atherosclerosis of aorta: Secondary | ICD-10-CM | POA: Diagnosis not present

## 2017-05-23 DIAGNOSIS — Z803 Family history of malignant neoplasm of breast: Secondary | ICD-10-CM

## 2017-05-23 DIAGNOSIS — G4700 Insomnia, unspecified: Secondary | ICD-10-CM

## 2017-05-23 DIAGNOSIS — Z9221 Personal history of antineoplastic chemotherapy: Secondary | ICD-10-CM

## 2017-05-23 DIAGNOSIS — E039 Hypothyroidism, unspecified: Secondary | ICD-10-CM

## 2017-05-23 DIAGNOSIS — F419 Anxiety disorder, unspecified: Secondary | ICD-10-CM | POA: Diagnosis not present

## 2017-05-23 DIAGNOSIS — K5730 Diverticulosis of large intestine without perforation or abscess without bleeding: Secondary | ICD-10-CM

## 2017-05-23 DIAGNOSIS — R011 Cardiac murmur, unspecified: Secondary | ICD-10-CM | POA: Diagnosis not present

## 2017-05-23 DIAGNOSIS — R634 Abnormal weight loss: Secondary | ICD-10-CM | POA: Diagnosis not present

## 2017-05-23 DIAGNOSIS — I714 Abdominal aortic aneurysm, without rupture: Secondary | ICD-10-CM

## 2017-05-23 DIAGNOSIS — J45909 Unspecified asthma, uncomplicated: Secondary | ICD-10-CM | POA: Diagnosis not present

## 2017-05-23 DIAGNOSIS — Z79899 Other long term (current) drug therapy: Secondary | ICD-10-CM

## 2017-05-23 DIAGNOSIS — E785 Hyperlipidemia, unspecified: Secondary | ICD-10-CM

## 2017-05-23 DIAGNOSIS — R599 Enlarged lymph nodes, unspecified: Secondary | ICD-10-CM | POA: Diagnosis not present

## 2017-05-23 DIAGNOSIS — Z8589 Personal history of malignant neoplasm of other organs and systems: Secondary | ICD-10-CM

## 2017-05-23 DIAGNOSIS — I739 Peripheral vascular disease, unspecified: Secondary | ICD-10-CM

## 2017-05-23 DIAGNOSIS — C229 Malignant neoplasm of liver, not specified as primary or secondary: Secondary | ICD-10-CM | POA: Diagnosis not present

## 2017-05-23 DIAGNOSIS — Z8042 Family history of malignant neoplasm of prostate: Secondary | ICD-10-CM

## 2017-05-23 DIAGNOSIS — Z923 Personal history of irradiation: Secondary | ICD-10-CM

## 2017-05-23 DIAGNOSIS — Z86010 Personal history of colonic polyps: Secondary | ICD-10-CM

## 2017-05-23 MED ORDER — ALPRAZOLAM 0.5 MG PO TABS: 0.5000 mg | ORAL_TABLET | Freq: Two times a day (BID) | ORAL | 1 refills | Status: DC | PRN

## 2017-05-23 NOTE — Progress Notes (Signed)
[facility] OFFICE PROGRESS NOTE   Diagnosis: Metastatic adenocarcinoma  INTERVAL HISTORY:   Mr. Bress returns as scheduled.  He feels well at present.  He reports back pain resolved when he discontinued ramipril.  His appetite has been poor since completing treatment for head neck cancer many years ago.  He is eating.  No new complaint.  Objective:  Vital signs in last 24 hours:  Blood pressure (!) 148/83, pulse 87, temperature 98.2 F (36.8 C), temperature source Oral, resp. rate 18, height 6\' 1"  (1.854 m), weight 210 lb (95.3 kg), SpO2 100 %.    HEENT: Neck without mass Lymphatics: No cervical or supraclavicular nodes Resp: Lungs clear bilaterally Cardio: Regular rate and rhythm GI: No hepatomegaly, nontender Vascular: No leg edema  Skin: 3-4 mm cutaneous nodule at the left scalene area    Lab Results:  Lab Results  Component Value Date   WBC 6.0 04/28/2017   HGB 12.6 (L) 04/28/2017   HCT 38.3 (L) 04/28/2017   MCV 90.3 04/28/2017   PLT 182 04/28/2017   NEUTROABS WILL FOLLOW 03/05/2016    CMP     Component Value Date/Time   NA 135 04/18/2017 1316   NA 140 03/03/2017 1000   K 4.0 04/18/2017 1316   CL 98 04/18/2017 1316   CO2 29 04/18/2017 1316   GLUCOSE 110 (H) 04/18/2017 1316   BUN 12 04/18/2017 1316   BUN 14 03/03/2017 1000   CREATININE 1.13 04/18/2017 1316   CALCIUM 9.9 04/18/2017 1316   PROT 8.4 (H) 04/18/2017 1316   PROT 7.9 03/03/2017 1000   ALBUMIN 4.6 04/18/2017 1316   ALBUMIN 4.7 03/03/2017 1000   AST 14 04/18/2017 1316   ALT 12 04/18/2017 1316   ALKPHOS 72 04/18/2017 1316   BILITOT 0.6 04/18/2017 1316   BILITOT 0.3 03/03/2017 1000   GFRNONAA 62 03/03/2017 1000   GFRAA 72 03/03/2017 1000    Lab Results  Component Value Date   CEA1 3.50 05/09/2017    Lab Results  Component Value Date   INR 1.08 04/28/2017    Imaging:  No results found.  Medications: I have reviewed the patient's current  medications.   Assessment/Plan: 1. Adenocarcinoma involving the liver 04/28/2017   CT cardiac calcium scoring exam on 03/13/2017-shotty mediastinal lymph nodes and prominent periportal and gastrohepatic lymph nodes.    CT abdomen/pelvis on 04/21/2017-7 cm mass lesion in the lateral segment of the left liver associated with smaller lesions in the anterior right liver and medial lobe left liver.  This was associated with necrotic lymphadenopathy in the gastrohepatic and hepatoduodenal ligaments.    04/25/2017 status post upper endoscopy and colonoscopy.    On the upper endoscopy he was found to have 1 moderate benign-appearing intrinsic stenosis at 40 cm from the incisors.  This was dilated.  The exam of the esophagus was otherwise normal.  The entire examined stomach and duodenum also normal.    On the colonoscopy he was found to have a 5 mm polyp in the cecum which was removed.  Pathology showed a serrated polyp with features of a sessile serrated polyp; negative for cytologic dysplasia.  He was noted to have many small and large mouth diverticula in the sigmoid colon and descending colon.  Terminal ileum appeared normal.  Internal hemorrhoids were found as well.    04/28/2017 status post biopsy of a liver lesion.  Pathology showed adenocarcinoma positive for cytokeratin 7 (strong), CDX-2 (weak) and focal TTF-1.  Cytokeratin 20 essentially negative.  CT chest 05/12/2017-no evidence of metastatic disease or a primary chest tumor 2. Weight loss 3. 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 Sondra Come, Dr. Alen Blew) 4. Asthma 5. Hypertension 6. Hyperlipidemia 7. Hypothyroid 8. Peripheral vascular disease  Disposition: Mr. Welshans has been diagnosed with metastatic adenocarcinoma involving a liver mass.  The clinical presentation is most consistent with cholangiocarcinoma.  The CA 19-9 is elevated.  No evidence of another  primary tumor site based on review of his history, examination, and radiologic studies.  I presented his case at the GI tumor conference last week.  The surgeons feel the tumor is not resectable for cure based on the multiple liver lesions and abdominal adenopathy.  I discussed treatment options with Mr. Dozal and his family.  We discussed surgery, hepatic directed chemotherapy, and systemic therapy.  We are waiting on Foundation one results.  I explained the only potential curative therapy would be surgery.  He appears asymptomatic at present.  We decided to refer him to [facility] for a multidisciplinary opinion.  He will return for an office visit here in 2-3 weeks.  He complains of insomnia and anxiety.  We prescribed Xanax.  25 minutes were spent with the patient today.  The majority of the time was used for counseling and coordination of care.  Betsy Coder, MD  05/23/2017  3:48 PM

## 2017-05-26 ENCOUNTER — Telehealth: Payer: Self-pay | Admitting: Oncology

## 2017-05-26 ENCOUNTER — Telehealth: Payer: Self-pay | Admitting: *Deleted

## 2017-05-26 DIAGNOSIS — R0602 Shortness of breath: Secondary | ICD-10-CM | POA: Diagnosis not present

## 2017-05-26 DIAGNOSIS — C229 Malignant neoplasm of liver, not specified as primary or secondary: Secondary | ICD-10-CM

## 2017-05-26 DIAGNOSIS — E7800 Pure hypercholesterolemia, unspecified: Secondary | ICD-10-CM | POA: Diagnosis not present

## 2017-05-26 DIAGNOSIS — I6523 Occlusion and stenosis of bilateral carotid arteries: Secondary | ICD-10-CM | POA: Diagnosis not present

## 2017-05-26 DIAGNOSIS — I25118 Atherosclerotic heart disease of native coronary artery with other forms of angina pectoris: Secondary | ICD-10-CM | POA: Diagnosis not present

## 2017-05-26 MED ORDER — TRAMADOL HCL 50 MG PO TABS: 50.0000 mg | ORAL_TABLET | Freq: Four times a day (QID) | ORAL | 0 refills | Status: DC | PRN

## 2017-05-26 NOTE — Telephone Encounter (Signed)
Scheduled appt per 3/15 los - sent reminder letter in the mail - and referral sent to [facility] in [place] via inbasket for [facility]

## 2017-05-26 NOTE — Telephone Encounter (Signed)
Called pt to pick up jury duty letter.  He reports severe abdominal and back pain. Center of his back between shoulder blades. Pressure and aching in abdomen "all over". Pain returned today so he no longer thinks it is related to rosuvastatin. (He's been off med for 4 days.) Pt took Ibuprofen 1,000 mg which helped a bit but he is concerned about taking so much Ibuprofen. Message to MD for review.

## 2017-05-26 NOTE — Telephone Encounter (Signed)
Reviewed pt's call with Dr. Benay Spice. Order received and called to pharmacy.

## 2017-05-27 ENCOUNTER — Telehealth: Payer: Self-pay | Admitting: Oncology

## 2017-05-27 NOTE — Telephone Encounter (Signed)
Faxed path reports to [facility]. Susie from GI clinic will call pt with appt.

## 2017-05-30 ENCOUNTER — Encounter (HOSPITAL_COMMUNITY): Payer: Self-pay | Admitting: Oncology

## 2017-06-02 ENCOUNTER — Telehealth: Payer: Self-pay | Admitting: Oncology

## 2017-06-02 DIAGNOSIS — C221 Intrahepatic bile duct carcinoma: Secondary | ICD-10-CM | POA: Diagnosis not present

## 2017-06-02 NOTE — Telephone Encounter (Signed)
Pt appt with [facility] is 06/11/17. Surgeon appt 1:20, medical onc. Appt. 3:00. Pt is aware

## 2017-06-03 ENCOUNTER — Emergency Department (HOSPITAL_COMMUNITY): Payer: PPO

## 2017-06-03 ENCOUNTER — Telehealth: Payer: Self-pay

## 2017-06-03 ENCOUNTER — Encounter (HOSPITAL_COMMUNITY): Payer: Self-pay | Admitting: Emergency Medicine

## 2017-06-03 ENCOUNTER — Emergency Department (HOSPITAL_COMMUNITY)
Admission: EM | Admit: 2017-06-03 | Discharge: 2017-06-03 | Disposition: A | Discharge status: Home / Self Care | Payer: PPO | Attending: Emergency Medicine | Admitting: Emergency Medicine

## 2017-06-03 ENCOUNTER — Other Ambulatory Visit: Payer: Self-pay

## 2017-06-03 DIAGNOSIS — Z79899 Other long term (current) drug therapy: Secondary | ICD-10-CM | POA: Insufficient documentation

## 2017-06-03 DIAGNOSIS — D015 Carcinoma in situ of liver, gallbladder and bile ducts: Secondary | ICD-10-CM | POA: Insufficient documentation

## 2017-06-03 DIAGNOSIS — I10 Essential (primary) hypertension: Secondary | ICD-10-CM | POA: Insufficient documentation

## 2017-06-03 DIAGNOSIS — J45909 Unspecified asthma, uncomplicated: Secondary | ICD-10-CM | POA: Diagnosis not present

## 2017-06-03 DIAGNOSIS — M545 Low back pain: Secondary | ICD-10-CM | POA: Diagnosis not present

## 2017-06-03 DIAGNOSIS — M5442 Lumbago with sciatica, left side: Secondary | ICD-10-CM | POA: Diagnosis not present

## 2017-06-03 DIAGNOSIS — E039 Hypothyroidism, unspecified: Secondary | ICD-10-CM | POA: Insufficient documentation

## 2017-06-03 DIAGNOSIS — M546 Pain in thoracic spine: Secondary | ICD-10-CM | POA: Insufficient documentation

## 2017-06-03 DIAGNOSIS — C229 Malignant neoplasm of liver, not specified as primary or secondary: Secondary | ICD-10-CM | POA: Diagnosis not present

## 2017-06-03 DIAGNOSIS — R030 Elevated blood-pressure reading, without diagnosis of hypertension: Secondary | ICD-10-CM | POA: Diagnosis not present

## 2017-06-03 DIAGNOSIS — M5441 Lumbago with sciatica, right side: Secondary | ICD-10-CM | POA: Diagnosis not present

## 2017-06-03 LAB — COMPREHENSIVE METABOLIC PANEL
ALBUMIN: 4.8 g/dL (ref 3.5–5.0)
ALK PHOS: 82 U/L (ref 38–126)
ALT: 20 U/L (ref 17–63)
ANION GAP: 13 (ref 5–15)
AST: 28 U/L (ref 15–41)
BUN: 12 mg/dL (ref 6–20)
CALCIUM: 9.8 mg/dL (ref 8.9–10.3)
CO2: 25 mmol/L (ref 22–32)
Chloride: 95 mmol/L — ABNORMAL LOW (ref 101–111)
Creatinine, Ser: 0.86 mg/dL (ref 0.61–1.24)
GFR calc Af Amer: >60 mL/min (ref >60)
GFR calc non Af Amer: >60 mL/min (ref >60)
Glucose, Bld: 121 mg/dL — ABNORMAL HIGH (ref 65–99)
Potassium: 4.1 mmol/L (ref 3.5–5.1)
Sodium: 133 mmol/L — ABNORMAL LOW (ref 135–145)
Total Bilirubin: 1.5 mg/dL — ABNORMAL HIGH (ref 0.3–1.2)
Total Protein: 8.8 g/dL — ABNORMAL HIGH (ref 6.5–8.1)

## 2017-06-03 LAB — CBC WITH DIFFERENTIAL/PLATELET
BASOS ABS: 0 10*3/uL (ref 0.0–0.1)
BASOS PCT: 0 %
EOS ABS: 0 10*3/uL (ref 0.0–0.7)
Eosinophils Relative: 0 %
HCT: 40.7 % (ref 39.0–52.0)
HEMOGLOBIN: 14.2 g/dL (ref 13.0–17.0)
Lymphocytes Relative: 6 %
Lymphs Abs: 0.6 10*3/uL — ABNORMAL LOW (ref 0.7–4.0)
MCH: 30.7 pg (ref 26.0–34.0)
MCHC: 34.9 g/dL (ref 30.0–36.0)
MCV: 88.1 fL (ref 78.0–100.0)
MONOS PCT: 4 %
Monocytes Absolute: 0.4 10*3/uL (ref 0.1–1.0)
NEUTROS PCT: 90 %
Neutro Abs: 8.9 10*3/uL — ABNORMAL HIGH (ref 1.7–7.7)
Platelets: 203 10*3/uL (ref 150–400)
RBC: 4.62 MIL/uL (ref 4.22–5.81)
RDW: 13.9 % (ref 11.5–15.5)
WBC: 9.9 10*3/uL (ref 4.0–10.5)

## 2017-06-03 LAB — LIPASE, BLOOD: Lipase: 30 U/L (ref 11–51)

## 2017-06-03 MED ORDER — HYDROMORPHONE HCL 1 MG/ML IJ SOLN
1.0000 mg | Freq: Once | INTRAMUSCULAR | Status: AC
  Administered 2017-06-03: 1 mg via INTRAVENOUS
  Filled 2017-06-03: qty 1

## 2017-06-03 MED ORDER — PROMETHAZINE HCL 25 MG/ML IJ SOLN
12.5000 mg | Freq: Once | INTRAMUSCULAR | Status: AC
  Administered 2017-06-03: 12.5 mg via INTRAVENOUS
  Filled 2017-06-03: qty 1

## 2017-06-03 MED ORDER — IOPAMIDOL (ISOVUE-300) INJECTION 61%
100.0000 mL | Freq: Once | INTRAVENOUS | Status: AC | PRN
  Administered 2017-06-03: 100 mL via INTRAVENOUS

## 2017-06-03 MED ORDER — IOPAMIDOL (ISOVUE-300) INJECTION 61%
INTRAVENOUS | Status: AC
  Filled 2017-06-03: qty 100

## 2017-06-03 MED ORDER — ONDANSETRON HCL 4 MG/2ML IJ SOLN
4.0000 mg | Freq: Once | INTRAMUSCULAR | Status: AC
  Administered 2017-06-03: 4 mg via INTRAVENOUS
  Filled 2017-06-03: qty 2

## 2017-06-03 MED ORDER — DIAZEPAM 5 MG PO TABS: 5.0000 mg | ORAL_TABLET | Freq: Four times a day (QID) | ORAL | 0 refills | Status: DC | PRN

## 2017-06-03 MED ORDER — OXYCODONE-ACETAMINOPHEN 5-325 MG PO TABS
2.0000 | ORAL_TABLET | Freq: Once | ORAL | Status: AC
  Administered 2017-06-03: 2 via ORAL
  Filled 2017-06-03: qty 2

## 2017-06-03 MED ORDER — DIAZEPAM 5 MG PO TABS
5.0000 mg | ORAL_TABLET | Freq: Once | ORAL | Status: AC
  Administered 2017-06-03: 5 mg via ORAL
  Filled 2017-06-03: qty 1

## 2017-06-03 MED ORDER — OXYCODONE HCL 5 MG PO TABS: 5.0000 mg | ORAL_TABLET | ORAL | 0 refills | Status: DC | PRN

## 2017-06-03 MED ORDER — ONDANSETRON 4 MG PO TBDP: ORAL_TABLET | ORAL | 0 refills | Status: AC

## 2017-06-03 MED ORDER — PROMETHAZINE HCL 25 MG PO TABS: 12.5000 mg | ORAL_TABLET | Freq: Four times a day (QID) | ORAL | 0 refills | Status: DC | PRN

## 2017-06-03 NOTE — ED Triage Notes (Signed)
Per EMS-no recent traumatic injury-recently diagnosed for liver cancer-states back pain which started 4 hours-was walking at home-increased pain since arriving in ED-states N/V prior to EMS arriving-Zofran 4 mg ODT given in route

## 2017-06-03 NOTE — ED Provider Notes (Signed)
[facility] DEPT Provider Note   CSN: [id number] Arrival date & time: 06/03/17  5427     History   Chief Complaint Chief Complaint  Patient presents with  . Back Pain    HPI Michael Mosley is a 67 y.o. male.  The history is provided by the patient and medical records.  Back Pain   This is a new problem. The current episode started more than 1 week ago. The problem occurs constantly. The problem has been gradually worsening. The pain is associated with no known injury. The pain is present in the thoracic spine and lumbar spine. The quality of the pain is described as stabbing and aching. The pain does not radiate. The pain is moderate. The symptoms are aggravated by bending and twisting.    Past Medical History:  Diagnosis Date  . 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 Asthma   . Cancer ([place])    tongue cancer s/p chemo/RXT, followed by Dr Sondra Come.   Marland Kitchen Heart murmur   . History of radiation therapy 02/2005   CTV 6810 cGy, PTV 6000 cGy, ETV 227 cGy  . Hyperlipidemia   . Hypertension   . Thyroid disease   . Umbilical hernia     Patient Active Problem List   Diagnosis Date Noted  . 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 Sinusitis, chronic 08/18/2014  . Mild persistent chronic asthma without complication 09/01/7626  . 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 Malignant neoplasm of base of tongue ([place]) 10/09/2011  . Hypothyroidism 10/09/2011    Past Surgical History:  Procedure Laterality Date  . SHOULDER SURGERY Right         Home Medications    Prior to Admission medications   Medication Sig Start Date End Date Taking? Authorizing Provider  albuterol (PROVENTIL HFA;VENTOLIN HFA) 108 (90 Base) MCG/ACT inhaler Inhale 1-2 puffs into the lungs every 4 (four) hours as needed for wheezing or shortness of breath. 12/12/16  Yes [name], MD  ALPRAZolam Duanne Moron) 0.5 MG tablet Take 1  tablet (0.5 mg total) by mouth 2 (two) times daily as needed for anxiety. And insomnia. Do not drive while taking. 05/23/17  Yes Ladell Pier, MD  fluticasone (FLONASE) 50 MCG/ACT nasal spray Place 2 sprays into both nostrils daily. 12/12/16  Yes [name], MD  ibuprofen (ADVIL,MOTRIN) 200 MG tablet Take 400 mg by mouth every 6 (six) hours as needed.   Yes [provider]  levothyroxine (SYNTHROID, LEVOTHROID) 75 MCG tablet Take 1 tablet (75 mcg total) by mouth daily. 03/03/17  Yes [name], MD  ramipril (ALTACE) 2.5 MG capsule Take 2.5 mg by mouth daily. 04/02/17  Yes [provider]  rosuvastatin (CRESTOR) 40 MG tablet Take 40 mg by mouth daily. 05/28/17  Yes [provider]  traMADol (ULTRAM) 50 MG tablet Take 1 tablet (50 mg total) by mouth every 6 (six) hours as needed. 05/26/17  Yes Ladell Pier, MD  Turmeric 500 MG CAPS Take by mouth.   Yes [provider]  diazepam (VALIUM) 5 MG tablet Take 1 tablet (5 mg total) by mouth every 6 (six) hours as needed for anxiety (spasms). 06/03/17   Wrigley Plasencia, Corene Cornea, MD  ondansetron (ZOFRAN ODT) 4 MG disintegrating tablet 4mg  ODT q4 hours prn nausea/vomit 06/03/17   Jonn Chaikin, Corene Cornea, MD  oxyCODONE (ROXICODONE) 5 MG immediate release tablet Take 1-2 tablets (5-10 mg total) by mouth every 4 (four) hours as  needed for severe pain. 06/03/17   Chantilly Linskey, Corene Cornea, MD  promethazine (PHENERGAN) 25 MG tablet Take 0.5 tablets (12.5 mg total) by mouth every 6 (six) hours as needed for nausea or vomiting. 06/03/17   Maire Govan, Corene Cornea, MD    Family History History reviewed. No pertinent family history.  Social History Social History   Tobacco Use  . Smoking status: Never Smoker  . Smokeless tobacco: Never Used  Substance Use Topics  . Alcohol use: Yes    Comment: rarely  . Drug use: No     Allergies   Vitamin c; Hydrocodone; and Albuterol sulfate   Review of Systems Review of Systems  Musculoskeletal: Positive for  back pain.  All other systems reviewed and are negative.    Physical Exam Updated Vital Signs BP (!) 208/108 (BP Location: Right Arm)   Pulse (!) 106   Temp 98.4 F (36.9 C) (Oral)   Resp 18   SpO2 100%   Physical Exam  Constitutional: He is oriented to person, place, and time. He appears well-developed and well-nourished.  HENT:  Head: Normocephalic and atraumatic.  Eyes: Conjunctivae and EOM are normal.  Neck: Normal range of motion.  Cardiovascular: Normal rate.  Pulmonary/Chest: Effort normal. No respiratory distress.  Abdominal: He exhibits no distension.  Musculoskeletal: Normal range of motion. He exhibits tenderness (midline lower thoracic, upper lumbar spine). He exhibits no edema or deformity.  Neurological: He is alert and oriented to person, place, and time.  Nursing note and vitals reviewed.    ED Treatments / Results  Labs (all labs ordered are listed, but only abnormal results are displayed) Labs Reviewed  CBC WITH DIFFERENTIAL/PLATELET - Abnormal; Notable for the following components:      Result Value   Neutro Abs 8.9 (*)    Lymphs Abs 0.6 (*)    All other components within normal limits  COMPREHENSIVE METABOLIC PANEL - Abnormal; Notable for the following components:   Sodium 133 (*)    Chloride 95 (*)    Glucose, Bld 121 (*)    Total Protein 8.8 (*)    Total Bilirubin 1.5 (*)    All other components within normal limits  LIPASE, BLOOD  URINALYSIS, ROUTINE W REFLEX MICROSCOPIC    EKG None  Radiology Dg Thoracic Spine 4v  Result Date: 06/03/2017 CLINICAL DATA:  Mid back pain.  Evaluate for metastatic lesion. EXAM: THORACIC SPINE - 4+ VIEW COMPARISON:  None. FINDINGS: Mild degenerative spurring in the mid and lower thoracic spine. No focal bone lesion. No fracture or malalignment. IMPRESSION: No acute bony abnormality. Electronically Signed   By: Rolm Baptise M.D.   On: 06/03/2017 11:51   Dg Lumbar Spine Complete  Result Date:  06/03/2017 CLINICAL DATA:  Severe mid back pain for 3-4 weeks. History of throat cancer. Evaluate for metastatic lesion. EXAM: LUMBAR SPINE - COMPLETE 4+ VIEW COMPARISON:  CT abdomen and pelvis 04/21/2017 FINDINGS: Degenerative disc disease at L4-5 and L5-S1. Degenerative facet disease in the mid and lower lumbar spine. Normal alignment. No acute bony abnormality. No fracture or focal bone lesion. SI joints are symmetric and unremarkable. IMPRESSION: Degenerative disc and facet disease as above. No acute bony abnormality or focal bone lesion. Electronically Signed   By: Rolm Baptise M.D.   On: 06/03/2017 11:51   Ct Abdomen Pelvis W Contrast  Result Date: 06/03/2017 CLINICAL DATA:  Back pain, newly diagnosed liver cancer EXAM: CT ABDOMEN AND PELVIS WITH CONTRAST TECHNIQUE: Multidetector CT imaging of the abdomen and pelvis was  performed using the standard protocol following bolus administration of intravenous contrast. CONTRAST:  155mL ISOVUE-300 IOPAMIDOL (ISOVUE-300) INJECTION 61% COMPARISON:  04/21/2017 FINDINGS: Lower chest: Lung bases are clear. Hepatobiliary: Dominant 6.9 cm mass in segment 2 of the liver (series 2/image 13), grossly unchanged. Three additional hepatic metastases measuring up to 2.0 cm in segment 8 (series 2/image 15), previously 1.2 cm. Focal fat/altered perfusion along the falciform ligament (series 2/image 24). Additional probable focal fat along the gallbladder fossa (series 3/image 31), although indeterminate. Gallbladder is unremarkable. No intrahepatic or extrahepatic ductal dilatation. Pancreas: Within normal limits. No pancreatic atrophy or ductal dilatation. Spleen: Within normal limits. Adrenals/Urinary Tract: Adrenal glands are within normal limits. Kidneys are within normal limits.  No hydronephrosis. Bladder is within normal limits. Stomach/Bowel: Stomach is grossly unremarkable. No evidence of bowel obstruction. No colonic wall thickening or mass is evident on CT. Left colon  is decompressed. Vascular/Lymphatic: No evidence of abdominal aortic aneurysm. Atherosclerotic calcifications of the abdominal aorta and branch vessels. Extensive upper abdominal/retroperitoneal lymphadenopathy, including a dominant 2.7 cm short axis gastrohepatic node (series 2/image 18), previously 2.5 cm. No suspicious pelvic lymphadenopathy. Reproductive: Prostate is grossly unremarkable. Other: Trace pelvic ascites. Tiny fat containing right inguinal hernia. Musculoskeletal: Mild degenerative changes of the visualized thoracolumbar spine. No focal osseous lesions.  No fracture is seen. 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 Mild progression of additional hepatic metastases, as described above. Mild progression of upper abdominal/retroperitoneal nodal metastases, as described above. No focal osseous lesions.  No fracture is seen. Electronically Signed   By: Julian Hy M.D.   On: 06/03/2017 14:29    Procedures Procedures (including critical care time)  Medications Ordered in ED Medications  iopamidol (ISOVUE-300) 61 % injection (has no administration in time range)  HYDROmorphone (DILAUDID) injection 1 mg (1 mg Intravenous Given 06/03/17 1101)  ondansetron (ZOFRAN) injection 4 mg (4 mg Intravenous Given 06/03/17 1101)  diazepam (VALIUM) tablet 5 mg (5 mg Oral Given 06/03/17 1229)  HYDROmorphone (DILAUDID) injection 1 mg (1 mg Intravenous Given 06/03/17 1229)  oxyCODONE-acetaminophen (PERCOCET/ROXICET) 5-325 MG per tablet 2 tablet (2 tablets Oral Given 06/03/17 1329)  ondansetron (ZOFRAN) injection 4 mg (4 mg Intravenous Given 06/03/17 1329)  iopamidol (ISOVUE-300) 61 % injection 100 mL (100 mLs Intravenous Contrast Given 06/03/17 1358)  promethazine (PHENERGAN) injection 12.5 mg (12.5 mg Intravenous Given 06/03/17 1649)  HYDROmorphone (DILAUDID)  injection 1 mg (1 mg Intravenous Given 06/03/17 1638)     Initial Impression / Assessment and Plan / ED Course  I have reviewed the triage vital signs and the nursing notes.  Pertinent labs & imaging results that were available during my care of the patient were reviewed by me and considered in my medical decision making (see chart for details).     Patient with back pain of unclear etiology. Maybe from worsening hepatic cancer. No e/o metastatic lesion on xr or ct scan. No e/o spinal cord lesion. Pain controlled, albeit difficulty in ER. Ended up working with phenergan and dilaudid to avoid vomiting. Will dc with pain meds and nausea meds with close follow up.  Final Clinical Impressions(s) / ED Diagnoses   Final diagnoses:  Acute bilateral low back pain, with sciatica presence unspecified    ED Discharge Orders        Ordered    diazepam (VALIUM) 5 MG tablet  Every 6 hours PRN  06/03/17 1615    oxyCODONE (ROXICODONE) 5 MG immediate release tablet  Every 4 hours PRN     06/03/17 1615    ondansetron (ZOFRAN ODT) 4 MG disintegrating tablet     06/03/17 1626    promethazine (PHENERGAN) 25 MG tablet  Every 6 hours PRN     06/03/17 1635       Kalan Rinn, Corene Cornea, MD 06/03/17 1906

## 2017-06-03 NOTE — ED Notes (Signed)
Bed: [facility] Expected date:  Expected time:  Means of arrival:  Comments: EMS-syncope

## 2017-06-03 NOTE — Telephone Encounter (Signed)
17 - Spoke with pt. Pt reports severe pain "15/10 in my back". Pt states prescribed pain pills not working and making him nauseous. Also reports fever but unknown readings. Spoke with Sandi Mealy, PA and pt to come in for evaluation at clinic. Attempted to return call to pt x2 and noted that pt has been admitted to the ED. Providers made aware.

## 2017-06-05 ENCOUNTER — Telehealth: Payer: Self-pay

## 2017-06-05 NOTE — Telephone Encounter (Signed)
Returned call to pt regarding appts. Pt needed appt moved due to the timing of his appt at [facility]. Appt already moved to 4/8/ Confirmed date and time with pt, voiced understanding.

## 2017-06-06 ENCOUNTER — Other Ambulatory Visit: Payer: Self-pay | Admitting: Family Medicine

## 2017-06-06 DIAGNOSIS — J4530 Mild persistent asthma, uncomplicated: Secondary | ICD-10-CM

## 2017-06-06 NOTE — Telephone Encounter (Signed)
Albuterol HFA AER refill Last OV: 03/03/17 Last Refill:12/12/16 1 inhaler Pharmacy:[facility] [address] PCP: Dr. Carlota Raspberry

## 2017-06-06 NOTE — Telephone Encounter (Signed)
Pt following up on request for his  albuterol (PROVENTIL HFA;VENTOLIN HFA) 108 (90 Base) MCG/ACT inhaler   [place], [place] [phone] (Phone) [phone] (Fax)   Pt states he doesn't have much left to last through he weekend

## 2017-06-09 ENCOUNTER — Telehealth: Payer: Self-pay | Admitting: *Deleted

## 2017-06-09 DIAGNOSIS — C229 Malignant neoplasm of liver, not specified as primary or secondary: Secondary | ICD-10-CM

## 2017-06-09 MED ORDER — OXYCODONE HCL 5 MG PO TABS: 5.0000 mg | ORAL_TABLET | ORAL | 0 refills | Status: DC | PRN

## 2017-06-09 NOTE — Telephone Encounter (Signed)
Message from pt requesting refill of pain med. Returned call, he reports Tramadol is no longer effective. He was seen in ED for back pain and given #30 Oxycodone. Pt thinks pain is related to the statin he is taking. "I need something immediately or I'm going to have to cancel my appointment at [facility]." Pt has #4 Oxycodone tablets remaining. Reviewed with Dr. Benay Spice, informed pt he may pick up prescription on 06/10/17.

## 2017-06-10 ENCOUNTER — Ambulatory Visit: Payer: PPO | Admitting: Nurse Practitioner

## 2017-06-11 DIAGNOSIS — C787 Secondary malignant neoplasm of liver and intrahepatic bile duct: Secondary | ICD-10-CM | POA: Diagnosis not present

## 2017-06-11 DIAGNOSIS — C221 Intrahepatic bile duct carcinoma: Secondary | ICD-10-CM | POA: Diagnosis not present

## 2017-06-16 ENCOUNTER — Encounter: Payer: Self-pay | Admitting: Nurse Practitioner

## 2017-06-16 ENCOUNTER — Inpatient Hospital Stay: Payer: PPO | Attending: Nurse Practitioner | Admitting: Nurse Practitioner

## 2017-06-16 ENCOUNTER — Other Ambulatory Visit: Payer: Self-pay

## 2017-06-16 VITALS — BP 122/85 | HR 69 | Temp 98.3°F | Resp 18 | Ht 73.0 in | Wt 200.4 lb

## 2017-06-16 DIAGNOSIS — J45909 Unspecified asthma, uncomplicated: Secondary | ICD-10-CM | POA: Insufficient documentation

## 2017-06-16 DIAGNOSIS — Z5111 Encounter for antineoplastic chemotherapy: Secondary | ICD-10-CM | POA: Diagnosis not present

## 2017-06-16 DIAGNOSIS — Z923 Personal history of irradiation: Secondary | ICD-10-CM | POA: Insufficient documentation

## 2017-06-16 DIAGNOSIS — R110 Nausea: Secondary | ICD-10-CM | POA: Diagnosis not present

## 2017-06-16 DIAGNOSIS — Z7189 Other specified counseling: Secondary | ICD-10-CM

## 2017-06-16 DIAGNOSIS — C01 Malignant neoplasm of base of tongue: Secondary | ICD-10-CM

## 2017-06-16 DIAGNOSIS — C221 Intrahepatic bile duct carcinoma: Secondary | ICD-10-CM | POA: Insufficient documentation

## 2017-06-16 DIAGNOSIS — E039 Hypothyroidism, unspecified: Secondary | ICD-10-CM | POA: Insufficient documentation

## 2017-06-16 DIAGNOSIS — Z85810 Personal history of malignant neoplasm of tongue: Secondary | ICD-10-CM | POA: Diagnosis not present

## 2017-06-16 DIAGNOSIS — I10 Essential (primary) hypertension: Secondary | ICD-10-CM | POA: Diagnosis not present

## 2017-06-16 DIAGNOSIS — E785 Hyperlipidemia, unspecified: Secondary | ICD-10-CM | POA: Diagnosis not present

## 2017-06-16 DIAGNOSIS — R634 Abnormal weight loss: Secondary | ICD-10-CM | POA: Insufficient documentation

## 2017-06-16 DIAGNOSIS — Z9221 Personal history of antineoplastic chemotherapy: Secondary | ICD-10-CM | POA: Insufficient documentation

## 2017-06-16 DIAGNOSIS — I739 Peripheral vascular disease, unspecified: Secondary | ICD-10-CM | POA: Diagnosis not present

## 2017-06-16 MED ORDER — MORPHINE SULFATE ER 15 MG PO TBCR: 15.0000 mg | EXTENDED_RELEASE_TABLET | Freq: Two times a day (BID) | ORAL | 0 refills | Status: DC

## 2017-06-16 MED ORDER — LIDOCAINE-PRILOCAINE 2.5-2.5 % EX CREA: TOPICAL_CREAM | CUTANEOUS | 2 refills | Status: AC

## 2017-06-16 MED ORDER — PROMETHAZINE HCL 25 MG PO TABS: 12.5000 mg | ORAL_TABLET | Freq: Four times a day (QID) | ORAL | 0 refills | Status: DC | PRN

## 2017-06-16 NOTE — Progress Notes (Signed)
START OFF PATHWAY REGIMEN - [Other Dx]   [id number]:Cisplatin 25 mg/m2 D1,8 + Gemcitabine 1,000 mg/m2 D1,8 q21 Days:   A cycle is every 21 days:     Gemcitabine      Cisplatin   **Always confirm dose/schedule in your pharmacy ordering system**  Patient Characteristics: Intent of Therapy: Non-Curative / Palliative Intent, Discussed with Patient 

## 2017-06-16 NOTE — Progress Notes (Signed)
  Oncology Nurse Navigator Documentation  Navigator Location: [facility] (06/16/17 1600)   )Navigator Encounter Type: Follow-up Appt (06/16/17 1600) Introduced me role as GI navigator and provided contact information for questions and concerns.                         Barriers/Navigation Needs: Education (06/16/17 1600) Education: Newly Diagnosed Cancer Education(Port-A-Cath education completed. Teach back completed.) (06/16/17 1600) Interventions: Psycho-social support (06/16/17 1600)            Acuity: Level 2 (06/16/17 1600)         Time Spent with Patient: 15 (06/16/17 1600)

## 2017-06-16 NOTE — Progress Notes (Addendum)
[facility] OFFICE PROGRESS NOTE   Diagnosis: Cholangiocarcinoma  INTERVAL HISTORY:   Michael Mosley returns as scheduled.  He is having significant mid to low back pain.  The pain is exacerbated with activity.  He is taking Percocet about every 4 hours.  He has noted some improvement since beginning gabapentin.  The pain medication causes some nausea.  He takes Phenergan to relieve this.  He is constipated.  He is currently taking a stool softener and laxative.  Objective:  Vital signs in last 24 hours:  Blood pressure 122/85, pulse 69, temperature 98.3 F (36.8 C), temperature source Oral, resp. rate 18, height _0  (1.854 m), weight 200 lb 6.4 oz (90.9 kg), SpO2 97 %.    HEENT: No thrush or ulcers. Resp: Lungs clear bilaterally. Cardio: Regular rate and rhythm. GI: Abdomen soft and nontender.  No hepatomegaly. Vascular: No leg edema.  Lab Results:  Lab Results  Component Value Date   WBC 9.9 06/03/2017   HGB 14.2 06/03/2017   HCT 40.7 06/03/2017   MCV 88.1 06/03/2017   PLT 203 06/03/2017   NEUTROABS 8.9 (H) 06/03/2017    Imaging:  No results found.  Medications: I have reviewed the patient's current medications.  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   CT abdomen/pelvis on 04/21/2017-7 cm mass lesion in the lateral segment of the left liver associated with smaller lesions in the anterior right liver and medial lobe left liver. This was associated with necrotic lymphadenopathy in the gastrohepatic and hepatoduodenal ligaments.  04/25/2017 status post upper endoscopy and colonoscopy.   On the upper endoscopy he was found to have 1 moderate benign-appearing intrinsic stenosis at 40 cm from the incisors. This was dilated. The exam of the esophagus was otherwise normal. The entire examined  stomach and duodenum also normal.   On the colonoscopy he was found to have a 5 mm polyp in the cecum which was removed. Pathology showed a serrated polyp with features of a sessile serrated polyp; negative for cytologic dysplasia. He was noted to have many small and large mouth diverticula in the sigmoid colon and descending colon. Terminal ileum appeared normal. Internal hemorrhoids were found as well.   04/28/2017 status post biopsy of a liver lesion. Pathology showed adenocarcinoma positive for cytokeratin 7 (strong), CDX-2 (weak) and focal TTF-1. Cytokeratin 20 essentially negative.  CT chest 05/12/2017-no evidence of metastatic disease or a primary chest tumor  Foundation 1-microsatellite stable; tumor mutational burden 6 Muts/Mb; EXNT70 loss 2. Weight loss 3. 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 Alen Blew) 4. Asthma 5. Hypertension 6. Hyperlipidemia 7. Hypothyroid 8. Peripheral vascular disease   Disposition: Michael Mosley appears unchanged.  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  He and his family understand that no therapy will be curative.  He has seen Dr. Aleatha Borer at [facility].  Dr. Benay Spice recommends initiation of systemic therapy with gemcitabine/cisplatin.  We reviewed potential toxicities associated with chemotherapy including bone marrow toxicity, hair loss, nausea, mouth sores, diarrhea or constipation. We discussed potential toxicities associated with gemcitabine including rash, fever, pneumonitis.  We discussed the nephrotoxicity, ototoxicity and neurotoxicity associated with cisplatin.  We also discussed acute and delayed nausea associated with cisplatin.  The chemotherapy will be given on a 2-week schedule.  He agrees with this plan.  He will  attend a chemotherapy education class. We recommend placement of a Port-A-Cath.  We made a referral to the  interventional radiology department at [facility].  A prescription was sent to his pharmacy for EMLA cream.  A Phenergan prescription was refilled earlier today.  He is symptomatic with back pain likely related to the abdominal adenopathy.  He will begin MS Contin 15 mg every 12 hours.  He will continue Percocet as needed.  He understands he should not be driving while taking pain medication.  He will return for cycle 1 gemcitabine/cisplatin 06/24/2017.  We will see him in follow-up prior to cycle 2 on 07/08/2017. He will contact the office in the interim with any problems.  Patient seen with Dr. Benay Spice.  25 minutes were spent face-to-face at today's visit with the majority of that time involved in counseling/coordination of care.  Ned Card ANP/GNP-BC   06/16/2017  2:40 PM  This was a shared visit with Ned Card.  Michael Mosley was interviewed and examined.  He was evaluated by Dr. Aleatha Borer at [facility].  She agrees with the diagnosis of cholangiocarcinoma.  Surgery is not recommended.  She recommends gemcitabine/cisplatin chemotherapy.  We reviewed potential toxicities associated with this regimen and he agrees to proceed.  He will be referred for Port-A-Cath placement with the plan to begin cycle 1 gemcitabine/cisplatin on 06/24/2017.  He will attended chemotherapy teaching class. We adjusted the narcotic pain regimen today.  Julieanne Manson, MD

## 2017-06-17 ENCOUNTER — Telehealth: Payer: Self-pay | Admitting: Pharmacist

## 2017-06-17 NOTE — Telephone Encounter (Signed)
Pharmacy was contacted by patient's oncology team prior to starting chemotherapy regarding potential drug interactions with patient's new treatment and his supplement, tumeric.   No direct drug-drug interactions were found between tumeric and cisplatin/gemcitabine, but possible interactions exist with premedications of fosaprepitant and dexamethasone. Tumeric may also have some anti-platelet effects, which could impact risk of bleeding in setting of cytotoxic chemotherapy.   I discussed these findings with Michael Mosley. He takes the tumeric mostly for pain/inflammation related to his hip. He was given a prescription for morphine ER and oxycodone, which should help with his pain. I told him the steroids he will get for nausea prevention may also help with inflammation-related pain.   He is willing to hold his tumeric while on chemotherapy at this time, and will let us know if his pain gets significantly worse without it.   Thank you, Demetrius Charity, PharmD PGY2 Oncology Pharmacy Resident  Pharmacy Phone: [phone] 06/17/2017

## 2017-06-18 ENCOUNTER — Inpatient Hospital Stay: Payer: PPO

## 2017-06-19 ENCOUNTER — Other Ambulatory Visit: Payer: Self-pay | Admitting: Radiology

## 2017-06-21 ENCOUNTER — Other Ambulatory Visit: Payer: Self-pay | Admitting: Oncology

## 2017-06-21 NOTE — Progress Notes (Unsigned)
Patient calle s stating his back pain was worse, not a new problem, and he is running out of pain medicine. Wrote a 30 tab scrip for him to pick up at the [facility]

## 2017-06-22 ENCOUNTER — Other Ambulatory Visit: Payer: Self-pay | Admitting: Oncology

## 2017-06-23 ENCOUNTER — Telehealth: Payer: Self-pay

## 2017-06-23 ENCOUNTER — Ambulatory Visit (HOSPITAL_COMMUNITY)
Admission: RE | Admit: 2017-06-23 | Discharge: 2017-06-23 | Disposition: A | Discharge status: Home / Self Care | Payer: PPO | Source: Ambulatory Visit | Attending: Nurse Practitioner | Admitting: Nurse Practitioner

## 2017-06-23 ENCOUNTER — Other Ambulatory Visit: Payer: Self-pay | Admitting: Nurse Practitioner

## 2017-06-23 ENCOUNTER — Ambulatory Visit (HOSPITAL_COMMUNITY)
Admission: RE | Admit: 2017-06-23 | Discharge: 2017-06-23 | Disposition: A | Discharge status: Home / Self Care | Payer: PPO | Source: Ambulatory Visit | Attending: Oncology | Admitting: Oncology

## 2017-06-23 ENCOUNTER — Encounter (HOSPITAL_COMMUNITY): Payer: Self-pay

## 2017-06-23 DIAGNOSIS — J45909 Unspecified asthma, uncomplicated: Secondary | ICD-10-CM | POA: Insufficient documentation

## 2017-06-23 DIAGNOSIS — Z79890 Hormone replacement therapy (postmenopausal): Secondary | ICD-10-CM | POA: Diagnosis not present

## 2017-06-23 DIAGNOSIS — Z888 Allergy status to other drugs, medicaments and biological substances status: Secondary | ICD-10-CM | POA: Diagnosis not present

## 2017-06-23 DIAGNOSIS — C221 Intrahepatic bile duct carcinoma: Secondary | ICD-10-CM | POA: Insufficient documentation

## 2017-06-23 DIAGNOSIS — Z9889 Other specified postprocedural states: Secondary | ICD-10-CM | POA: Insufficient documentation

## 2017-06-23 DIAGNOSIS — Z79899 Other long term (current) drug therapy: Secondary | ICD-10-CM | POA: Insufficient documentation

## 2017-06-23 DIAGNOSIS — E785 Hyperlipidemia, unspecified: Secondary | ICD-10-CM | POA: Diagnosis not present

## 2017-06-23 DIAGNOSIS — E079 Disorder of thyroid, unspecified: Secondary | ICD-10-CM | POA: Insufficient documentation

## 2017-06-23 DIAGNOSIS — Z85810 Personal history of malignant neoplasm of tongue: Secondary | ICD-10-CM | POA: Diagnosis not present

## 2017-06-23 DIAGNOSIS — I10 Essential (primary) hypertension: Secondary | ICD-10-CM | POA: Diagnosis not present

## 2017-06-23 DIAGNOSIS — Z885 Allergy status to narcotic agent status: Secondary | ICD-10-CM | POA: Diagnosis not present

## 2017-06-23 DIAGNOSIS — Z5111 Encounter for antineoplastic chemotherapy: Secondary | ICD-10-CM | POA: Diagnosis not present

## 2017-06-23 HISTORY — PX: IR FLUORO GUIDE PORT INSERTION RIGHT: IMG5741

## 2017-06-23 HISTORY — PX: IR US GUIDE VASC ACCESS RIGHT: IMG2390

## 2017-06-23 LAB — CBC WITH DIFFERENTIAL/PLATELET
BASOS ABS: 0 10*3/uL (ref 0.0–0.1)
BASOS PCT: 0 %
Eosinophils Absolute: 0.3 10*3/uL (ref 0.0–0.7)
Eosinophils Relative: 4 %
HEMATOCRIT: 40 % (ref 39.0–52.0)
HEMOGLOBIN: 13 g/dL (ref 13.0–17.0)
LYMPHS PCT: 12 %
Lymphs Abs: 1.1 10*3/uL (ref 0.7–4.0)
MCH: 29.7 pg (ref 26.0–34.0)
MCHC: 32.5 g/dL (ref 30.0–36.0)
MCV: 91.5 fL (ref 78.0–100.0)
MONO ABS: 0.5 10*3/uL (ref 0.1–1.0)
Monocytes Relative: 6 %
NEUTROS ABS: 6.8 10*3/uL (ref 1.7–7.7)
NEUTROS PCT: 78 %
Platelets: 230 10*3/uL (ref 150–400)
RBC: 4.37 MIL/uL (ref 4.22–5.81)
RDW: 14.1 % (ref 11.5–15.5)
WBC: 8.8 10*3/uL (ref 4.0–10.5)

## 2017-06-23 LAB — PROTIME-INR
INR: 1.01
Prothrombin Time: 13.2 seconds (ref 11.4–15.2)

## 2017-06-23 MED ORDER — MORPHINE SULFATE (PF) 4 MG/ML IV SOLN
4.0000 mg | Freq: Once | INTRAVENOUS | Status: AC
Start: 2017-06-23 — End: 2017-06-23
  Administered 2017-06-23: 4 mg via INTRAVENOUS
  Filled 2017-06-23: qty 1

## 2017-06-23 MED ORDER — HEPARIN SOD (PORK) LOCK FLUSH 100 UNIT/ML IV SOLN
INTRAVENOUS | Status: AC | PRN
  Administered 2017-06-23: 500 [IU] via INTRAVENOUS

## 2017-06-23 MED ORDER — OXYCODONE HCL 5 MG PO TABS: 5.0000 mg | ORAL_TABLET | Freq: Once | ORAL | Status: DC

## 2017-06-23 MED ORDER — CEFAZOLIN SODIUM-DEXTROSE 2-4 GM/100ML-% IV SOLN
2.0000 g | INTRAVENOUS | Status: AC
  Administered 2017-06-23: 2 g via INTRAVENOUS

## 2017-06-23 MED ORDER — FENTANYL CITRATE (PF) 100 MCG/2ML IJ SOLN
INTRAMUSCULAR | Status: AC | PRN
  Administered 2017-06-23 (×2): 50 ug via INTRAVENOUS

## 2017-06-23 MED ORDER — LIDOCAINE-EPINEPHRINE (PF) 2 %-1:200000 IJ SOLN
INTRAMUSCULAR | Status: AC | PRN
  Administered 2017-06-23: 20 mL

## 2017-06-23 MED ORDER — FENTANYL CITRATE (PF) 100 MCG/2ML IJ SOLN
25.0000 ug | Freq: Once | INTRAMUSCULAR | Status: AC
  Administered 2017-06-23: 25 ug via INTRAVENOUS
  Filled 2017-06-23: qty 2

## 2017-06-23 MED ORDER — FENTANYL CITRATE (PF) 100 MCG/2ML IJ SOLN
INTRAMUSCULAR | Status: AC
  Filled 2017-06-23: qty 4

## 2017-06-23 MED ORDER — ONDANSETRON HCL 4 MG/2ML IJ SOLN
4.0000 mg | Freq: Once | INTRAMUSCULAR | Status: AC
  Administered 2017-06-23: 4 mg via INTRAVENOUS
  Filled 2017-06-23: qty 2

## 2017-06-23 MED ORDER — SODIUM CHLORIDE 0.9 % IV SOLN: INTRAVENOUS | Status: DC

## 2017-06-23 MED ORDER — CEFAZOLIN SODIUM-DEXTROSE 2-4 GM/100ML-% IV SOLN
INTRAVENOUS | Status: AC
  Administered 2017-06-23: 2 g via INTRAVENOUS
  Filled 2017-06-23: qty 100

## 2017-06-23 MED ORDER — HEPARIN SOD (PORK) LOCK FLUSH 100 UNIT/ML IV SOLN
INTRAVENOUS | Status: AC
  Filled 2017-06-23: qty 5

## 2017-06-23 MED ORDER — MIDAZOLAM HCL 2 MG/2ML IJ SOLN
INTRAMUSCULAR | Status: AC
  Filled 2017-06-23: qty 6

## 2017-06-23 MED ORDER — MIDAZOLAM HCL 2 MG/2ML IJ SOLN
INTRAMUSCULAR | Status: AC | PRN
  Administered 2017-06-23: 2 mg via INTRAVENOUS
  Administered 2017-06-23: 1 mg via INTRAVENOUS

## 2017-06-23 MED ORDER — LIDOCAINE-EPINEPHRINE (PF) 2 %-1:200000 IJ SOLN
INTRAMUSCULAR | Status: AC
  Filled 2017-06-23: qty 20

## 2017-06-23 NOTE — Discharge Instructions (Signed)
You may remove dressing and bathe in 24 hours.    Implanted [facility] Guide An implanted port is a type of central line that is placed under the skin. Central lines are used to provide IV access when treatment or nutrition needs to be given through a persons veins. Implanted ports are used for long-term IV access. An implanted port may be placed because:  You need IV medicine that would be irritating to the small veins in your hands or arms.  You need long-term IV medicines, such as antibiotics.  You need IV nutrition for a long period.  You need frequent blood draws for lab tests.  You need dialysis.  Implanted ports are usually placed in the chest area, but they can also be placed in the upper arm, the abdomen, or the leg. An implanted port has two main parts:  Reservoir. The reservoir is round and will appear as a small, raised area under your skin. The reservoir is the part where a needle is inserted to give medicines or draw blood.  Catheter. The catheter is a thin, flexible tube that extends from the reservoir. The catheter is placed into a large vein. Medicine that is inserted into the reservoir goes into the catheter and then into the vein.  How will I care for my incision site? Do not get the incision site wet. Bathe or shower as directed by your health care provider. How is my port accessed? Special steps must be taken to access the port:  Before the port is accessed, a numbing cream can be placed on the skin. This helps numb the skin over the port site.  Your health care provider uses a sterile technique to access the port. ? Your health care provider must put on a mask and sterile gloves. ? The skin over your port is cleaned carefully with an antiseptic and allowed to dry. ? The port is gently pinched between sterile gloves, and a needle is inserted into the port.  Only "non-coring" port needles should be used to access the port. Once the port is accessed, a blood  return should be checked. This helps ensure that the port is in the vein and is not clogged.  If your port needs to remain accessed for a constant infusion, a clear (transparent) bandage will be placed over the needle site. The bandage and needle will need to be changed every week, or as directed by your health care provider.  Keep the bandage covering the needle clean and dry. Do not get it wet. Follow your health care providers instructions on how to take a shower or bath while the port is accessed.  If your port does not need to stay accessed, no bandage is needed over the port.  What is flushing? Flushing helps keep the port from getting clogged. Follow your health care providers instructions on how and when to flush the port. Ports are usually flushed with saline solution or a medicine called heparin. The need for flushing will depend on how the port is used.  If the port is used for intermittent medicines or blood draws, the port will need to be flushed: ? After medicines have been given. ? After blood has been drawn. ? As part of routine maintenance.  If a constant infusion is running, the port may not need to be flushed.  How long will my port stay implanted? The port can stay in for as long as your health care provider thinks it is needed. When  it is time for the port to come out, surgery will be done to remove it. The procedure is similar to the one performed when the port was put in. When should I seek immediate medical care? When you have an implanted port, you should seek immediate medical care if:  You notice a bad smell coming from the incision site.  You have swelling, redness, or drainage at the incision site.  You have more swelling or pain at the port site or the surrounding area.  You have a fever that is not controlled with medicine.  This information is not intended to replace advice given to you by your health care provider. Make sure you discuss any questions  you have with your health care provider. Document Released: 02/25/2005 Document Revised: 08/03/2015 Document Reviewed: 11/02/2012 Elsevier Interactive Patient Education  2017 [place] Insertion, Care After This sheet gives you information about how to care for yourself after your procedure. Your health care provider may also give you more specific instructions. If you have problems or questions, contact your health care provider. What can I expect after the procedure? After your procedure, it is common to have:  Discomfort at the port insertion site.  Bruising on the skin over the port. This should improve over 3-4 days.  Follow these instructions at home: [facility] care  After your port is placed, you will get a manufacturer's information card. The card has information about your port. Keep this card with you at all times.  Take care of the port as told by your health care provider. Ask your health care provider if you or a family member can get training for taking care of the port at home. A home health care nurse may also take care of the port.  Make sure to remember what type of port you have. Incision care  Follow instructions from your health care provider about how to take care of your port insertion site. Make sure you: ? Wash your hands with soap and water before you change your bandage (dressing). If soap and water are not available, use hand sanitizer. ? Change your dressing as told by your health care provider. ? Leave stitches (sutures), skin glue, or adhesive strips in place. These skin closures may need to stay in place for 2 weeks or longer. If adhesive strip edges start to loosen and curl up, you may trim the loose edges. Do not remove adhesive strips completely unless your health care provider tells you to do that.  Check your port insertion site every day for signs of infection. Check for: ? More redness, swelling, or pain. ? More fluid or  blood. ? Warmth. ? Pus or a bad smell. General instructions  Do not take baths, swim, or use a hot tub until your health care provider approves.  Do not lift anything that is heavier than 10 lb (4.5 kg) for a week, or as told by your health care provider.  Ask your health care provider when it is okay to: ? Return to work or school. ? Resume usual physical activities or sports.  Do not drive for 24 hours if you were given a medicine to help you relax (sedative).  Take over-the-counter and prescription medicines only as told by your health care provider.  Wear a medical alert bracelet in case of an emergency. This will tell any health care providers that you have a port.  Keep all follow-up visits as told by your health  care provider. This is important. Contact a health care provider if:  You cannot flush your port with saline as directed, or you cannot draw blood from the port.  You have a fever or chills.  You have more redness, swelling, or pain around your port insertion site.  You have more fluid or blood coming from your port insertion site.  Your port insertion site feels warm to the touch.  You have pus or a bad smell coming from the port insertion site. Get help right away if:  You have chest pain or shortness of breath.  You have bleeding from your port that you cannot control. Summary  Take care of the port as told by your health care provider.  Change your dressing as told by your health care provider.  Keep all follow-up visits as told by your health care provider. This information is not intended to replace advice given to you by your health care provider. Make sure you discuss any questions you have with your health care provider. Document Released: 12/16/2012 Document Revised: 01/17/2016 Document Reviewed: 01/17/2016 Elsevier Interactive Patient Education  2017 [place].  Moderate Conscious Sedation, Adult, Care After These instructions provide  you with information about caring for yourself after your procedure. Your health care provider may also give you more specific instructions. Your treatment has been planned according to current medical practices, but problems sometimes occur. Call your health care provider if you have any problems or questions after your procedure. What can I expect after the procedure? After your procedure, it is common:  To feel sleepy for several hours.  To feel clumsy and have poor balance for several hours.  To have poor judgment for several hours.  To vomit if you eat too soon.  Follow these instructions at home: For at least 24 hours after the procedure:   Do not: ? Participate in activities where you could fall or become injured. ? Drive. ? Use heavy machinery. ? Drink alcohol. ? Take sleeping pills or medicines that cause drowsiness. ? Make important decisions or sign legal documents. ? Take care of children on your own.  Rest. Eating and drinking  Follow the diet recommended by your health care provider.  If you vomit: ? Drink water, juice, or soup when you can drink without vomiting. ? Make sure you have little or no nausea before eating solid foods. General instructions  Have a responsible adult stay with you until you are awake and alert.  Take over-the-counter and prescription medicines only as told by your health care provider.  If you smoke, do not smoke without supervision.  Keep all follow-up visits as told by your health care provider. This is important. Contact a health care provider if:  You keep feeling nauseous or you keep vomiting.  You feel light-headed.  You develop a rash.  You have a fever. Get help right away if:  You have trouble breathing. This information is not intended to replace advice given to you by your health care provider. Make sure you discuss any questions you have with your health care provider. Document Released: 12/16/2012 Document  Revised: 07/31/2015 Document Reviewed: 06/17/2015 Elsevier Interactive Patient Education  [employer].

## 2017-06-23 NOTE — Telephone Encounter (Signed)
Received triage call note on pt regarding nausea/vomiting. Spoke with pt and he states "we've got everything handled now, I took more nausea medication, I'm doing ok". This RN voiced understanding. Pt knows to call with any further questions/concerns.

## 2017-06-23 NOTE — H&P (Signed)
Referring Physician(s): Sherrill,B  Supervising Physician: Arne Cleveland  Patient Status:  [facility] OP  Chief Complaint:  "I'm getting a port a cath"  Subjective: Patient familiar to IR service from prior liver lesion biopsy on 04/28/17.  He has a history of metastatic cholangiocarcinoma and presents again today for Port-A-Cath placement for palliative chemotherapy.  He currently denies fever, headache, chest pain, dyspnea, cough, or abnormal bleeding.  He does have significant abdominal/back pain, occasional nausea and vomiting. Past Medical History:  Diagnosis Date  . 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 Asthma   . Cancer ([place])    tongue cancer s/p chemo/RXT, followed by Dr Sondra Come.   Marland Kitchen Heart murmur   . History of radiation therapy 02/2005   CTV 6810 cGy, PTV 6000 cGy, ETV 227 cGy  . Hyperlipidemia   . Hypertension   . Thyroid disease   . Umbilical hernia    Past Surgical History:  Procedure Laterality Date  . SHOULDER SURGERY Right      Allergies: Vitamin c; Hydrocodone; and Albuterol sulfate  Medications: Prior to Admission medications   Medication Sig Start Date End Date Taking? Authorizing Provider  albuterol (PROVENTIL HFA;VENTOLIN HFA) 108 (90 Base) MCG/ACT inhaler INHALE 1 TO 2 PUFFS INTO LUNGS EVERY 4 HOURS AS NEEDED FOR WHEEZING OR SHORTNESS OF BREATH 06/06/17  Yes [name], MD  ALPRAZolam Duanne Moron) 0.5 MG tablet Take 1 tablet (0.5 mg total) by mouth 2 (two) times daily as needed for anxiety. And insomnia. Do not drive while taking. 05/23/17  Yes Ladell Pier, MD  diazepam (VALIUM) 5 MG tablet Take 1 tablet (5 mg total) by mouth every 6 (six) hours as needed for anxiety (spasms). 06/03/17  Yes Mesner, Corene Cornea, MD  fluticasone (FLONASE) 50 MCG/ACT nasal spray Place 2 sprays into both nostrils daily. 12/12/16  Yes [name], MD  levothyroxine (SYNTHROID, LEVOTHROID) 75 MCG tablet Take 1 tablet (75 mcg total) by mouth daily.  03/03/17  Yes [name], MD  ondansetron (ZOFRAN ODT) 4 MG disintegrating tablet 4mg  ODT q4 hours prn nausea/vomit 06/03/17  Yes Mesner, Corene Cornea, MD  oxyCODONE (ROXICODONE) 5 MG immediate release tablet Take 1-2 tablets (5-10 mg total) by mouth every 4 (four) hours as needed for severe pain. 06/09/17  Yes Ladell Pier, MD  promethazine (PHENERGAN) 25 MG tablet Take 0.5 tablets (12.5 mg total) by mouth every 6 (six) hours as needed for nausea or vomiting. 06/16/17  Yes Ladell Pier, MD  Turmeric 500 MG CAPS Take by mouth.   Yes [provider]  ibuprofen (ADVIL,MOTRIN) 200 MG tablet Take 400 mg by mouth every 6 (six) hours as needed.    [provider]  lidocaine-prilocaine (EMLA) cream Apply to portacath site 1-2 hours prior to use 06/16/17   Owens Shark, NP  morphine (MS CONTIN) 15 MG 12 hr tablet Take 1 tablet (15 mg total) by mouth every 12 (twelve) hours. 06/16/17   Owens Shark, NP  ramipril (ALTACE) 2.5 MG capsule Take 2.5 mg by mouth daily. 04/02/17   [provider]  rosuvastatin (CRESTOR) 40 MG tablet Take 40 mg by mouth daily. 05/28/17   [provider]  traMADol (ULTRAM) 50 MG tablet Take 1 tablet (50 mg total) by mouth every 6 (six) hours as needed. 05/26/17   Ladell Pier, MD     Vital Signs: Pulse 65   Temp 97.7 F (36.5 C) (Oral)   Resp 18  SpO2 100%   Physical Exam awake, alert.  Complaining of abdominal discomfort.  Chest clear to auscultation bilaterally.  Heart with regular rate and rhythm, positive murmur.  Abdomen soft, positive bowel sounds, mild to moderate generalized tenderness to palpation.  No lower extremity edema.  Imaging: No results found.  Labs:  CBC: Recent Labs    03/03/17 1000 04/28/17 1125 06/03/17 1105 06/23/17 1023  WBC 7.7 6.0 9.9 8.8  HGB 14.3 12.6* 14.2 13.0  HCT 42.7 38.3* 40.7 40.0  PLT 212 182 203 230    COAGS: Recent Labs    04/28/17 1125 06/23/17 1023  INR 1.08 1.01     BMP: Recent Labs    11/26/16 1450 03/03/17 1000 04/18/17 1316 06/03/17 1105  NA 136 140 135 133*  K 4.3 5.1 4.0 4.1  CL 100* 101 98 95*  CO2 26 22 29 25   GLUCOSE 101* 93 110* 121*  BUN 13 14 12 12   CALCIUM 9.4 9.9 9.9 9.8  CREATININE 1.10 1.21 1.13 0.86  GFRNONAA >60 62  --  >60  GFRAA >60 72  --  >60    LIVER FUNCTION TESTS: Recent Labs    11/26/16 1450 03/03/17 1000 04/18/17 1316 06/03/17 1105  BILITOT 0.8 0.3 0.6 1.5*  AST 23 18 14 28   ALT 20 12 12 20   ALKPHOS 77 99 72 82  PROT 8.3* 7.9 8.4* 8.8*  ALBUMIN 4.5 4.7 4.6 4.8    Assessment and Plan: Pt with history of metastatic cholangiocarcinoma ;presents  today for Port-A-Cath placement for palliative chemotherapy. Risks and benefits of image guided port-a-catheter placement was discussed with the patient/sister including, but not limited to bleeding, infection, pneumothorax, or fibrin sheath development and need for additional procedures.  All of the patient's questions were answered, patient is agreeable to proceed. Consent signed and in chart.     Electronically Signed: D. Rowe Robert, PA-C 06/23/2017, 11:08 AM   I spent a total of 20 minutes  at the the patient's bedside AND on the patient's hospital floor or unit, greater than 50% of which was counseling/coordinating care for Port-A-Cath placement

## 2017-06-23 NOTE — Progress Notes (Signed)
This nurse contacted PA Rowe Robert about 10/10 pain at 1400. Verbal orders given to this nurse for at home PO pain medication. Upon entering the room to update patient and sister of plan for treating pain, sister made nurse aware that she gave him his at home pain medication (Percocet) before nurse entered the room. This nurse will not give patient medication prescribed via verbal order.

## 2017-06-23 NOTE — Procedures (Signed)
  Procedure: R IJ Port    EBL:   minimal Complications:  none immediate  See full dictation in [facility] PACS.  D. Clarance Bollard MD Main # [phone] Pager  [phone]    

## 2017-06-24 ENCOUNTER — Telehealth: Payer: Self-pay

## 2017-06-24 DIAGNOSIS — C01 Malignant neoplasm of base of tongue: Secondary | ICD-10-CM

## 2017-06-24 MED ORDER — OXYCODONE-ACETAMINOPHEN 5-325 MG PO TABS: 1.0000 | ORAL_TABLET | ORAL | 0 refills | Status: DC | PRN

## 2017-06-24 NOTE — Telephone Encounter (Signed)
Call from pt sister stating "the on-call dr gave him something for pain, he's running out and will need that refilled". Chart indicates that Dr. Jana Hakim spoke with pt on 4/13 and prescribed 30 tabs of pain medication. Per [place] via Elgie Congo, RN he prescribed percocet. Spoke with Dr. Benay Spice and percocet prescribed. Pt informed and voiced understanding.

## 2017-06-25 ENCOUNTER — Telehealth: Payer: Self-pay | Admitting: *Deleted

## 2017-06-25 ENCOUNTER — Other Ambulatory Visit: Payer: PPO

## 2017-06-25 ENCOUNTER — Ambulatory Visit: Payer: PPO

## 2017-06-25 DIAGNOSIS — C221 Intrahepatic bile duct carcinoma: Secondary | ICD-10-CM

## 2017-06-25 MED ORDER — ONDANSETRON HCL 8 MG PO TABS: 8.0000 mg | ORAL_TABLET | Freq: Three times a day (TID) | ORAL | 2 refills | Status: AC | PRN

## 2017-06-25 MED ORDER — MORPHINE SULFATE ER 15 MG PO TBCR: 30.0000 mg | EXTENDED_RELEASE_TABLET | Freq: Two times a day (BID) | ORAL | 0 refills | Status: DC

## 2017-06-25 NOTE — Telephone Encounter (Signed)
Son, Michael Mosley here for pain prescription pick-up.  Reports he is also to receive something for nausea. Notified of [facility]'s report the anti-emetic ordered 06-16-2017 was picked up 06-16-2017 for $6.00.    "He needs something stronger, was for treatment today but there is no way he could have sat in a chair today.  Having more pain since port-a-cath placed to upper abdomen,lower chest that moves around to his back.  Has vomited twice and needs something long acting.  Will get this filled and call back for pick up.   Return number [phone]."  Advised this nurse left message for providers nurse who will contact him with provider instructions or orders.

## 2017-06-25 NOTE — Telephone Encounter (Signed)
Spoke with son and pt sister, Collie Siad. Informed that pt has MS Contin that is long acting pain medication along with percocet for pain relief. Pt son states that pt is in a lot of pain that isn't controlled well. Son requested to increase MS Contin to 30mg .  Call from Collie Siad stating patient has had another episode of nausea. Wants to know what to do.   Per Dr. Benay Spice, ok to increase MS Contin to 30mg . Pt to take phenergan for nausea and monitor. Also prescribing Zofran. Pt son voiced understanding of information      MD notified of missed chemo appt. Will reschedule.

## 2017-06-26 ENCOUNTER — Inpatient Hospital Stay: Payer: PPO | Admitting: Oncology

## 2017-07-01 ENCOUNTER — Telehealth: Payer: Self-pay | Admitting: Emergency Medicine

## 2017-07-01 ENCOUNTER — Telehealth: Payer: Self-pay | Admitting: Oncology

## 2017-07-01 ENCOUNTER — Inpatient Hospital Stay: Payer: PPO

## 2017-07-01 ENCOUNTER — Telehealth: Payer: Self-pay

## 2017-07-01 ENCOUNTER — Other Ambulatory Visit: Payer: Self-pay | Admitting: Emergency Medicine

## 2017-07-01 VITALS — BP 143/95 | HR 79 | Temp 98.0°F | Resp 16

## 2017-07-01 DIAGNOSIS — C221 Intrahepatic bile duct carcinoma: Secondary | ICD-10-CM

## 2017-07-01 DIAGNOSIS — Z5111 Encounter for antineoplastic chemotherapy: Secondary | ICD-10-CM | POA: Diagnosis not present

## 2017-07-01 DIAGNOSIS — C01 Malignant neoplasm of base of tongue: Secondary | ICD-10-CM

## 2017-07-01 LAB — CMP (CANCER CENTER ONLY)
ALBUMIN: 4.1 g/dL (ref 3.5–5.0)
ALK PHOS: 96 U/L (ref 40–150)
ALT: 14 U/L (ref 0–55)
AST: 18 U/L (ref 5–34)
Anion gap: 13 — ABNORMAL HIGH (ref 3–11)
BILIRUBIN TOTAL: 0.6 mg/dL (ref 0.2–1.2)
BUN: 14 mg/dL (ref 7–26)
CALCIUM: 9.9 mg/dL (ref 8.4–10.4)
CO2: 20 mmol/L — AB (ref 22–29)
Chloride: 96 mmol/L — ABNORMAL LOW (ref 98–109)
Creatinine: 1.03 mg/dL (ref 0.70–1.30)
GFR, Est AFR Am: >60 mL/min (ref >60)
GFR, Estimated: >60 mL/min (ref >60)
GLUCOSE: 104 mg/dL (ref 70–140)
POTASSIUM: 4.1 mmol/L (ref 3.5–5.1)
Sodium: 129 mmol/L — ABNORMAL LOW (ref 136–145)
TOTAL PROTEIN: 7.9 g/dL (ref 6.4–8.3)

## 2017-07-01 LAB — CBC WITH DIFFERENTIAL (CANCER CENTER ONLY)

## 2017-07-01 LAB — CANCER ANTIGEN 19-9

## 2017-07-01 MED ORDER — PALONOSETRON HCL INJECTION 0.25 MG/5ML
INTRAVENOUS | Status: AC
  Filled 2017-07-01: qty 5

## 2017-07-01 MED ORDER — PROMETHAZINE HCL 25 MG PO TABS: 12.5000 mg | ORAL_TABLET | Freq: Four times a day (QID) | ORAL | 0 refills | Status: DC | PRN

## 2017-07-01 MED ORDER — SODIUM CHLORIDE 0.9 % IV SOLN
Freq: Once | INTRAVENOUS | Status: AC
  Administered 2017-07-01: 13:00:00 via INTRAVENOUS
  Filled 2017-07-01: qty 5

## 2017-07-01 MED ORDER — HEPARIN SOD (PORK) LOCK FLUSH 100 UNIT/ML IV SOLN
500.0000 [IU] | Freq: Once | INTRAVENOUS | Status: AC | PRN
  Administered 2017-07-01: 500 [IU]
  Filled 2017-07-01: qty 5

## 2017-07-01 MED ORDER — DEXTROSE-NACL 5-0.45 % IV SOLN
Freq: Once | INTRAVENOUS | Status: AC
  Administered 2017-07-01: 10:00:00 via INTRAVENOUS
  Filled 2017-07-01: qty 10

## 2017-07-01 MED ORDER — SODIUM CHLORIDE 0.9% FLUSH
10.0000 mL | INTRAVENOUS | Status: DC | PRN
  Administered 2017-07-01: 10 mL
  Filled 2017-07-01: qty 10

## 2017-07-01 MED ORDER — SODIUM CHLORIDE 0.9 % IV SOLN
Freq: Once | INTRAVENOUS | Status: AC
  Administered 2017-07-01: 10:00:00 via INTRAVENOUS

## 2017-07-01 MED ORDER — PALONOSETRON HCL INJECTION 0.25 MG/5ML
0.2500 mg | Freq: Once | INTRAVENOUS | Status: AC
  Administered 2017-07-01: 0.25 mg via INTRAVENOUS

## 2017-07-01 MED ORDER — SODIUM CHLORIDE 0.9 % IV SOLN
25.0000 mg/m2 | Freq: Once | INTRAVENOUS | Status: AC
  Administered 2017-07-01: 54 mg via INTRAVENOUS
  Filled 2017-07-01: qty 54

## 2017-07-01 MED ORDER — SODIUM CHLORIDE 0.9 % IV SOLN
1000.0000 mg/m2 | Freq: Once | INTRAVENOUS | Status: AC
  Administered 2017-07-01: 2166 mg via INTRAVENOUS
  Filled 2017-07-01: qty 56.97

## 2017-07-01 NOTE — Progress Notes (Signed)
Confirmed w/ Dr. Benay Spice: tx will be q2weeks.  Ok'd by Dr. Benay Spice to remove Day 8 from each planned cycle. Kennith Center, Pharm.D., CPP 07/01/2017@12 :03 PM

## 2017-07-01 NOTE — Telephone Encounter (Signed)
err

## 2017-07-01 NOTE — Telephone Encounter (Signed)
Pts friend [place]) Called and states pts can not take zofran. Causes pt to have dizziness and muscle jerks. Pt would like to continue with phenergan. Ok to refill for 30 day supply per Dr.Sherrill. Pt friend verbalized understanding.

## 2017-07-01 NOTE — Telephone Encounter (Signed)
R/s appt per 4/23 staff message from [place] - patient to get an updated schedule from treatment area - appt r/s from 5/1 to 5/7 .

## 2017-07-01 NOTE — Patient Instructions (Signed)
[facility] Discharge Instructions for Patients Receiving Chemotherapy  Today you received the following chemotherapy agents: Cisplatin and Gemzar.   To help prevent nausea and vomiting after your treatment, we encourage you to take your nausea medication as directed.   If you develop nausea and vomiting that is not controlled by your nausea medication, call the clinic.   BELOW ARE SYMPTOMS THAT SHOULD BE REPORTED IMMEDIATELY:  *FEVER GREATER THAN 100.5 F  *CHILLS WITH OR WITHOUT FEVER  NAUSEA AND VOMITING THAT IS NOT CONTROLLED WITH YOUR NAUSEA MEDICATION  *UNUSUAL SHORTNESS OF BREATH  *UNUSUAL BRUISING OR BLEEDING  TENDERNESS IN MOUTH AND THROAT WITH OR WITHOUT PRESENCE OF ULCERS  *URINARY PROBLEMS  *BOWEL PROBLEMS  UNUSUAL RASH Items with * indicate a potential emergency and should be followed up as soon as possible.  Feel free to call the clinic should you have any questions or concerns. The clinic phone number is [phone].  Please show the [place] at check-in to the Emergency Department and triage nurse.   Cisplatin injection What is this medicine? CISPLATIN (SIS pla tin) is a chemotherapy drug. It targets fast dividing cells, like cancer cells, and causes these cells to die. This medicine is used to treat many types of cancer like bladder, ovarian, and testicular cancers. This medicine may be used for other purposes; ask your health care provider or pharmacist if you have questions. COMMON BRAND NAME(S): Platinol, Platinol -AQ What should I tell my health care provider before I take this medicine? They need to know if you have any of these conditions: -blood disorders -hearing problems -kidney disease -recent or ongoing radiation therapy -an unusual or allergic reaction to cisplatin, carboplatin, other chemotherapy, other medicines, foods, dyes, or preservatives -pregnant or trying to get pregnant -breast-feeding How should I use  this medicine? This drug is given as an infusion into a vein. It is administered in a hospital or clinic by a specially trained health care professional. Talk to your pediatrician regarding the use of this medicine in children. Special care may be needed. Overdosage: If you think you have taken too much of this medicine contact a poison control center or emergency room at once. NOTE: This medicine is only for you. Do not share this medicine with others. What if I miss a dose? It is important not to miss a dose. Call your doctor or health care professional if you are unable to keep an appointment. What may interact with this medicine? -dofetilide -foscarnet -medicines for seizures -medicines to increase blood counts like filgrastim, pegfilgrastim, sargramostim -probenecid -pyridoxine used with altretamine -rituximab -some antibiotics like amikacin, gentamicin, neomycin, polymyxin B, streptomycin, tobramycin -sulfinpyrazone -vaccines -zalcitabine Talk to your doctor or health care professional before taking any of these medicines: -acetaminophen -aspirin -ibuprofen -ketoprofen -naproxen This list may not describe all possible interactions. Give your health care provider a list of all the medicines, herbs, non-prescription drugs, or dietary supplements you use. Also tell them if you smoke, drink alcohol, or use illegal drugs. Some items may interact with your medicine. What should I watch for while using this medicine? Your condition will be monitored carefully while you are receiving this medicine. You will need important blood work done while you are taking this medicine. This drug may make you feel generally unwell. This is not uncommon, as chemotherapy can affect healthy cells as well as cancer cells. Report any side effects. Continue your course of treatment even though you feel ill unless your doctor  tells you to stop. In some cases, you may be given additional medicines to help with  side effects. Follow all directions for their use. Call your doctor or health care professional for advice if you get a fever, chills or sore throat, or other symptoms of a cold or flu. Do not treat yourself. This drug decreases your body's ability to fight infections. Try to avoid being around people who are sick. This medicine may increase your risk to bruise or bleed. Call your doctor or health care professional if you notice any unusual bleeding. Be careful brushing and flossing your teeth or using a toothpick because you may get an infection or bleed more easily. If you have any dental work done, tell your dentist you are receiving this medicine. Avoid taking products that contain aspirin, acetaminophen, ibuprofen, naproxen, or ketoprofen unless instructed by your doctor. These medicines may hide a fever. Do not become pregnant while taking this medicine. Women should inform their doctor if they wish to become pregnant or think they might be pregnant. There is a potential for serious side effects to an unborn child. Talk to your health care professional or pharmacist for more information. Do not breast-feed an infant while taking this medicine. Drink fluids as directed while you are taking this medicine. This will help protect your kidneys. Call your doctor or health care professional if you get diarrhea. Do not treat yourself. What side effects may I notice from receiving this medicine? Side effects that you should report to your doctor or health care professional as soon as possible: -allergic reactions like skin rash, itching or hives, swelling of the face, lips, or tongue -signs of infection - fever or chills, cough, sore throat, pain or difficulty passing urine -signs of decreased platelets or bleeding - bruising, pinpoint red spots on the skin, black, tarry stools, nosebleeds -signs of decreased red blood cells - unusually weak or tired, fainting spells, lightheadedness -breathing  problems -changes in hearing -gout pain -low blood counts - This drug may decrease the number of white blood cells, red blood cells and platelets. You may be at increased risk for infections and bleeding. -nausea and vomiting -pain, swelling, redness or irritation at the injection site -pain, tingling, numbness in the hands or feet -problems with balance, movement -trouble passing urine or change in the amount of urine Side effects that usually do not require medical attention (report to your doctor or health care professional if they continue or are bothersome): -changes in vision -loss of appetite -metallic taste in the mouth or changes in taste This list may not describe all possible side effects. Call your doctor for medical advice about side effects. You may report side effects to FDA at 1-800-FDA-1088. Where should I keep my medicine? This drug is given in a hospital or clinic and will not be stored at home. NOTE: This sheet is a summary. It may not cover all possible information. If you have questions about this medicine, talk to your doctor, pharmacist, or health care provider.  2018 Elsevier/Gold Standard (2007-06-02 14:40:54)   Gemcitabine injection What is this medicine? GEMCITABINE (jem SIT a been) is a chemotherapy drug. This medicine is used to treat many types of cancer like breast cancer, lung cancer, pancreatic cancer, and ovarian cancer. This medicine may be used for other purposes; ask your health care provider or pharmacist if you have questions. COMMON BRAND NAME(S): Gemzar What should I tell my health care provider before I take this medicine? They need to  know if you have any of these conditions: -blood disorders -infection -kidney disease -liver disease -recent or ongoing radiation therapy -an unusual or allergic reaction to gemcitabine, other chemotherapy, other medicines, foods, dyes, or preservatives -pregnant or trying to get  pregnant -breast-feeding How should I use this medicine? This drug is given as an infusion into a vein. It is administered in a hospital or clinic by a specially trained health care professional. Talk to your pediatrician regarding the use of this medicine in children. Special care may be needed. Overdosage: If you think you have taken too much of this medicine contact a poison control center or emergency room at once. NOTE: This medicine is only for you. Do not share this medicine with others. What if I miss a dose? It is important not to miss your dose. Call your doctor or health care professional if you are unable to keep an appointment. What may interact with this medicine? -medicines to increase blood counts like filgrastim, pegfilgrastim, sargramostim -some other chemotherapy drugs like cisplatin -vaccines Talk to your doctor or health care professional before taking any of these medicines: -acetaminophen -aspirin -ibuprofen -ketoprofen -naproxen This list may not describe all possible interactions. Give your health care provider a list of all the medicines, herbs, non-prescription drugs, or dietary supplements you use. Also tell them if you smoke, drink alcohol, or use illegal drugs. Some items may interact with your medicine. What should I watch for while using this medicine? Visit your doctor for checks on your progress. This drug may make you feel generally unwell. This is not uncommon, as chemotherapy can affect healthy cells as well as cancer cells. Report any side effects. Continue your course of treatment even though you feel ill unless your doctor tells you to stop. In some cases, you may be given additional medicines to help with side effects. Follow all directions for their use. Call your doctor or health care professional for advice if you get a fever, chills or sore throat, or other symptoms of a cold or flu. Do not treat yourself. This drug decreases your body's ability to  fight infections. Try to avoid being around people who are sick. This medicine may increase your risk to bruise or bleed. Call your doctor or health care professional if you notice any unusual bleeding. Be careful brushing and flossing your teeth or using a toothpick because you may get an infection or bleed more easily. If you have any dental work done, tell your dentist you are receiving this medicine. Avoid taking products that contain aspirin, acetaminophen, ibuprofen, naproxen, or ketoprofen unless instructed by your doctor. These medicines may hide a fever. Women should inform their doctor if they wish to become pregnant or think they might be pregnant. There is a potential for serious side effects to an unborn child. Talk to your health care professional or pharmacist for more information. Do not breast-feed an infant while taking this medicine. What side effects may I notice from receiving this medicine? Side effects that you should report to your doctor or health care professional as soon as possible: -allergic reactions like skin rash, itching or hives, swelling of the face, lips, or tongue -low blood counts - this medicine may decrease the number of white blood cells, red blood cells and platelets. You may be at increased risk for infections and bleeding. -signs of infection - fever or chills, cough, sore throat, pain or difficulty passing urine -signs of decreased platelets or bleeding - bruising, pinpoint red spots  on the skin, black, tarry stools, blood in the urine -signs of decreased red blood cells - unusually weak or tired, fainting spells, lightheadedness -breathing problems -chest pain -mouth sores -nausea and vomiting -pain, swelling, redness at site where injected -pain, tingling, numbness in the hands or feet -stomach pain -swelling of ankles, feet, hands -unusual bleeding Side effects that usually do not require medical attention (report to your doctor or health care  professional if they continue or are bothersome): -constipation -diarrhea -hair loss -loss of appetite -stomach upset This list may not describe all possible side effects. Call your doctor for medical advice about side effects. You may report side effects to FDA at 1-800-FDA-1088. Where should I keep my medicine? This drug is given in a hospital or clinic and will not be stored at home. NOTE: This sheet is a summary. It may not cover all possible information. If you have questions about this medicine, talk to your doctor, pharmacist, or health care provider.  2018 Elsevier/Gold Standard (2007-07-07 18:45:54)

## 2017-07-03 ENCOUNTER — Telehealth: Payer: Self-pay | Admitting: *Deleted

## 2017-07-03 NOTE — Telephone Encounter (Signed)
Called pt, he feels well. Denied nausea, he is taking antiemetic Q6 hours. Pt reported constipation, stated he usually takes an enema after 3 days without BM. Informed him it is OK to use enema. Instructed to start Miralax daily.  Instructed pt to take antiemetic PRN only. He voiced understanding.

## 2017-07-04 ENCOUNTER — Ambulatory Visit: Payer: PPO

## 2017-07-08 ENCOUNTER — Other Ambulatory Visit: Payer: PPO

## 2017-07-08 ENCOUNTER — Other Ambulatory Visit: Payer: Self-pay

## 2017-07-08 ENCOUNTER — Ambulatory Visit: Payer: PPO | Admitting: Oncology

## 2017-07-08 ENCOUNTER — Telehealth: Payer: Self-pay

## 2017-07-08 DIAGNOSIS — C01 Malignant neoplasm of base of tongue: Secondary | ICD-10-CM

## 2017-07-08 MED ORDER — OXYCODONE-ACETAMINOPHEN 5-325 MG PO TABS: 1.0000 | ORAL_TABLET | ORAL | 0 refills | Status: DC | PRN

## 2017-07-08 NOTE — Telephone Encounter (Signed)
Spoke with pt. Advised that pt rx for Percocet can be refilled and will be available for pick-up after 1pm today.

## 2017-07-09 ENCOUNTER — Other Ambulatory Visit: Payer: PPO

## 2017-07-09 ENCOUNTER — Telehealth: Payer: Self-pay | Admitting: *Deleted

## 2017-07-09 ENCOUNTER — Ambulatory Visit: Payer: PPO | Admitting: Oncology

## 2017-07-09 ENCOUNTER — Telehealth: Payer: Self-pay | Admitting: Internal Medicine

## 2017-07-09 ENCOUNTER — Ambulatory Visit: Payer: PPO

## 2017-07-09 MED ORDER — SORBITOL 70 % PO SOLN: 30.0000 mL | Freq: Every day | ORAL | 0 refills | Status: DC | PRN

## 2017-07-09 NOTE — Telephone Encounter (Signed)
Patient undergoing cancer treatments from the cancer center, states he is also taking percocet for pain. States he has taken miralax, mag citrate, senokot, enemas and none of it really helps. Asked pt if he had called and discussed this with his oncologist. States his appt with him is next week. Encouraged pt to call oncology as they may want to adjust or change some of his medications. Pt verbalized understanding. Let him know if oncology feels he needs to see GI to call us back and we could get him in for a visit.

## 2017-07-09 NOTE — Telephone Encounter (Signed)
Pt would like to speak with you. He stated that he had colon a month ago and it is not feeling good.

## 2017-07-09 NOTE — Telephone Encounter (Signed)
Message from pt's sister reporting he has been "severely constipated for months." He has tried miralax, senokot, dulcolax and fleets enema and suppositories.  Pt reports he had small ribbon-like bowel movement 2 days ago. Still feels constipated. Reviewed with Dr. Benay Spice: Order received for Sorbitol 66mL daily. Instructions reviewed with pt's sister. Informed her he will need to take something daily while on narcotic analgesics.Teach back complete.

## 2017-07-11 NOTE — Telephone Encounter (Addendum)
Call from pt reporting he has not had relief of constipation. He has been taking the sorbitol twice daily. Instructed him to try dulcolax suppository. He has only tried glycerin suppositories int he past.  Pt stated he will try magnesium citrate again if no relief from dulcolax. Will review with provider for further instructions.

## 2017-07-13 ENCOUNTER — Other Ambulatory Visit: Payer: Self-pay | Admitting: Oncology

## 2017-07-15 ENCOUNTER — Inpatient Hospital Stay: Payer: PPO

## 2017-07-15 ENCOUNTER — Telehealth: Payer: Self-pay | Admitting: Nurse Practitioner

## 2017-07-15 ENCOUNTER — Encounter: Payer: Self-pay | Admitting: Nurse Practitioner

## 2017-07-15 ENCOUNTER — Inpatient Hospital Stay: Payer: PPO | Attending: Nurse Practitioner

## 2017-07-15 ENCOUNTER — Inpatient Hospital Stay (HOSPITAL_BASED_OUTPATIENT_CLINIC_OR_DEPARTMENT_OTHER): Payer: PPO | Admitting: Nurse Practitioner

## 2017-07-15 VITALS — BP 133/87 | HR 68 | Temp 98.5°F | Resp 18 | Ht 73.0 in | Wt 184.1 lb

## 2017-07-15 DIAGNOSIS — C221 Intrahepatic bile duct carcinoma: Secondary | ICD-10-CM | POA: Diagnosis not present

## 2017-07-15 DIAGNOSIS — Z5111 Encounter for antineoplastic chemotherapy: Secondary | ICD-10-CM | POA: Diagnosis not present

## 2017-07-15 DIAGNOSIS — Z85810 Personal history of malignant neoplasm of tongue: Secondary | ICD-10-CM | POA: Diagnosis not present

## 2017-07-15 DIAGNOSIS — I739 Peripheral vascular disease, unspecified: Secondary | ICD-10-CM | POA: Diagnosis not present

## 2017-07-15 DIAGNOSIS — E039 Hypothyroidism, unspecified: Secondary | ICD-10-CM

## 2017-07-15 DIAGNOSIS — C01 Malignant neoplasm of base of tongue: Secondary | ICD-10-CM

## 2017-07-15 DIAGNOSIS — G4700 Insomnia, unspecified: Secondary | ICD-10-CM | POA: Diagnosis not present

## 2017-07-15 DIAGNOSIS — J45909 Unspecified asthma, uncomplicated: Secondary | ICD-10-CM | POA: Insufficient documentation

## 2017-07-15 DIAGNOSIS — E785 Hyperlipidemia, unspecified: Secondary | ICD-10-CM | POA: Insufficient documentation

## 2017-07-15 DIAGNOSIS — I10 Essential (primary) hypertension: Secondary | ICD-10-CM | POA: Diagnosis not present

## 2017-07-15 DIAGNOSIS — R634 Abnormal weight loss: Secondary | ICD-10-CM

## 2017-07-15 LAB — CBC WITH DIFFERENTIAL (CANCER CENTER ONLY)
Basophils Absolute: 0.1 10*3/uL (ref 0.0–0.1)
Basophils Relative: 1 %
EOS PCT: 7 %
Eosinophils Absolute: 0.3 10*3/uL (ref 0.0–0.5)
HCT: 31.4 % — ABNORMAL LOW (ref 38.4–49.9)
Hemoglobin: 10.6 g/dL — ABNORMAL LOW (ref 13.0–17.1)
LYMPHS ABS: 0.7 10*3/uL — AB (ref 0.9–3.3)
Lymphocytes Relative: 15 %
MCH: 29.8 pg (ref 27.2–33.4)
MCHC: 33.7 g/dL (ref 32.0–36.0)
MCV: 88.3 fL (ref 79.3–98.0)
MONO ABS: 0.4 10*3/uL (ref 0.1–0.9)
Monocytes Relative: 9 %
Neutro Abs: 3.1 10*3/uL (ref 1.5–6.5)
Neutrophils Relative %: 68 %
PLATELETS: 196 10*3/uL (ref 140–400)
RBC: 3.56 MIL/uL — ABNORMAL LOW (ref 4.20–5.82)
RDW: 14.4 % (ref 11.0–14.6)
WBC Count: 4.6 10*3/uL (ref 4.0–10.3)

## 2017-07-15 LAB — MAGNESIUM: Magnesium: 1.9 mg/dL (ref 1.7–2.4)

## 2017-07-15 LAB — CMP (CANCER CENTER ONLY)
ALBUMIN: 3.8 g/dL (ref 3.5–5.0)
ALT: 20 U/L (ref 0–55)
AST: 22 U/L (ref 5–34)
Alkaline Phosphatase: 97 U/L (ref 40–150)
Anion gap: 3 (ref 3–11)
BILIRUBIN TOTAL: 0.4 mg/dL (ref 0.2–1.2)
BUN: 11 mg/dL (ref 7–26)
CHLORIDE: 100 mmol/L (ref 98–109)
CO2: 30 mmol/L — ABNORMAL HIGH (ref 22–29)
Calcium: 9.8 mg/dL (ref 8.4–10.4)
Creatinine: 0.82 mg/dL (ref 0.70–1.30)
GFR, Est AFR Am: >60 mL/min (ref >60)
GFR, Estimated: >60 mL/min (ref >60)
GLUCOSE: 96 mg/dL (ref 70–140)
POTASSIUM: 4.4 mmol/L (ref 3.5–5.1)
Sodium: 133 mmol/L — ABNORMAL LOW (ref 136–145)
TOTAL PROTEIN: 7.4 g/dL (ref 6.4–8.3)

## 2017-07-15 MED ORDER — HEPARIN SOD (PORK) LOCK FLUSH 100 UNIT/ML IV SOLN
500.0000 [IU] | Freq: Once | INTRAVENOUS | Status: AC | PRN
  Administered 2017-07-15: 500 [IU]
  Filled 2017-07-15: qty 5

## 2017-07-15 MED ORDER — PALONOSETRON HCL INJECTION 0.25 MG/5ML
INTRAVENOUS | Status: AC
Start: 2017-07-15 — End: ?
  Filled 2017-07-15: qty 5

## 2017-07-15 MED ORDER — SODIUM CHLORIDE 0.9 % IJ SOLN
10.0000 mL | Freq: Once | INTRAMUSCULAR | Status: AC
  Filled 2017-07-15: qty 10

## 2017-07-15 MED ORDER — POTASSIUM CHLORIDE 2 MEQ/ML IV SOLN
Freq: Once | INTRAVENOUS | Status: AC
  Administered 2017-07-15: 12:00:00 via INTRAVENOUS
  Filled 2017-07-15: qty 10

## 2017-07-15 MED ORDER — PALONOSETRON HCL INJECTION 0.25 MG/5ML
0.2500 mg | Freq: Once | INTRAVENOUS | Status: AC
  Administered 2017-07-15: 0.25 mg via INTRAVENOUS

## 2017-07-15 MED ORDER — SODIUM CHLORIDE 0.9% FLUSH
10.0000 mL | INTRAVENOUS | Status: DC | PRN
  Administered 2017-07-15: 10 mL
  Filled 2017-07-15: qty 10

## 2017-07-15 MED ORDER — SODIUM CHLORIDE 0.9 % IV SOLN
Freq: Once | INTRAVENOUS | Status: AC
  Administered 2017-07-15: 14:00:00 via INTRAVENOUS
  Filled 2017-07-15: qty 5

## 2017-07-15 MED ORDER — SODIUM CHLORIDE 0.9 % IV SOLN
25.0000 mg/m2 | Freq: Once | INTRAVENOUS | Status: AC
  Administered 2017-07-15: 54 mg via INTRAVENOUS
  Filled 2017-07-15: qty 54

## 2017-07-15 MED ORDER — SODIUM CHLORIDE 0.9 % IV SOLN
1000.0000 mg/m2 | Freq: Once | INTRAVENOUS | Status: AC
  Administered 2017-07-15: 2166 mg via INTRAVENOUS
  Filled 2017-07-15: qty 56.97

## 2017-07-15 MED ORDER — SODIUM CHLORIDE 0.9 % IV SOLN
Freq: Once | INTRAVENOUS | Status: AC
Start: 2017-07-15 — End: 2017-07-15
  Administered 2017-07-15: 12:00:00 via INTRAVENOUS

## 2017-07-15 NOTE — Patient Instructions (Signed)
[facility] Discharge Instructions for Patients Receiving Chemotherapy  Today you received the following chemotherapy agents Cisplatin and Gemzar  To help prevent nausea and vomiting after your treatment, we encourage you to take your nausea medication as directed.    If you develop nausea and vomiting that is not controlled by your nausea medication, call the clinic.   BELOW ARE SYMPTOMS THAT SHOULD BE REPORTED IMMEDIATELY:  *FEVER GREATER THAN 100.5 F  *CHILLS WITH OR WITHOUT FEVER  NAUSEA AND VOMITING THAT IS NOT CONTROLLED WITH YOUR NAUSEA MEDICATION  *UNUSUAL SHORTNESS OF BREATH  *UNUSUAL BRUISING OR BLEEDING  TENDERNESS IN MOUTH AND THROAT WITH OR WITHOUT PRESENCE OF ULCERS  *URINARY PROBLEMS  *BOWEL PROBLEMS  UNUSUAL RASH Items with * indicate a potential emergency and should be followed up as soon as possible.  Feel free to call the clinic should you have any questions or concerns. The clinic phone number is [phone].  Please show the CHEMO ALERT CARD at check-in to the Emergency Department and triage nurse.   

## 2017-07-15 NOTE — Progress Notes (Addendum)
  [facility] OFFICE PROGRESS NOTE   Diagnosis: Cholangiocarcinoma  INTERVAL HISTORY:   Michael Mosley returns for follow-up.  He completed cycle 1 gemcitabine/cisplatin 07/01/2017.  He denies nausea/vomiting.  No mouth sores.  Bowels now moving regularly.  He continues a laxative.  No fever or rash following treatment.  Thinks abdominal pain is some better.  We discussed the weight loss over the past month.  His appetite has improved.  He denies bleeding.  Objective:  Vital signs in last 24 hours:  Blood pressure 133/87, pulse 68, temperature 98.5 F (36.9 C), temperature source Oral, resp. rate 18, height '6\' 1"'$  (1.854 m), weight 184 lb 1.6 oz (83.5 kg), SpO2 100 %.    HEENT: No thrush or ulcers. Resp: Lungs clear bilaterally. Cardio: Regular rate and rhythm. GI: Abdomen soft and nontender.  No hepatomegaly. Vascular: No leg edema.  Calves soft and nontender. Port-A-Cath without erythema.  Lab Results:  Lab Results  Component Value Date   WBC 4.6 07/15/2017   HGB 10.6 (L) 07/15/2017   HCT 31.4 (L) 07/15/2017   MCV 88.3 07/15/2017   PLT 196 07/15/2017   NEUTROABS 3.1 07/15/2017    Imaging:  No results found.  Medications: I have reviewed the patient's current medications.  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   CT abdomen/pelvis on 04/21/2017-7 cm mass lesion in the lateral segment of the left liver associated with smaller lesions in the anterior right liver and medial lobe left liver. This was associated with necrotic lymphadenopathy in the gastrohepatic and hepatoduodenal ligaments.  04/25/2017 status post upper endoscopy and colonoscopy.   On the upper endoscopy he was found to have 1 moderate benign-appearing intrinsic stenosis at 40 cm from the incisors. This was dilated. The exam of  the esophagus was otherwise normal. The entire examined stomach and duodenum also normal.   On the colonoscopy he was found to have a 5 mm polyp in the cecum which was removed. Pathology showed a serrated polyp with features of a sessile serrated polyp; negative for cytologic dysplasia. He was noted to have many small and large mouth diverticula in the sigmoid colon and descending colon. Terminal ileum appeared normal. Internal hemorrhoids were found as well.   04/28/2017 status post biopsy of a liver lesion. Pathology showed adenocarcinoma positive for cytokeratin 7 (strong), CDX-2 (weak) and focal TTF-1. Cytokeratin 20 essentially negative.  CT chest 05/12/2017-no evidence of metastatic disease or a primary chest tumor  Foundation 1-microsatellite stable; tumor mutational burden 6 Muts/Mb; LYYT03 loss  Cycle 1 gemcitabine/cisplatin 07/01/2017  Cycle 2 gemcitabine/cisplatin 07/15/2017 2. Weight loss 3. 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 Alen Blew) 4. Asthma 5. Hypertension 6. Hyperlipidemia 7. Hypothyroid 8. Peripheral vascular disease     Disposition: Michael Mosley appears stable.  He has completed 1 cycle of gemcitabine/cisplatin.  Overall he seems to have tolerated the first chemotherapy well.  Plan to proceed with cycle 2 today as scheduled pending chemistry panel results.  He will return for lab, follow-up and cycle 3 in 2 weeks.  He will contact the office in the interim with any problems.  CBC from today reviewed.  Labs adequate for treatment.  Chemistry panel/magnesium pending.  Ned Card ANP/GNP-BC   07/15/2017  10:47 AM

## 2017-07-15 NOTE — Telephone Encounter (Signed)
Scheduled appt per 5/7 los - unable to schedule treatment for 5/21 due to capped day - logged - will contact patient when appt scheduled.

## 2017-07-16 ENCOUNTER — Telehealth: Payer: Self-pay | Admitting: Oncology

## 2017-07-16 NOTE — Telephone Encounter (Signed)
Scheduled appt per 5/7 los - left message with appt date and time and sent reminder letter in the mail.

## 2017-07-27 ENCOUNTER — Other Ambulatory Visit: Payer: Self-pay | Admitting: Oncology

## 2017-07-29 ENCOUNTER — Inpatient Hospital Stay: Payer: PPO

## 2017-07-29 ENCOUNTER — Inpatient Hospital Stay (HOSPITAL_BASED_OUTPATIENT_CLINIC_OR_DEPARTMENT_OTHER): Payer: PPO | Admitting: Oncology

## 2017-07-29 ENCOUNTER — Telehealth: Payer: Self-pay | Admitting: Oncology

## 2017-07-29 VITALS — BP 122/78 | HR 70 | Temp 97.7°F | Resp 18 | Ht 73.0 in | Wt 186.1 lb

## 2017-07-29 DIAGNOSIS — J45909 Unspecified asthma, uncomplicated: Secondary | ICD-10-CM

## 2017-07-29 DIAGNOSIS — Z95828 Presence of other vascular implants and grafts: Secondary | ICD-10-CM

## 2017-07-29 DIAGNOSIS — E039 Hypothyroidism, unspecified: Secondary | ICD-10-CM | POA: Diagnosis not present

## 2017-07-29 DIAGNOSIS — I739 Peripheral vascular disease, unspecified: Secondary | ICD-10-CM | POA: Diagnosis not present

## 2017-07-29 DIAGNOSIS — C221 Intrahepatic bile duct carcinoma: Secondary | ICD-10-CM | POA: Diagnosis not present

## 2017-07-29 DIAGNOSIS — I10 Essential (primary) hypertension: Secondary | ICD-10-CM

## 2017-07-29 DIAGNOSIS — G4700 Insomnia, unspecified: Secondary | ICD-10-CM | POA: Diagnosis not present

## 2017-07-29 DIAGNOSIS — Z85810 Personal history of malignant neoplasm of tongue: Secondary | ICD-10-CM | POA: Diagnosis not present

## 2017-07-29 DIAGNOSIS — R634 Abnormal weight loss: Secondary | ICD-10-CM | POA: Diagnosis not present

## 2017-07-29 DIAGNOSIS — Z5111 Encounter for antineoplastic chemotherapy: Secondary | ICD-10-CM | POA: Diagnosis not present

## 2017-07-29 DIAGNOSIS — E785 Hyperlipidemia, unspecified: Secondary | ICD-10-CM | POA: Diagnosis not present

## 2017-07-29 LAB — CMP (CANCER CENTER ONLY)
ALBUMIN: 3.8 g/dL (ref 3.5–5.0)
ALT: 18 U/L (ref 0–55)
AST: 20 U/L (ref 5–34)
Alkaline Phosphatase: 85 U/L (ref 40–150)
Anion gap: 8 (ref 3–11)
BUN: 18 mg/dL (ref 7–26)
CHLORIDE: 100 mmol/L (ref 98–109)
CO2: 27 mmol/L (ref 22–29)
Calcium: 9.2 mg/dL (ref 8.4–10.4)
Creatinine: 0.88 mg/dL (ref 0.70–1.30)
GFR, Est AFR Am: >60 mL/min (ref >60)
GFR, Estimated: >60 mL/min (ref >60)
GLUCOSE: 107 mg/dL (ref 70–140)
Potassium: 4.2 mmol/L (ref 3.5–5.1)
Sodium: 135 mmol/L — ABNORMAL LOW (ref 136–145)
Total Bilirubin: 0.3 mg/dL (ref 0.2–1.2)
Total Protein: 7 g/dL (ref 6.4–8.3)

## 2017-07-29 LAB — CBC WITH DIFFERENTIAL (CANCER CENTER ONLY)
Basophils Absolute: 0.1 10*3/uL (ref 0.0–0.1)
Basophils Relative: 1 %
EOS PCT: 3 %
Eosinophils Absolute: 0.2 10*3/uL (ref 0.0–0.5)
HCT: 29.4 % — ABNORMAL LOW (ref 38.4–49.9)
Hemoglobin: 10 g/dL — ABNORMAL LOW (ref 13.0–17.1)
LYMPHS ABS: 0.9 10*3/uL (ref 0.9–3.3)
LYMPHS PCT: 12 %
MCH: 30.2 pg (ref 27.2–33.4)
MCHC: 33.9 g/dL (ref 32.0–36.0)
MCV: 89.1 fL (ref 79.3–98.0)
MONO ABS: 0.8 10*3/uL (ref 0.1–0.9)
Monocytes Relative: 11 %
Neutro Abs: 5.3 10*3/uL (ref 1.5–6.5)
Neutrophils Relative %: 73 %
PLATELETS: 168 10*3/uL (ref 140–400)
RBC: 3.3 MIL/uL — ABNORMAL LOW (ref 4.20–5.82)
RDW: 15.2 % — AB (ref 11.0–14.6)
WBC Count: 7.3 10*3/uL (ref 4.0–10.3)

## 2017-07-29 LAB — MAGNESIUM: MAGNESIUM: 1.9 mg/dL (ref 1.7–2.4)

## 2017-07-29 MED ORDER — SODIUM CHLORIDE 0.9 % IV SOLN
Freq: Once | INTRAVENOUS | Status: AC
  Administered 2017-07-29: 12:00:00 via INTRAVENOUS
  Filled 2017-07-29: qty 5

## 2017-07-29 MED ORDER — POTASSIUM CHLORIDE 2 MEQ/ML IV SOLN
Freq: Once | INTRAVENOUS | Status: AC
  Administered 2017-07-29: 10:00:00 via INTRAVENOUS
  Filled 2017-07-29: qty 10

## 2017-07-29 MED ORDER — HEPARIN SOD (PORK) LOCK FLUSH 100 UNIT/ML IV SOLN
500.0000 [IU] | Freq: Once | INTRAVENOUS | Status: AC | PRN
  Administered 2017-07-29: 500 [IU]
  Filled 2017-07-29: qty 5

## 2017-07-29 MED ORDER — SODIUM CHLORIDE 0.9% FLUSH
10.0000 mL | INTRAVENOUS | Status: DC | PRN
  Administered 2017-07-29: 10 mL
  Filled 2017-07-29: qty 10

## 2017-07-29 MED ORDER — SODIUM CHLORIDE 0.9 % IV SOLN
25.0000 mg/m2 | Freq: Once | INTRAVENOUS | Status: AC
  Administered 2017-07-29: 54 mg via INTRAVENOUS
  Filled 2017-07-29: qty 54

## 2017-07-29 MED ORDER — SODIUM CHLORIDE 0.9 % IV SOLN
Freq: Once | INTRAVENOUS | Status: AC
  Administered 2017-07-29: 10:00:00 via INTRAVENOUS

## 2017-07-29 MED ORDER — SODIUM CHLORIDE 0.9% FLUSH
10.0000 mL | INTRAVENOUS | Status: DC | PRN
Start: 2017-07-29 — End: 2017-07-29
  Administered 2017-07-29: 10 mL
  Filled 2017-07-29: qty 10

## 2017-07-29 MED ORDER — PALONOSETRON HCL INJECTION 0.25 MG/5ML
INTRAVENOUS | Status: AC
  Filled 2017-07-29: qty 5

## 2017-07-29 MED ORDER — PALONOSETRON HCL INJECTION 0.25 MG/5ML
0.2500 mg | Freq: Once | INTRAVENOUS | Status: AC
  Administered 2017-07-29: 0.25 mg via INTRAVENOUS

## 2017-07-29 MED ORDER — GEMCITABINE HCL CHEMO INJECTION 1 GM/26.3ML
1000.0000 mg/m2 | Freq: Once | INTRAVENOUS | Status: AC
  Administered 2017-07-29: 2166 mg via INTRAVENOUS
  Filled 2017-07-29: qty 56.97

## 2017-07-29 NOTE — Patient Instructions (Signed)
[facility] Discharge Instructions for Patients Receiving Chemotherapy  Today you received the following chemotherapy agents :  Cisplatin, Gemcitabine.  To help prevent nausea and vomiting after your treatment, we encourage you to take your nausea medication as prescribed.   If you develop nausea and vomiting that is not controlled by your nausea medication, call the clinic.   BELOW ARE SYMPTOMS THAT SHOULD BE REPORTED IMMEDIATELY:  *FEVER GREATER THAN 100.5 F  *CHILLS WITH OR WITHOUT FEVER  NAUSEA AND VOMITING THAT IS NOT CONTROLLED WITH YOUR NAUSEA MEDICATION  *UNUSUAL SHORTNESS OF BREATH  *UNUSUAL BRUISING OR BLEEDING  TENDERNESS IN MOUTH AND THROAT WITH OR WITHOUT PRESENCE OF ULCERS  *URINARY PROBLEMS  *BOWEL PROBLEMS  UNUSUAL RASH Items with * indicate a potential emergency and should be followed up as soon as possible.  Feel free to call the clinic should you have any questions or concerns. The clinic phone number is [phone].  Please show the CHEMO ALERT CARD at check-in to the Emergency Department and triage nurse.   

## 2017-07-29 NOTE — Telephone Encounter (Signed)
Appt already scheduled per 5/21 los - no additional apts added.

## 2017-07-29 NOTE — Progress Notes (Signed)
[place] OFFICE PROGRESS NOTE   Diagnosis: Cholangiocarcinoma  INTERVAL HISTORY:   Michael Mosley returns for a scheduled visit.  He completed cycle 2 gemcitabine/cisplatin On 07/15/2017.  No fever, rash, or peripheral numbness.  He has noted discomfort in the left hip with ambulation and bilateral foot pain since discontinuing turmeric.  The abdomen and back pain have resolved.  He is no longer taking pain medication.  Good appetite.  His chief complaint today is insomnia.  This has not been relieved with alprazolam. Objective:  Vital signs in last 24 hours:  Blood pressure 122/78, pulse 70, temperature 97.7 F (36.5 C), temperature source Oral, resp. rate 18, height '6\' 1"'  (1.854 m), weight 186 lb 1.6 oz (84.4 kg), SpO2 99 %.    HEENT: No thrush or ulcers Resp: Lungs clear bilaterally Cardio: Regular rate and rhythm GI: No hepatomegaly, no mass, nontender Vascular: No leg edema    Portacath/PICC-without erythema  Lab Results:  Lab Results  Component Value Date   WBC 7.3 07/29/2017   HGB 10.0 (L) 07/29/2017   HCT 29.4 (L) 07/29/2017   MCV 89.1 07/29/2017   PLT 168 07/29/2017   NEUTROABS 5.3 07/29/2017    CMP     Component Value Date/Time   NA 133 (L) 07/15/2017 0907   NA 140 03/03/2017 1000   K 4.4 07/15/2017 0907   CL 100 07/15/2017 0907   CO2 30 (H) 07/15/2017 0907   GLUCOSE 96 07/15/2017 0907   BUN 11 07/15/2017 0907   BUN 14 03/03/2017 1000   CREATININE 0.82 07/15/2017 0907   CALCIUM 9.8 07/15/2017 0907   PROT 7.4 07/15/2017 0907   PROT 7.9 03/03/2017 1000   ALBUMIN 3.8 07/15/2017 0907   ALBUMIN 4.7 03/03/2017 1000   AST 22 07/15/2017 0907   ALT 20 07/15/2017 0907   ALKPHOS 97 07/15/2017 0907   BILITOT 0.4 07/15/2017 0907   GFRNONAA >60 07/15/2017 0907   GFRAA >60 07/15/2017 0907     Medications: I have reviewed the patient's current medications.   Assessment/Plan: 1. Adenocarcinoma involving the liver 04/28/2017  CT cardiac  calcium scoringexamon 03/13/2017-shotty mediastinal lymph nodes and prominent periportal and gastrohepatic lymph nodes.   CT abdomen/pelvis on 04/21/2017-7 cm mass lesion in the lateral segment of the left liver associated with smaller lesions in the anterior right liver and medial lobe left liver. This was associated with necrotic lymphadenopathy in the gastrohepatic and hepatoduodenal ligaments.  04/25/2017 status post upper endoscopy and colonoscopy.   On the upper endoscopy he was found to have 1 moderate benign-appearing intrinsic stenosis at 40 cm from the incisors. This was dilated. The exam of the esophagus was otherwise normal. The entire examined stomach and duodenum also normal.   On the colonoscopy he was found to have a 5 mm polyp in the cecum which was removed. Pathology showed a serrated polyp with features of a sessile serrated polyp; negative for cytologic dysplasia. He was noted to have many small and large mouth diverticula in the sigmoid colon and descending colon. Terminal ileum appeared normal. Internal hemorrhoids were found as well.   04/28/2017 status post biopsy of a liver lesion. Pathology showed adenocarcinoma positive for cytokeratin 7 (strong), CDX-2 (weak) and focal TTF-1. Cytokeratin 20 essentially negative.  CT chest 05/12/2017-no evidence of metastatic disease or a primary chest tumor  Foundation1-microsatellite stable; tumor mutational burden 6 Muts/Mb; BDZH29 loss  Cycle 1 gemcitabine/cisplatin 07/01/2017  Cycle 2 gemcitabine/cisplatin 07/15/2017  Cycle 3 gemcitabine/cisplatin 07/29/2017 2. Weight loss-improved 3.  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 Alen Blew) 4. Asthma 5. Hypertension 6. Hyperlipidemia 7. Hypothyroid 8. Peripheral vascular disease    Disposition: Michael Mosley has completed 2 cycles of gemcitabine/cisplatin.  He has tolerated  chemotherapy well.  His pain and performance status are improved.  He will complete cycle 3 today.  We will check a CA 19-9 when he returns for chemotherapy in 2 weeks.  He will complete 6 cycles of chemotherapy prior to a restaging CT evaluation.  Mr. Rathje will begin a trial of Benadryl for insomnia.  Betsy Coder, MD  07/29/2017  8:55 AM

## 2017-08-10 ENCOUNTER — Other Ambulatory Visit: Payer: Self-pay | Admitting: Oncology

## 2017-08-11 ENCOUNTER — Other Ambulatory Visit: Payer: Self-pay | Admitting: *Deleted

## 2017-08-12 ENCOUNTER — Encounter: Payer: Self-pay | Admitting: Nurse Practitioner

## 2017-08-12 ENCOUNTER — Telehealth: Payer: Self-pay | Admitting: Nurse Practitioner

## 2017-08-12 ENCOUNTER — Inpatient Hospital Stay: Payer: PPO

## 2017-08-12 ENCOUNTER — Inpatient Hospital Stay (HOSPITAL_BASED_OUTPATIENT_CLINIC_OR_DEPARTMENT_OTHER): Payer: PPO | Admitting: Nurse Practitioner

## 2017-08-12 ENCOUNTER — Inpatient Hospital Stay: Payer: PPO | Attending: Oncology

## 2017-08-12 VITALS — BP 108/76 | HR 64 | Temp 98.2°F | Resp 17 | Ht 73.0 in | Wt 195.8 lb

## 2017-08-12 DIAGNOSIS — R200 Anesthesia of skin: Secondary | ICD-10-CM

## 2017-08-12 DIAGNOSIS — Z95828 Presence of other vascular implants and grafts: Secondary | ICD-10-CM

## 2017-08-12 DIAGNOSIS — E785 Hyperlipidemia, unspecified: Secondary | ICD-10-CM

## 2017-08-12 DIAGNOSIS — I10 Essential (primary) hypertension: Secondary | ICD-10-CM | POA: Diagnosis not present

## 2017-08-12 DIAGNOSIS — R634 Abnormal weight loss: Secondary | ICD-10-CM | POA: Insufficient documentation

## 2017-08-12 DIAGNOSIS — E039 Hypothyroidism, unspecified: Secondary | ICD-10-CM | POA: Diagnosis not present

## 2017-08-12 DIAGNOSIS — J45909 Unspecified asthma, uncomplicated: Secondary | ICD-10-CM | POA: Diagnosis not present

## 2017-08-12 DIAGNOSIS — C221 Intrahepatic bile duct carcinoma: Secondary | ICD-10-CM

## 2017-08-12 DIAGNOSIS — Z5111 Encounter for antineoplastic chemotherapy: Secondary | ICD-10-CM | POA: Insufficient documentation

## 2017-08-12 DIAGNOSIS — I739 Peripheral vascular disease, unspecified: Secondary | ICD-10-CM

## 2017-08-12 DIAGNOSIS — Z85810 Personal history of malignant neoplasm of tongue: Secondary | ICD-10-CM | POA: Diagnosis not present

## 2017-08-12 LAB — CBC WITH DIFFERENTIAL (CANCER CENTER ONLY)
Basophils Absolute: 0.1 10*3/uL (ref 0.0–0.1)
Basophils Relative: 1 %
Eosinophils Absolute: 0.3 10*3/uL (ref 0.0–0.5)
Eosinophils Relative: 7 %
HEMATOCRIT: 28.2 % — AB (ref 38.4–49.9)
HEMOGLOBIN: 9.4 g/dL — AB (ref 13.0–17.1)
LYMPHS ABS: 0.7 10*3/uL — AB (ref 0.9–3.3)
LYMPHS PCT: 15 %
MCH: 30.4 pg (ref 27.2–33.4)
MCHC: 33.3 g/dL (ref 32.0–36.0)
MCV: 91.5 fL (ref 79.3–98.0)
MONOS PCT: 11 %
Monocytes Absolute: 0.5 10*3/uL (ref 0.1–0.9)
NEUTROS ABS: 3.1 10*3/uL (ref 1.5–6.5)
NEUTROS PCT: 66 %
Platelet Count: 140 10*3/uL (ref 140–400)
RBC: 3.08 MIL/uL — AB (ref 4.20–5.82)
RDW: 18.3 % — ABNORMAL HIGH (ref 11.0–14.6)
WBC: 4.6 10*3/uL (ref 4.0–10.3)

## 2017-08-12 LAB — CMP (CANCER CENTER ONLY)
ALT: 10 U/L (ref 0–55)
ANION GAP: 8 (ref 3–11)
AST: 18 U/L (ref 5–34)
Albumin: 3.5 g/dL (ref 3.5–5.0)
Alkaline Phosphatase: 76 U/L (ref 40–150)
BUN: 11 mg/dL (ref 7–26)
CHLORIDE: 105 mmol/L (ref 98–109)
CO2: 27 mmol/L (ref 22–29)
CREATININE: 0.87 mg/dL (ref 0.70–1.30)
Calcium: 8.9 mg/dL (ref 8.4–10.4)
Glucose, Bld: 113 mg/dL (ref 70–140)
POTASSIUM: 3.8 mmol/L (ref 3.5–5.1)
SODIUM: 140 mmol/L (ref 136–145)
Total Bilirubin: 0.3 mg/dL (ref 0.2–1.2)
Total Protein: 6.5 g/dL (ref 6.4–8.3)

## 2017-08-12 LAB — MAGNESIUM: Magnesium: 1.9 mg/dL (ref 1.7–2.4)

## 2017-08-12 MED ORDER — SODIUM CHLORIDE 0.9% FLUSH
10.0000 mL | INTRAVENOUS | Status: DC | PRN
  Administered 2017-08-12: 10 mL
  Filled 2017-08-12: qty 10

## 2017-08-12 MED ORDER — SODIUM CHLORIDE 0.9 % IV SOLN
1000.0000 mg/m2 | Freq: Once | INTRAVENOUS | Status: AC
  Administered 2017-08-12: 2166 mg via INTRAVENOUS
  Filled 2017-08-12: qty 56.97

## 2017-08-12 MED ORDER — HEPARIN SOD (PORK) LOCK FLUSH 100 UNIT/ML IV SOLN
500.0000 [IU] | Freq: Once | INTRAVENOUS | Status: AC | PRN
Start: 2017-08-12 — End: 2017-08-12
  Administered 2017-08-12: 500 [IU]
  Filled 2017-08-12: qty 5

## 2017-08-12 MED ORDER — SODIUM CHLORIDE 0.9 % IV SOLN
Freq: Once | INTRAVENOUS | Status: AC
  Administered 2017-08-12: 11:00:00 via INTRAVENOUS

## 2017-08-12 MED ORDER — SODIUM CHLORIDE 0.9 % IV SOLN
25.0000 mg/m2 | Freq: Once | INTRAVENOUS | Status: AC
  Administered 2017-08-12: 54 mg via INTRAVENOUS
  Filled 2017-08-12: qty 54

## 2017-08-12 MED ORDER — SODIUM CHLORIDE 0.9 % IV SOLN
Freq: Once | INTRAVENOUS | Status: AC
  Administered 2017-08-12: 12:00:00 via INTRAVENOUS
  Filled 2017-08-12: qty 5

## 2017-08-12 MED ORDER — PALONOSETRON HCL INJECTION 0.25 MG/5ML
0.2500 mg | Freq: Once | INTRAVENOUS | Status: AC
  Administered 2017-08-12: 0.25 mg via INTRAVENOUS

## 2017-08-12 MED ORDER — POTASSIUM CHLORIDE 2 MEQ/ML IV SOLN
Freq: Once | INTRAVENOUS | Status: AC
  Administered 2017-08-12: 11:00:00 via INTRAVENOUS
  Filled 2017-08-12: qty 10

## 2017-08-12 MED ORDER — PALONOSETRON HCL INJECTION 0.25 MG/5ML
INTRAVENOUS | Status: AC
  Filled 2017-08-12: qty 5

## 2017-08-12 NOTE — Patient Instructions (Signed)
[facility] Discharge Instructions for Patients Receiving Chemotherapy  Today you received the following chemotherapy agents :  Gemcitabine,  Cisplatin.  To help prevent nausea and vomiting after your treatment, we encourage you to take your nausea medication as prescribed.   If you develop nausea and vomiting that is not controlled by your nausea medication, call the clinic.   BELOW ARE SYMPTOMS THAT SHOULD BE REPORTED IMMEDIATELY:  *FEVER GREATER THAN 100.5 F  *CHILLS WITH OR WITHOUT FEVER  NAUSEA AND VOMITING THAT IS NOT CONTROLLED WITH YOUR NAUSEA MEDICATION  *UNUSUAL SHORTNESS OF BREATH  *UNUSUAL BRUISING OR BLEEDING  TENDERNESS IN MOUTH AND THROAT WITH OR WITHOUT PRESENCE OF ULCERS  *URINARY PROBLEMS  *BOWEL PROBLEMS  UNUSUAL RASH Items with * indicate a potential emergency and should be followed up as soon as possible.  Feel free to call the clinic should you have any questions or concerns. The clinic phone number is [phone].  Please show the CHEMO ALERT CARD at check-in to the Emergency Department and triage nurse.   

## 2017-08-12 NOTE — Progress Notes (Signed)
[place] OFFICE PROGRESS NOTE   Diagnosis: Cholangiocarcinoma.  INTERVAL HISTORY:   Michael Mosley returns as scheduled.  He completed cycle 3 gemcitabine/cisplatin 07/29/2017.  He denies nausea/vomiting.  No mouth sores.  No diarrhea.  He notes mild intermittent numbness involving the left foot and lower leg.  He no longer has pain.  He reports he has not taken pain medication in approximately 3 weeks.  His energy level is better.  He has been able to play golf.  He has a good appetite and is gaining weight.  Objective:  Vital signs in last 24 hours:  Blood pressure 108/76, pulse 64, temperature 98.2 F (36.8 C), temperature source Oral, resp. rate 17, height '6\' 1"'  (1.854 m), weight 195 lb 12.8 oz (88.8 kg), SpO2 99 %.    HEENT: No thrush or ulcers. Resp: Lungs clear bilaterally. Cardio: Regular rate and rhythm. GI: Abdomen soft and nontender.  No hepatomegaly. Vascular: No leg edema. Neuro: Alert and oriented.  Port-A-Cath without erythema.  Lab Results:  Lab Results  Component Value Date   WBC 7.3 07/29/2017   HGB 10.0 (L) 07/29/2017   HCT 29.4 (L) 07/29/2017   MCV 89.1 07/29/2017   PLT 168 07/29/2017   NEUTROABS 5.3 07/29/2017    Imaging:  No results found.  Medications: I have reviewed the patient's current medications.  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   CT abdomen/pelvis on 04/21/2017-7 cm mass lesion in the lateral segment of the left liver associated with smaller lesions in the anterior right liver and medial lobe left liver. This was associated with necrotic lymphadenopathy in the gastrohepatic and hepatoduodenal ligaments.  04/25/2017 status post upper endoscopy and colonoscopy.   On the upper endoscopy he was found to have 1 moderate benign-appearing intrinsic stenosis  at 40 cm from the incisors. This was dilated. The exam of the esophagus was otherwise normal. The entire examined stomach and duodenum also normal.   On the colonoscopy he was found to have a 5 mm polyp in the cecum which was removed. Pathology showed a serrated polyp with features of a sessile serrated polyp; negative for cytologic dysplasia. He was noted to have many small and large mouth diverticula in the sigmoid colon and descending colon. Terminal ileum appeared normal. Internal hemorrhoids were found as well.   04/28/2017 status post biopsy of a liver lesion. Pathology showed adenocarcinoma positive for cytokeratin 7 (strong), CDX-2 (weak) and focal TTF-1. Cytokeratin 20 essentially negative.  CT chest 05/12/2017-no evidence of metastatic disease or a primary chest tumor  Foundation1-microsatellite stable; tumor mutational burden 6 Muts/Mb; PXTG62 loss  Cycle 1 gemcitabine/cisplatin 07/01/2017  Cycle 2 gemcitabine/cisplatin 07/15/2017  Cycle 3 gemcitabine/cisplatin 07/29/2017  Cycle 4 gemcitabine/cisplatin 08/12/2017 2. Weight loss-improved 3. 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 Alen Blew) 4. Asthma 5. Hypertension 6. Hyperlipidemia 7. Hypothyroid 8. Peripheral vascular disease    Disposition: Michael Mosley appears well.  He has completed 3 cycles of gemcitabine/cisplatin.  He is tolerating the chemotherapy well.  Pain and performance status both significantly improved.  Plan to proceed with cycle 4 today as scheduled.  We will follow-up on the CA-19-9 from today.  The plan is to complete 6 cycles of chemotherapy prior to a restaging CT evaluation.  He will return for lab, follow-up and cycle 5 gemcitabine/cisplatin in 2 weeks.  He  will contact the office in the interim with any problems.    Ned Card ANP/GNP-BC   08/12/2017  9:59 AM

## 2017-08-12 NOTE — Telephone Encounter (Signed)
Appointments scheduled AVS/Calendar printed/ Patient added to infusions log due to CAP day on 6/18 as requested per 6/4 los

## 2017-08-13 ENCOUNTER — Telehealth: Payer: Self-pay

## 2017-08-13 ENCOUNTER — Telehealth: Payer: Self-pay | Admitting: Oncology

## 2017-08-13 LAB — CANCER ANTIGEN 19-9: CA 19-9: 194 U/mL — ABNORMAL HIGH (ref 0–35)

## 2017-08-13 NOTE — Telephone Encounter (Addendum)
Pt voiced understanding of message below   ----- Message from Owens Shark, NP sent at 08/13/2017  8:46 AM EDT ----- Please let him know the CA-19-9 tumor marker is better.

## 2017-08-13 NOTE — Telephone Encounter (Signed)
Appointments scheduled and patient updated on date/time per 6/4/ los

## 2017-08-24 ENCOUNTER — Other Ambulatory Visit: Payer: Self-pay | Admitting: Oncology

## 2017-08-26 ENCOUNTER — Inpatient Hospital Stay: Payer: PPO

## 2017-08-26 ENCOUNTER — Telehealth: Payer: Self-pay | Admitting: Oncology

## 2017-08-26 ENCOUNTER — Inpatient Hospital Stay (HOSPITAL_BASED_OUTPATIENT_CLINIC_OR_DEPARTMENT_OTHER): Payer: PPO | Admitting: Oncology

## 2017-08-26 VITALS — BP 118/76 | HR 59 | Temp 97.7°F | Resp 18 | Ht 73.0 in | Wt 205.2 lb

## 2017-08-26 DIAGNOSIS — C221 Intrahepatic bile duct carcinoma: Secondary | ICD-10-CM

## 2017-08-26 DIAGNOSIS — E785 Hyperlipidemia, unspecified: Secondary | ICD-10-CM | POA: Diagnosis not present

## 2017-08-26 DIAGNOSIS — R200 Anesthesia of skin: Secondary | ICD-10-CM | POA: Diagnosis not present

## 2017-08-26 DIAGNOSIS — Z85810 Personal history of malignant neoplasm of tongue: Secondary | ICD-10-CM

## 2017-08-26 DIAGNOSIS — R634 Abnormal weight loss: Secondary | ICD-10-CM

## 2017-08-26 DIAGNOSIS — Z95828 Presence of other vascular implants and grafts: Secondary | ICD-10-CM

## 2017-08-26 DIAGNOSIS — I739 Peripheral vascular disease, unspecified: Secondary | ICD-10-CM

## 2017-08-26 DIAGNOSIS — E039 Hypothyroidism, unspecified: Secondary | ICD-10-CM | POA: Diagnosis not present

## 2017-08-26 DIAGNOSIS — I10 Essential (primary) hypertension: Secondary | ICD-10-CM

## 2017-08-26 DIAGNOSIS — J45909 Unspecified asthma, uncomplicated: Secondary | ICD-10-CM | POA: Diagnosis not present

## 2017-08-26 DIAGNOSIS — Z5111 Encounter for antineoplastic chemotherapy: Secondary | ICD-10-CM | POA: Diagnosis not present

## 2017-08-26 LAB — CMP (CANCER CENTER ONLY)
ALBUMIN: 3.6 g/dL (ref 3.5–5.0)
ALT: 9 U/L (ref 0–55)
AST: 14 U/L (ref 5–34)
Alkaline Phosphatase: 65 U/L (ref 40–150)
Anion gap: 8 (ref 3–11)
BUN: 12 mg/dL (ref 7–26)
CHLORIDE: 105 mmol/L (ref 98–109)
CO2: 28 mmol/L (ref 22–29)
Calcium: 9.4 mg/dL (ref 8.4–10.4)
Creatinine: 0.97 mg/dL (ref 0.70–1.30)
GFR, Est AFR Am: >60 mL/min (ref >60)
Glucose, Bld: 135 mg/dL (ref 70–140)
POTASSIUM: 4 mmol/L (ref 3.5–5.1)
SODIUM: 141 mmol/L (ref 136–145)
Total Bilirubin: 0.2 mg/dL (ref 0.2–1.2)
Total Protein: 6.4 g/dL (ref 6.4–8.3)

## 2017-08-26 LAB — CBC WITH DIFFERENTIAL (CANCER CENTER ONLY)
Basophils Absolute: 0.1 10*3/uL (ref 0.0–0.1)
Basophils Relative: 1 %
EOS PCT: 5 %
Eosinophils Absolute: 0.3 10*3/uL (ref 0.0–0.5)
HCT: 27.3 % — ABNORMAL LOW (ref 38.4–49.9)
HEMOGLOBIN: 9 g/dL — AB (ref 13.0–17.1)
LYMPHS ABS: 0.8 10*3/uL — AB (ref 0.9–3.3)
LYMPHS PCT: 16 %
MCH: 31.1 pg (ref 27.2–33.4)
MCHC: 33.1 g/dL (ref 32.0–36.0)
MCV: 94.2 fL (ref 79.3–98.0)
MONOS PCT: 10 %
Monocytes Absolute: 0.5 10*3/uL (ref 0.1–0.9)
Neutro Abs: 3.6 10*3/uL (ref 1.5–6.5)
Neutrophils Relative %: 68 %
Platelet Count: 117 10*3/uL — ABNORMAL LOW (ref 140–400)
RBC: 2.9 MIL/uL — AB (ref 4.20–5.82)
RDW: 20.9 % — ABNORMAL HIGH (ref 11.0–14.6)
WBC: 5.4 10*3/uL (ref 4.0–10.3)

## 2017-08-26 LAB — MAGNESIUM: Magnesium: 1.6 mg/dL — ABNORMAL LOW (ref 1.7–2.4)

## 2017-08-26 MED ORDER — SODIUM CHLORIDE 0.9 % IV SOLN
1000.0000 mg/m2 | Freq: Once | INTRAVENOUS | Status: AC
  Administered 2017-08-26: 2166 mg via INTRAVENOUS
  Filled 2017-08-26: qty 56.97

## 2017-08-26 MED ORDER — POTASSIUM CHLORIDE 2 MEQ/ML IV SOLN
Freq: Once | INTRAVENOUS | Status: AC
  Administered 2017-08-26: 11:00:00 via INTRAVENOUS
  Filled 2017-08-26: qty 10

## 2017-08-26 MED ORDER — SODIUM CHLORIDE 0.9% FLUSH
10.0000 mL | INTRAVENOUS | Status: DC | PRN
  Administered 2017-08-26: 10 mL
  Filled 2017-08-26: qty 10

## 2017-08-26 MED ORDER — CISPLATIN CHEMO INJECTION 100MG/100ML
25.0000 mg/m2 | Freq: Once | INTRAVENOUS | Status: AC
  Administered 2017-08-26: 54 mg via INTRAVENOUS
  Filled 2017-08-26: qty 54

## 2017-08-26 MED ORDER — FOSAPREPITANT DIMEGLUMINE INJECTION 150 MG
Freq: Once | INTRAVENOUS | Status: AC
  Administered 2017-08-26: 13:00:00 via INTRAVENOUS
  Filled 2017-08-26: qty 5

## 2017-08-26 MED ORDER — PALONOSETRON HCL INJECTION 0.25 MG/5ML
0.2500 mg | Freq: Once | INTRAVENOUS | Status: AC
  Administered 2017-08-26: 0.25 mg via INTRAVENOUS

## 2017-08-26 MED ORDER — SODIUM CHLORIDE 0.9 % IV SOLN: Freq: Once | INTRAVENOUS | Status: DC

## 2017-08-26 MED ORDER — HEPARIN SOD (PORK) LOCK FLUSH 100 UNIT/ML IV SOLN
500.0000 [IU] | Freq: Once | INTRAVENOUS | Status: AC | PRN
  Administered 2017-08-26: 500 [IU]
  Filled 2017-08-26: qty 5

## 2017-08-26 NOTE — Patient Instructions (Signed)
[facility] Discharge Instructions for Patients Receiving Chemotherapy  Today you received the following chemotherapy agents :  Gemcitabine,  Cisplatin.  To help prevent nausea and vomiting after your treatment, we encourage you to take your nausea medication as prescribed.   If you develop nausea and vomiting that is not controlled by your nausea medication, call the clinic.   BELOW ARE SYMPTOMS THAT SHOULD BE REPORTED IMMEDIATELY:  *FEVER GREATER THAN 100.5 F  *CHILLS WITH OR WITHOUT FEVER  NAUSEA AND VOMITING THAT IS NOT CONTROLLED WITH YOUR NAUSEA MEDICATION  *UNUSUAL SHORTNESS OF BREATH  *UNUSUAL BRUISING OR BLEEDING  TENDERNESS IN MOUTH AND THROAT WITH OR WITHOUT PRESENCE OF ULCERS  *URINARY PROBLEMS  *BOWEL PROBLEMS  UNUSUAL RASH Items with * indicate a potential emergency and should be followed up as soon as possible.  Feel free to call the clinic should you have any questions or concerns. The clinic phone number is [phone].  Please show the CHEMO ALERT CARD at check-in to the Emergency Department and triage nurse.   

## 2017-08-26 NOTE — Telephone Encounter (Signed)
Scheduled appt per 6/18 los- gave patient aVS and calender per los.  

## 2017-08-26 NOTE — Progress Notes (Signed)
[place] OFFICE PROGRESS NOTE   Diagnosis: Cholangiocarcinoma  INTERVAL HISTORY:   [name] returns as scheduled.  He completed another treatment with gemcitabine/cisplatin on 08/12/2017.  He reports tolerating the chemotherapy well.  No nausea, fever, rash, or neuropathy symptoms.  He no longer has pain.  He is not taking pain medication.  He continues gabapentin.  Good appetite and energy level.  Objective:  Vital signs in last 24 hours:  There were no vitals taken for this visit.    HEENT: No thrush or ulcers Resp: Lungs clear bilaterally Cardio: Regular rate and rhythm GI: No hepatomegaly, no mass, nontender Vascular: No leg edema    Portacath/PICC-without erythema  Lab Results:  Lab Results  Component Value Date   WBC 4.6 08/12/2017   HGB 9.4 (L) 08/12/2017   HCT 28.2 (L) 08/12/2017   MCV 91.5 08/12/2017   PLT 140 08/12/2017   NEUTROABS 3.1 08/12/2017    CMP  Lab Results  Component Value Date   NA 140 08/12/2017   K 3.8 08/12/2017   CL 105 08/12/2017   CO2 27 08/12/2017   GLUCOSE 113 08/12/2017   BUN 11 08/12/2017   CREATININE 0.87 08/12/2017   CALCIUM 8.9 08/12/2017   PROT 6.5 08/12/2017   ALBUMIN 3.5 08/12/2017   AST 18 08/12/2017   ALT 10 08/12/2017   ALKPHOS 76 08/12/2017   BILITOT 0.3 08/12/2017   GFRNONAA >60 08/12/2017   GFRAA >60 08/12/2017     Medications: I have reviewed the patient's current medications.   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   CT abdomen/pelvis on 04/21/2017-7 cm mass lesion in the lateral segment of the left liver associated with smaller lesions in the anterior right liver and medial lobe left liver. This was associated with necrotic lymphadenopathy in the gastrohepatic and hepatoduodenal ligaments.  04/25/2017 status post upper  endoscopy and colonoscopy.   On the upper endoscopy he was found to have 1 moderate benign-appearing intrinsic stenosis at 40 cm from the incisors. This was dilated. The exam of the esophagus was otherwise normal. The entire examined stomach and duodenum also normal.   On the colonoscopy he was found to have a 5 mm polyp in the cecum which was removed. Pathology showed a serrated polyp with features of a sessile serrated polyp; negative for cytologic dysplasia. He was noted to have many small and large mouth diverticula in the sigmoid colon and descending colon. Terminal ileum appeared normal. Internal hemorrhoids were found as well.   04/28/2017 status post biopsy of a liver lesion. Pathology showed adenocarcinoma positive for cytokeratin 7 (strong), CDX-2 (weak) and focal TTF-1. Cytokeratin 20 essentially negative.  CT chest 05/12/2017-no evidence of metastatic disease or a primary chest tumor  Foundation1-microsatellite stable; tumor mutational burden 6 Muts/Mb; CDKN28 loss  Cycle 1 gemcitabine/cisplatin 07/01/2017  Cycle 2 gemcitabine/cisplatin 07/15/2017  Cycle 3 gemcitabine/cisplatin 07/29/2017  Cycle 4 gemcitabine/cisplatin 08/12/2017  Cycle 5 gemcitabine/cisplatin 08/26/2017 2. Weight loss-improved 3. 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 Alen Blew) 4. Asthma 5. Hypertension 6. Hyperlipidemia 7. Hypothyroid 8. Peripheral vascular disease    Disposition: Michael Mosley has completed 4 treatments with gemcitabine/cisplatin.  He has tolerated the treatment well and his clinical status is markedly improved.  He will complete cycle 5 today.  He would like to delay the next cycle of chemotherapy for  1 week.  He will undergo a restaging CT evaluation 09/15/2017 and return for an office visit and chemotherapy on 09/16/2017.  He will taper the gabapentin to once daily for 4-5 days and then  discontinue gabapentin.  Betsy Coder, MD  08/26/2017  8:34 AM

## 2017-09-13 ENCOUNTER — Other Ambulatory Visit: Payer: Self-pay | Admitting: Oncology

## 2017-09-15 ENCOUNTER — Other Ambulatory Visit: Payer: PPO

## 2017-09-15 ENCOUNTER — Ambulatory Visit (HOSPITAL_COMMUNITY)
Admission: RE | Admit: 2017-09-15 | Discharge: 2017-09-15 | Disposition: A | Discharge status: Home / Self Care | Payer: PPO | Source: Ambulatory Visit | Attending: Oncology | Admitting: Oncology

## 2017-09-15 ENCOUNTER — Encounter (HOSPITAL_COMMUNITY): Payer: Self-pay

## 2017-09-15 ENCOUNTER — Inpatient Hospital Stay: Payer: PPO | Attending: Oncology

## 2017-09-15 ENCOUNTER — Inpatient Hospital Stay (HOSPITAL_BASED_OUTPATIENT_CLINIC_OR_DEPARTMENT_OTHER): Payer: PPO

## 2017-09-15 DIAGNOSIS — E785 Hyperlipidemia, unspecified: Secondary | ICD-10-CM | POA: Insufficient documentation

## 2017-09-15 DIAGNOSIS — E039 Hypothyroidism, unspecified: Secondary | ICD-10-CM | POA: Insufficient documentation

## 2017-09-15 DIAGNOSIS — I714 Abdominal aortic aneurysm, without rupture: Secondary | ICD-10-CM | POA: Insufficient documentation

## 2017-09-15 DIAGNOSIS — I700 Atherosclerosis of aorta: Secondary | ICD-10-CM | POA: Diagnosis not present

## 2017-09-15 DIAGNOSIS — C787 Secondary malignant neoplasm of liver and intrahepatic bile duct: Secondary | ICD-10-CM | POA: Diagnosis not present

## 2017-09-15 DIAGNOSIS — J45909 Unspecified asthma, uncomplicated: Secondary | ICD-10-CM | POA: Diagnosis not present

## 2017-09-15 DIAGNOSIS — R590 Localized enlarged lymph nodes: Secondary | ICD-10-CM | POA: Diagnosis not present

## 2017-09-15 DIAGNOSIS — C221 Intrahepatic bile duct carcinoma: Secondary | ICD-10-CM | POA: Insufficient documentation

## 2017-09-15 DIAGNOSIS — I739 Peripheral vascular disease, unspecified: Secondary | ICD-10-CM | POA: Insufficient documentation

## 2017-09-15 DIAGNOSIS — Z5111 Encounter for antineoplastic chemotherapy: Secondary | ICD-10-CM | POA: Diagnosis not present

## 2017-09-15 DIAGNOSIS — Z95828 Presence of other vascular implants and grafts: Secondary | ICD-10-CM

## 2017-09-15 LAB — CBC WITH DIFFERENTIAL (CANCER CENTER ONLY)
Basophils Absolute: 0.1 10*3/uL (ref 0.0–0.1)
Basophils Relative: 2 %
EOS ABS: 0.5 10*3/uL (ref 0.0–0.5)
Eosinophils Relative: 10 %
HEMATOCRIT: 30.7 % — AB (ref 38.4–49.9)
HEMOGLOBIN: 10 g/dL — AB (ref 13.0–17.1)
Lymphocytes Relative: 22 %
Lymphs Abs: 1.1 10*3/uL (ref 0.9–3.3)
MCH: 31.6 pg (ref 27.2–33.4)
MCHC: 32.6 g/dL (ref 32.0–36.0)
MCV: 97.2 fL (ref 79.3–98.0)
Monocytes Absolute: 0.7 10*3/uL (ref 0.1–0.9)
Monocytes Relative: 15 %
NEUTROS ABS: 2.5 10*3/uL (ref 1.5–6.5)
NEUTROS PCT: 51 %
Platelet Count: 225 10*3/uL (ref 140–400)
RBC: 3.16 MIL/uL — AB (ref 4.20–5.82)
RDW: 19.4 % — ABNORMAL HIGH (ref 11.0–14.6)
WBC: 4.8 10*3/uL (ref 4.0–10.3)

## 2017-09-15 LAB — CMP (CANCER CENTER ONLY)
ALBUMIN: 4 g/dL (ref 3.5–5.0)
ALK PHOS: 69 U/L (ref 38–126)
ALT: 13 U/L (ref 0–44)
ANION GAP: 6 (ref 5–15)
AST: 17 U/L (ref 15–41)
BILIRUBIN TOTAL: 0.3 mg/dL (ref 0.3–1.2)
BUN: 14 mg/dL (ref 8–23)
CALCIUM: 10.2 mg/dL (ref 8.9–10.3)
CO2: 31 mmol/L (ref 22–32)
CREATININE: 0.97 mg/dL (ref 0.61–1.24)
Chloride: 102 mmol/L (ref 98–111)
GFR, Est AFR Am: >60 mL/min (ref >60)
GFR, Estimated: >60 mL/min (ref >60)
GLUCOSE: 106 mg/dL — AB (ref 70–99)
Potassium: 4.2 mmol/L (ref 3.5–5.1)
SODIUM: 139 mmol/L (ref 135–145)
TOTAL PROTEIN: 7.1 g/dL (ref 6.5–8.1)

## 2017-09-15 LAB — MAGNESIUM: Magnesium: 1.8 mg/dL (ref 1.7–2.4)

## 2017-09-15 MED ORDER — IOPAMIDOL (ISOVUE-300) INJECTION 61%
INTRAVENOUS | Status: AC
  Filled 2017-09-15: qty 100

## 2017-09-15 MED ORDER — HEPARIN SOD (PORK) LOCK FLUSH 100 UNIT/ML IV SOLN
500.0000 [IU] | Freq: Once | INTRAVENOUS | Status: AC
  Administered 2017-09-15: 500 [IU] via INTRAVENOUS

## 2017-09-15 MED ORDER — IOPAMIDOL (ISOVUE-300) INJECTION 61%
100.0000 mL | Freq: Once | INTRAVENOUS | Status: AC | PRN
  Administered 2017-09-15: 100 mL via INTRAVENOUS

## 2017-09-15 MED ORDER — HEPARIN SOD (PORK) LOCK FLUSH 100 UNIT/ML IV SOLN
INTRAVENOUS | Status: AC
  Filled 2017-09-15: qty 5

## 2017-09-15 MED ORDER — SODIUM CHLORIDE 0.9% FLUSH
10.0000 mL | INTRAVENOUS | Status: DC | PRN
  Administered 2017-09-15: 10 mL
  Filled 2017-09-15: qty 10

## 2017-09-15 NOTE — Progress Notes (Signed)
Completed on 09/15/2017 

## 2017-09-16 ENCOUNTER — Inpatient Hospital Stay (HOSPITAL_BASED_OUTPATIENT_CLINIC_OR_DEPARTMENT_OTHER): Payer: PPO | Admitting: Oncology

## 2017-09-16 ENCOUNTER — Telehealth: Payer: Self-pay

## 2017-09-16 ENCOUNTER — Inpatient Hospital Stay: Payer: PPO

## 2017-09-16 VITALS — BP 155/93 | HR 60 | Temp 97.7°F | Resp 18 | Ht 73.0 in | Wt 204.0 lb

## 2017-09-16 DIAGNOSIS — C221 Intrahepatic bile duct carcinoma: Secondary | ICD-10-CM

## 2017-09-16 DIAGNOSIS — Z5111 Encounter for antineoplastic chemotherapy: Secondary | ICD-10-CM | POA: Diagnosis not present

## 2017-09-16 LAB — CANCER ANTIGEN 19-9: CA 19-9: 64 U/mL — ABNORMAL HIGH (ref 0–35)

## 2017-09-16 MED ORDER — SODIUM CHLORIDE 0.9% FLUSH
10.0000 mL | INTRAVENOUS | Status: DC | PRN
  Administered 2017-09-16: 10 mL
  Filled 2017-09-16: qty 10

## 2017-09-16 MED ORDER — PALONOSETRON HCL INJECTION 0.25 MG/5ML
INTRAVENOUS | Status: AC
  Filled 2017-09-16: qty 5

## 2017-09-16 MED ORDER — SODIUM CHLORIDE 0.9 % IV SOLN
Freq: Once | INTRAVENOUS | Status: AC
  Administered 2017-09-16: 10:00:00 via INTRAVENOUS

## 2017-09-16 MED ORDER — PALONOSETRON HCL INJECTION 0.25 MG/5ML
0.2500 mg | Freq: Once | INTRAVENOUS | Status: AC
  Administered 2017-09-16: 0.25 mg via INTRAVENOUS

## 2017-09-16 MED ORDER — SODIUM CHLORIDE 0.9 % IV SOLN
Freq: Once | INTRAVENOUS | Status: AC
  Administered 2017-09-16: 11:00:00 via INTRAVENOUS
  Filled 2017-09-16: qty 5

## 2017-09-16 MED ORDER — SODIUM CHLORIDE 0.9 % IV SOLN
25.0000 mg/m2 | Freq: Once | INTRAVENOUS | Status: AC
  Administered 2017-09-16: 54 mg via INTRAVENOUS
  Filled 2017-09-16: qty 54

## 2017-09-16 MED ORDER — SODIUM CHLORIDE 0.9 % IV SOLN
1000.0000 mg/m2 | Freq: Once | INTRAVENOUS | Status: AC
  Administered 2017-09-16: 2166 mg via INTRAVENOUS
  Filled 2017-09-16: qty 56.97

## 2017-09-16 MED ORDER — POTASSIUM CHLORIDE 2 MEQ/ML IV SOLN
Freq: Once | INTRAVENOUS | Status: AC
  Administered 2017-09-16: 10:00:00 via INTRAVENOUS
  Filled 2017-09-16: qty 10

## 2017-09-16 MED ORDER — HEPARIN SOD (PORK) LOCK FLUSH 100 UNIT/ML IV SOLN
500.0000 [IU] | Freq: Once | INTRAVENOUS | Status: AC | PRN
  Administered 2017-09-16: 500 [IU]
  Filled 2017-09-16: qty 5

## 2017-09-16 NOTE — Patient Instructions (Signed)
[facility] Discharge Instructions for Patients Receiving Chemotherapy  Today you received the following chemotherapy agents Gemzar, Cisplatin  To help prevent nausea and vomiting after your treatment, we encourage you to take your nausea medication as directed   If you develop nausea and vomiting that is not controlled by your nausea medication, call the clinic.   BELOW ARE SYMPTOMS THAT SHOULD BE REPORTED IMMEDIATELY:  *FEVER GREATER THAN 100.5 F  *CHILLS WITH OR WITHOUT FEVER  NAUSEA AND VOMITING THAT IS NOT CONTROLLED WITH YOUR NAUSEA MEDICATION  *UNUSUAL SHORTNESS OF BREATH  *UNUSUAL BRUISING OR BLEEDING  TENDERNESS IN MOUTH AND THROAT WITH OR WITHOUT PRESENCE OF ULCERS  *URINARY PROBLEMS  *BOWEL PROBLEMS  UNUSUAL RASH Items with * indicate a potential emergency and should be followed up as soon as possible.  Feel free to call the clinic should you have any questions or concerns. The clinic phone number is [phone].  Please show the CHEMO ALERT CARD at check-in to the Emergency Department and triage nurse.   

## 2017-09-16 NOTE — Progress Notes (Signed)
[place] OFFICE PROGRESS NOTE   Diagnosis: Cholangiocarcinoma  INTERVAL HISTORY:   Michael Mosley returns as scheduled.  He completed another cycle of gemcitabine/cisplatin on 08/26/2017.  No fever, rash, or neuropathy symptoms.  No pain.  He feels well.  Objective:  Vital signs in last 24 hours:  Blood pressure (!) 155/93, pulse 60, temperature 97.7 F (36.5 C), temperature source Oral, resp. rate 18, height '6\' 1"'  (1.854 m), weight 204 lb (92.5 kg), SpO2 100 %.    HEENT: No thrush or ulcers Resp: Lungs clear bilaterally Cardio: Regular rate and rhythm GI: No hepatomegaly, no mass, nontender Vascular: No leg edema    Portacath/PICC-without erythema  Lab Results:  Lab Results  Component Value Date   WBC 4.8 09/15/2017   HGB 10.0 (L) 09/15/2017   HCT 30.7 (L) 09/15/2017   MCV 97.2 09/15/2017   PLT 225 09/15/2017   NEUTROABS 2.5 09/15/2017    CMP  Lab Results  Component Value Date   NA 139 09/15/2017   K 4.2 09/15/2017   CL 102 09/15/2017   CO2 31 09/15/2017   GLUCOSE 106 (H) 09/15/2017   BUN 14 09/15/2017   CREATININE 0.97 09/15/2017   CALCIUM 10.2 09/15/2017   PROT 7.1 09/15/2017   ALBUMIN 4.0 09/15/2017   AST 17 09/15/2017   ALT 13 09/15/2017   ALKPHOS 69 09/15/2017   BILITOT 0.3 09/15/2017   GFRNONAA >60 09/15/2017   GFRAA >60 09/15/2017   CA 19-9 : 64  Imaging:  Ct Abdomen Pelvis W Contrast  Result Date: 09/15/2017 CLINICAL DATA:  Restaging cholangiocarcinoma. EXAM: CT ABDOMEN AND PELVIS WITH CONTRAST TECHNIQUE: Multidetector CT imaging of the abdomen and pelvis was performed using the standard protocol following bolus administration of intravenous contrast. CONTRAST:  171m ISOVUE-300 IOPAMIDOL (ISOVUE-300) INJECTION 61% COMPARISON:  CT scan 06/03/2017 and 04/21/2017 FINDINGS: Lower chest: The lung bases are clear of an acute process. No worrisome pulmonary nodules to suggest metastatic disease. The heart is normal in size. Stable  coronary artery calcifications. Hepatobiliary: Large segment 2 liver lesion appears less well-defined and perhaps slightly more necrotic. It measures a maximum of 7.2 x 5.5 cm and previously measured 7.8 x 6.6 cm. Metastatic lesion in segment 4A measures 8 mm and previously measured 18.5 mm. Metastatic lesion in segment 8 measures 9 mm and previously measured 20.5 mm. No new hepatic lesions. The gallbladder is mildly contracted. No intra or extrahepatic biliary dilatation. Pancreas: No mass, inflammation or ductal dilatation. Spleen: Normal size.  No focal lesions. Adrenals/Urinary Tract: The adrenal glands are unremarkable and stable. Both kidneys appear normal. The bladder is unremarkable. Stomach/Bowel: The stomach, duodenum, small bowel and colon are grossly normal. No acute inflammatory changes, mass lesions or obstructive findings. Vascular/Lymphatic: Stable age advanced atherosclerotic calcifications involving the aorta and iliac arteries. 3 cm infrarenal abdominal aortic aneurysm. Much improved gastrohepatic ligament lymphadenopathy. 9 mm node on image number 18 previously measured 26 mm. Adjacent 12.5 mm lymph node on image number 23 previously measured 17 mm. 12 mm celiac axis lymph node on image number 25 previously measured 22 mm. Small scattered retroperitoneal lymph nodes appear stable. 12.5 mm left para-aortic node on image number 35 previously measured 11 mm. Reproductive: The prostate gland and seminal vesicles are unremarkable. Other: No pelvic mass or pelvic adenopathy. No free pelvic fluid collections. No inguinal adenopathy. Musculoskeletal: No significant bony findings. IMPRESSION: 1. Improved CT appearance of the liver. The large left hepatic lobe mass is slightly smaller and the small metastatic  lesions have also improved as detailed above. 2. Regression of upper abdominal lymphadenopathy. 3. No new or progressive findings are identified. Electronically Signed   By: Marijo Sanes M.D.   On:  09/15/2017 16:16    Medications: I have reviewed the patient's current medications.   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   CT abdomen/pelvis on 04/21/2017-7 cm mass lesion in the lateral segment of the left liver associated with smaller lesions in the anterior right liver and medial lobe left liver. This was associated with necrotic lymphadenopathy in the gastrohepatic and hepatoduodenal ligaments.  04/25/2017 status post upper endoscopy and colonoscopy.   On the upper endoscopy he was found to have 1 moderate benign-appearing intrinsic stenosis at 40 cm from the incisors. This was dilated. The exam of the esophagus was otherwise normal. The entire examined stomach and duodenum also normal.   On the colonoscopy he was found to have a 5 mm polyp in the cecum which was removed. Pathology showed a serrated polyp with features of a sessile serrated polyp; negative for cytologic dysplasia. He was noted to have many small and large mouth diverticula in the sigmoid colon and descending colon. Terminal ileum appeared normal. Internal hemorrhoids were found as well.   04/28/2017 status post biopsy of a liver lesion. Pathology showed adenocarcinoma positive for cytokeratin 7 (strong), CDX-2 (weak) and focal TTF-1. Cytokeratin 20 essentially negative.  CT chest 05/12/2017-no evidence of metastatic disease or a primary chest tumor  Foundation1-microsatellite stable; tumor mutational burden 6 Muts/Mb; [id number] loss  Cycle 1 gemcitabine/cisplatin 07/01/2017  Cycle 2 gemcitabine/cisplatin 07/15/2017  Cycle 3 gemcitabine/cisplatin 07/29/2017  Cycle 4 gemcitabine/cisplatin 08/12/2017  Cycle 5 gemcitabine/cisplatin 08/26/2017  CT 09/15/2017- decreased size of dominant and smaller hepatic lesions, improved upper abdominal  lymphadenopathy  Cycle 6 gemcitabine/cisplatin 09/16/2017 2. Weight loss-improved 3. 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 Alen Blew) 4. Asthma 5. Hypertension 6. Hyperlipidemia 7. Hypothyroid 8. Peripheral vascular disease    Disposition: Michael Mosley has completed 5 treatments with gemcitabine/cisplatin.  There has been clinical improvement.  The CT confirms a response to therapy.  The CA 19-9 is lower.  I reviewed the CT images and discussed treatment options with Michael Mosley.  He understands chemotherapy will not be curative.  I recommend continuing gemcitabine/cisplatin until a maximum response is achieved.  He will complete approximately 5 more cycles of gemcitabine/cisplatin prior to another restaging CT evaluation.  Michael Mosley will complete cycle 6 chemotherapy today.  He will return for an office visit and chemotherapy in 2 weeks.  25 minutes were spent with the patient today.  The majority of the time was used for counseling and coordination of care.  Betsy Coder, MD  09/16/2017  8:57 AM

## 2017-09-16 NOTE — Telephone Encounter (Signed)
Printed avs and calender of upcoming appointments. Per 7/9 los Had to divide visit due to 7 hour treatment.

## 2017-09-25 ENCOUNTER — Other Ambulatory Visit: Payer: Self-pay | Admitting: Oncology

## 2017-09-29 ENCOUNTER — Inpatient Hospital Stay: Payer: PPO

## 2017-09-29 ENCOUNTER — Inpatient Hospital Stay (HOSPITAL_BASED_OUTPATIENT_CLINIC_OR_DEPARTMENT_OTHER): Payer: PPO | Admitting: Nurse Practitioner

## 2017-09-29 ENCOUNTER — Other Ambulatory Visit: Payer: Self-pay | Admitting: Oncology

## 2017-09-29 ENCOUNTER — Encounter: Payer: Self-pay | Admitting: Nurse Practitioner

## 2017-09-29 VITALS — BP 145/80 | HR 59 | Temp 97.7°F | Resp 17 | Ht 73.0 in | Wt 207.8 lb

## 2017-09-29 DIAGNOSIS — E039 Hypothyroidism, unspecified: Secondary | ICD-10-CM | POA: Diagnosis not present

## 2017-09-29 DIAGNOSIS — C787 Secondary malignant neoplasm of liver and intrahepatic bile duct: Secondary | ICD-10-CM

## 2017-09-29 DIAGNOSIS — C221 Intrahepatic bile duct carcinoma: Secondary | ICD-10-CM

## 2017-09-29 DIAGNOSIS — Z5111 Encounter for antineoplastic chemotherapy: Secondary | ICD-10-CM | POA: Diagnosis not present

## 2017-09-29 DIAGNOSIS — I700 Atherosclerosis of aorta: Secondary | ICD-10-CM

## 2017-09-29 DIAGNOSIS — J45909 Unspecified asthma, uncomplicated: Secondary | ICD-10-CM | POA: Diagnosis not present

## 2017-09-29 DIAGNOSIS — Z95828 Presence of other vascular implants and grafts: Secondary | ICD-10-CM

## 2017-09-29 DIAGNOSIS — I739 Peripheral vascular disease, unspecified: Secondary | ICD-10-CM

## 2017-09-29 DIAGNOSIS — I714 Abdominal aortic aneurysm, without rupture: Secondary | ICD-10-CM | POA: Diagnosis not present

## 2017-09-29 LAB — CBC WITH DIFFERENTIAL (CANCER CENTER ONLY)
BASOS PCT: 1 %
Basophils Absolute: 0 10*3/uL (ref 0.0–0.1)
EOS ABS: 0.3 10*3/uL (ref 0.0–0.5)
Eosinophils Relative: 7 %
HCT: 31.8 % — ABNORMAL LOW (ref 38.4–49.9)
Hemoglobin: 10.5 g/dL — ABNORMAL LOW (ref 13.0–17.1)
Lymphocytes Relative: 21 %
Lymphs Abs: 1 10*3/uL (ref 0.9–3.3)
MCH: 31.8 pg (ref 27.2–33.4)
MCHC: 33.1 g/dL (ref 32.0–36.0)
MCV: 96.2 fL (ref 79.3–98.0)
Monocytes Absolute: 0.5 10*3/uL (ref 0.1–0.9)
Monocytes Relative: 11 %
NEUTROS PCT: 60 %
Neutro Abs: 2.8 10*3/uL (ref 1.5–6.5)
Platelet Count: 92 10*3/uL — ABNORMAL LOW (ref 140–400)
RBC: 3.31 MIL/uL — AB (ref 4.20–5.82)
RDW: 20.3 % — ABNORMAL HIGH (ref 11.0–14.6)
WBC: 4.7 10*3/uL (ref 4.0–10.3)

## 2017-09-29 LAB — CMP (CANCER CENTER ONLY)
ALBUMIN: 3.9 g/dL (ref 3.5–5.0)
ALT: 11 U/L (ref 0–44)
AST: 15 U/L (ref 15–41)
Alkaline Phosphatase: 60 U/L (ref 38–126)
Anion gap: 6 (ref 5–15)
BUN: 12 mg/dL (ref 8–23)
CALCIUM: 9.2 mg/dL (ref 8.9–10.3)
CHLORIDE: 104 mmol/L (ref 98–111)
CO2: 28 mmol/L (ref 22–32)
CREATININE: 0.92 mg/dL (ref 0.61–1.24)
GFR, Est AFR Am: >60 mL/min (ref >60)
GFR, Estimated: >60 mL/min (ref >60)
GLUCOSE: 101 mg/dL — AB (ref 70–99)
Potassium: 4.1 mmol/L (ref 3.5–5.1)
Sodium: 138 mmol/L (ref 135–145)
Total Bilirubin: 0.3 mg/dL (ref 0.3–1.2)
Total Protein: 7.1 g/dL (ref 6.5–8.1)

## 2017-09-29 LAB — MAGNESIUM: Magnesium: 1.8 mg/dL (ref 1.7–2.4)

## 2017-09-29 MED ORDER — SODIUM CHLORIDE 0.9% FLUSH
10.0000 mL | INTRAVENOUS | Status: DC | PRN
  Administered 2017-09-29: 10 mL
  Filled 2017-09-29: qty 10

## 2017-09-29 MED ORDER — HEPARIN SOD (PORK) LOCK FLUSH 100 UNIT/ML IV SOLN
500.0000 [IU] | Freq: Once | INTRAVENOUS | Status: AC | PRN
  Administered 2017-09-29: 500 [IU]
  Filled 2017-09-29: qty 5

## 2017-09-29 NOTE — Progress Notes (Signed)
[place] [facility] OFFICE PROGRESS NOTE   Diagnosis: Cholangiocarcinoma  INTERVAL HISTORY:   Michael Mosley returns as scheduled.  He completed cycle 6 gemcitabine/cisplatin 09/16/2017.  He denies nausea/vomiting.  No mouth sores.  No diarrhea.  No numbness or tingling in his hands or feet.  He denies pain.  He has a good appetite.  He intermittently notes dizziness/lightheadedness after he bends over.  No unusual headaches or vision change.  In retrospect he thinks his may be related to poor fluid intake.  No hearing loss.  No tinnitus.  Objective:  Vital signs in last 24 hours:  Blood pressure (!) 145/80, pulse (!) 59, temperature 97.7 F (36.5 C), temperature source Oral, resp. rate 17, height '6\' 1"'  (1.854 m), weight 207 lb 12.8 oz (94.3 kg), SpO2 100 %.    HEENT: No thrush or ulcers. Resp: Lungs clear bilaterally. Cardio: Regular rate and rhythm. GI: Abdomen soft and nontender.  No hepatomegaly. Vascular: No leg edema.  Calves soft and nontender. Neuro: Vibratory sense intact over the fingertips for tuning fork exam. Skin: No rash. Port-A-Cath without erythema.   Lab Results:  Lab Results  Component Value Date   WBC 4.7 09/29/2017   HGB 10.5 (L) 09/29/2017   HCT 31.8 (L) 09/29/2017   MCV 96.2 09/29/2017   PLT 92 (L) 09/29/2017   NEUTROABS 2.8 09/29/2017    Imaging:  No results found.  Medications: I have reviewed the patient's current medications.  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   CT abdomen/pelvis on 04/21/2017-7 cm mass lesion in the lateral segment of the left liver associated with smaller lesions in the anterior right liver and medial lobe left liver. This was associated with necrotic lymphadenopathy in the gastrohepatic and hepatoduodenal ligaments.  04/25/2017 status post upper  endoscopy and colonoscopy.   On the upper endoscopy he was found to have 1 moderate benign-appearing intrinsic stenosis at 40 cm from the incisors. This was dilated. The exam of the esophagus was otherwise normal. The entire examined stomach and duodenum also normal.   On the colonoscopy he was found to have a 5 mm polyp in the cecum which was removed. Pathology showed a serrated polyp with features of a sessile serrated polyp; negative for cytologic dysplasia. He was noted to have many small and large mouth diverticula in the sigmoid colon and descending colon. Terminal ileum appeared normal. Internal hemorrhoids were found as well.   04/28/2017 status post biopsy of a liver lesion. Pathology showed adenocarcinoma positive for cytokeratin 7 (strong), CDX-2 (weak) and focal TTF-1. Cytokeratin 20 essentially negative.  CT chest 05/12/2017-no evidence of metastatic disease or a primary chest tumor  Foundation1-microsatellite stable; tumor mutational burden 6 Muts/Mb; YSAY30 loss  Cycle 1 gemcitabine/cisplatin 07/01/2017  Cycle 2 gemcitabine/cisplatin 07/15/2017  Cycle 3 gemcitabine/cisplatin 07/29/2017  Cycle 4 gemcitabine/cisplatin 08/12/2017  Cycle 5 gemcitabine/cisplatin 08/26/2017  CT 09/15/2017- decreased size of dominant and smaller hepatic lesions, improved upper abdominal lymphadenopathy  Cycle 6 gemcitabine/cisplatin 09/16/2017  Cycle 7 gemcitabine/cisplatin 09/29/2017 2. Weight loss-improved 3. 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 Alen Blew) 4. Asthma 5. Hypertension 6. Hyperlipidemia 7. Hypothyroid 8. Peripheral vascular disease   Disposition: Michael Mosley appears stable.  He has completed 6 cycles of gemcitabine/cisplatin.  There is no clinical evidence of disease progression.  Plan to proceed with cycle 7 as scheduled on  09/30/2017.  We reviewed the CBC from today.  He has mild  thrombocytopenia.  I discussed this with Dr. Benay Spice.  Plan to decrease the gemcitabine from 1000 mg/m to 800 mg/m.  Michael Mosley reports intermittent lightheadedness/dizziness at today's visit.  He will try to increase fluid intake.  He will contact the office if this persists.  He will return for lab, follow-up and cycle 8 gemcitabine/cisplatin in 2 weeks.  He will contact the office in the interim with any problems.    Ned Card ANP/GNP-BC   09/29/2017  1:29 PM

## 2017-09-30 ENCOUNTER — Telehealth: Payer: Self-pay | Admitting: Nurse Practitioner

## 2017-09-30 ENCOUNTER — Inpatient Hospital Stay: Payer: PPO

## 2017-09-30 VITALS — BP 124/73 | HR 57 | Temp 98.4°F | Resp 17

## 2017-09-30 DIAGNOSIS — Z5111 Encounter for antineoplastic chemotherapy: Secondary | ICD-10-CM | POA: Diagnosis not present

## 2017-09-30 DIAGNOSIS — C221 Intrahepatic bile duct carcinoma: Secondary | ICD-10-CM

## 2017-09-30 MED ORDER — SODIUM CHLORIDE 0.9 % IV SOLN
25.0000 mg/m2 | Freq: Once | INTRAVENOUS | Status: AC
  Administered 2017-09-30: 54 mg via INTRAVENOUS
  Filled 2017-09-30: qty 54

## 2017-09-30 MED ORDER — SODIUM CHLORIDE 0.9 % IV SOLN
Freq: Once | INTRAVENOUS | Status: AC
  Administered 2017-09-30: 11:00:00 via INTRAVENOUS
  Filled 2017-09-30: qty 5

## 2017-09-30 MED ORDER — SODIUM CHLORIDE 0.9 % IV SOLN
800.0000 mg/m2 | Freq: Once | INTRAVENOUS | Status: AC
  Administered 2017-09-30: 1710 mg via INTRAVENOUS
  Filled 2017-09-30: qty 44.97

## 2017-09-30 MED ORDER — PALONOSETRON HCL INJECTION 0.25 MG/5ML
0.2500 mg | Freq: Once | INTRAVENOUS | Status: AC
  Administered 2017-09-30: 0.25 mg via INTRAVENOUS

## 2017-09-30 MED ORDER — SODIUM CHLORIDE 0.9 % IV SOLN
Freq: Once | INTRAVENOUS | Status: AC
  Administered 2017-09-30: 11:00:00 via INTRAVENOUS

## 2017-09-30 MED ORDER — POTASSIUM CHLORIDE 2 MEQ/ML IV SOLN
Freq: Once | INTRAVENOUS | Status: AC
  Administered 2017-09-30: 09:00:00 via INTRAVENOUS
  Filled 2017-09-30: qty 10

## 2017-09-30 MED ORDER — PALONOSETRON HCL INJECTION 0.25 MG/5ML
INTRAVENOUS | Status: AC
  Filled 2017-09-30: qty 5

## 2017-09-30 MED ORDER — HEPARIN SOD (PORK) LOCK FLUSH 100 UNIT/ML IV SOLN
500.0000 [IU] | Freq: Once | INTRAVENOUS | Status: AC | PRN
  Administered 2017-09-30: 500 [IU]
  Filled 2017-09-30: qty 5

## 2017-09-30 MED ORDER — SODIUM CHLORIDE 0.9% FLUSH
10.0000 mL | INTRAVENOUS | Status: DC | PRN
  Administered 2017-09-30: 10 mL
  Filled 2017-09-30: qty 10

## 2017-09-30 NOTE — Telephone Encounter (Signed)
Scheduled appt per 7/22 los - pt to get an updated schedule after treatment /

## 2017-10-11 ENCOUNTER — Other Ambulatory Visit: Payer: Self-pay | Admitting: Oncology

## 2017-10-13 ENCOUNTER — Telehealth: Payer: Self-pay

## 2017-10-13 DIAGNOSIS — I6521 Occlusion and stenosis of right carotid artery: Secondary | ICD-10-CM | POA: Diagnosis not present

## 2017-10-13 DIAGNOSIS — C220 Liver cell carcinoma: Secondary | ICD-10-CM | POA: Diagnosis not present

## 2017-10-13 DIAGNOSIS — H34231 Retinal artery branch occlusion, right eye: Secondary | ICD-10-CM | POA: Diagnosis not present

## 2017-10-13 DIAGNOSIS — I25118 Atherosclerotic heart disease of native coronary artery with other forms of angina pectoris: Secondary | ICD-10-CM | POA: Diagnosis not present

## 2017-10-13 DIAGNOSIS — H53452 Other localized visual field defect, left eye: Secondary | ICD-10-CM | POA: Diagnosis not present

## 2017-10-13 DIAGNOSIS — H3581 Retinal edema: Secondary | ICD-10-CM | POA: Diagnosis not present

## 2017-10-13 DIAGNOSIS — H3411 Central retinal artery occlusion, right eye: Secondary | ICD-10-CM | POA: Diagnosis not present

## 2017-10-13 NOTE — Telephone Encounter (Signed)
Received call from pt. Pt states "I lost sight in half of my R eye. The bottom half of my vision is gray". Pt also mentions "I may need to see Dr. [name] again, I was seeing him before we found the cancer and he was going to do a bypass but wanted to wait until I finished with chemo". This RN returned call to pt. Pt denies unilateral weakness, no pain or headache and no other symptoms accompanying. Per Dr. Benay Spice, pt needs to see any eye doctor today. Pt states he is also in communication with two opthalmology offices for an appt. This RN will make MD aware and follow up on opthalmology appt.   Returned call to pt to check in. Pt states he is currently being seen in Dr. Zenia Resides office. Pt knows to keep our office updated with any changes.

## 2017-10-14 ENCOUNTER — Inpatient Hospital Stay: Payer: PPO

## 2017-10-14 ENCOUNTER — Encounter: Payer: Self-pay | Admitting: Nurse Practitioner

## 2017-10-14 ENCOUNTER — Inpatient Hospital Stay: Payer: PPO | Attending: Oncology

## 2017-10-14 ENCOUNTER — Inpatient Hospital Stay (HOSPITAL_BASED_OUTPATIENT_CLINIC_OR_DEPARTMENT_OTHER): Payer: PPO | Admitting: Nurse Practitioner

## 2017-10-14 VITALS — BP 138/85 | HR 60 | Temp 98.0°F | Resp 17 | Ht 73.0 in | Wt 204.0 lb

## 2017-10-14 DIAGNOSIS — Z86010 Personal history of colonic polyps: Secondary | ICD-10-CM | POA: Diagnosis not present

## 2017-10-14 DIAGNOSIS — I739 Peripheral vascular disease, unspecified: Secondary | ICD-10-CM | POA: Insufficient documentation

## 2017-10-14 DIAGNOSIS — K760 Fatty (change of) liver, not elsewhere classified: Secondary | ICD-10-CM | POA: Diagnosis not present

## 2017-10-14 DIAGNOSIS — Z803 Family history of malignant neoplasm of breast: Secondary | ICD-10-CM

## 2017-10-14 DIAGNOSIS — Z7984 Long term (current) use of oral hypoglycemic drugs: Secondary | ICD-10-CM

## 2017-10-14 DIAGNOSIS — I4891 Unspecified atrial fibrillation: Secondary | ICD-10-CM

## 2017-10-14 DIAGNOSIS — E119 Type 2 diabetes mellitus without complications: Secondary | ICD-10-CM

## 2017-10-14 DIAGNOSIS — I10 Essential (primary) hypertension: Secondary | ICD-10-CM

## 2017-10-14 DIAGNOSIS — K219 Gastro-esophageal reflux disease without esophagitis: Secondary | ICD-10-CM | POA: Diagnosis not present

## 2017-10-14 DIAGNOSIS — Z5111 Encounter for antineoplastic chemotherapy: Secondary | ICD-10-CM | POA: Diagnosis not present

## 2017-10-14 DIAGNOSIS — E039 Hypothyroidism, unspecified: Secondary | ICD-10-CM | POA: Diagnosis not present

## 2017-10-14 DIAGNOSIS — C221 Intrahepatic bile duct carcinoma: Secondary | ICD-10-CM | POA: Diagnosis not present

## 2017-10-14 DIAGNOSIS — D500 Iron deficiency anemia secondary to blood loss (chronic): Secondary | ICD-10-CM | POA: Diagnosis not present

## 2017-10-14 DIAGNOSIS — Z79899 Other long term (current) drug therapy: Secondary | ICD-10-CM

## 2017-10-14 DIAGNOSIS — Z85038 Personal history of other malignant neoplasm of large intestine: Secondary | ICD-10-CM

## 2017-10-14 DIAGNOSIS — Z87891 Personal history of nicotine dependence: Secondary | ICD-10-CM

## 2017-10-14 DIAGNOSIS — J45909 Unspecified asthma, uncomplicated: Secondary | ICD-10-CM | POA: Insufficient documentation

## 2017-10-14 DIAGNOSIS — E785 Hyperlipidemia, unspecified: Secondary | ICD-10-CM | POA: Diagnosis not present

## 2017-10-14 DIAGNOSIS — Z7901 Long term (current) use of anticoagulants: Secondary | ICD-10-CM | POA: Diagnosis not present

## 2017-10-14 DIAGNOSIS — H5461 Unqualified visual loss, right eye, normal vision left eye: Secondary | ICD-10-CM | POA: Diagnosis not present

## 2017-10-14 DIAGNOSIS — I2510 Atherosclerotic heart disease of native coronary artery without angina pectoris: Secondary | ICD-10-CM

## 2017-10-14 DIAGNOSIS — Z801 Family history of malignant neoplasm of trachea, bronchus and lung: Secondary | ICD-10-CM

## 2017-10-14 DIAGNOSIS — Z85810 Personal history of malignant neoplasm of tongue: Secondary | ICD-10-CM | POA: Insufficient documentation

## 2017-10-14 DIAGNOSIS — Z95828 Presence of other vascular implants and grafts: Secondary | ICD-10-CM

## 2017-10-14 LAB — CMP (CANCER CENTER ONLY)
ALBUMIN: 4.2 g/dL (ref 3.5–5.0)
ALK PHOS: 61 U/L (ref 38–126)
ALT: 15 U/L (ref 0–44)
ANION GAP: 11 (ref 5–15)
AST: 21 U/L (ref 15–41)
BUN: 11 mg/dL (ref 8–23)
CALCIUM: 9.6 mg/dL (ref 8.9–10.3)
CO2: 25 mmol/L (ref 22–32)
Chloride: 103 mmol/L (ref 98–111)
Creatinine: 1.08 mg/dL (ref 0.61–1.24)
GFR, Est AFR Am: >60 mL/min (ref >60)
GFR, Estimated: >60 mL/min (ref >60)
GLUCOSE: 112 mg/dL — AB (ref 70–99)
POTASSIUM: 4.2 mmol/L (ref 3.5–5.1)
Sodium: 139 mmol/L (ref 135–145)
TOTAL PROTEIN: 7.4 g/dL (ref 6.5–8.1)
Total Bilirubin: 0.5 mg/dL (ref 0.3–1.2)

## 2017-10-14 LAB — CBC WITH DIFFERENTIAL (CANCER CENTER ONLY)
BASOS ABS: 0 10*3/uL (ref 0.0–0.1)
BASOS PCT: 1 %
Eosinophils Absolute: 0.2 10*3/uL (ref 0.0–0.5)
Eosinophils Relative: 4 %
HEMATOCRIT: 31.3 % — AB (ref 38.4–49.9)
HEMOGLOBIN: 10.6 g/dL — AB (ref 13.0–17.1)
Lymphocytes Relative: 24 %
Lymphs Abs: 0.8 10*3/uL — ABNORMAL LOW (ref 0.9–3.3)
MCH: 32.7 pg (ref 27.2–33.4)
MCHC: 33.7 g/dL (ref 32.0–36.0)
MCV: 96.9 fL (ref 79.3–98.0)
MONO ABS: 0.4 10*3/uL (ref 0.1–0.9)
MONOS PCT: 11 %
NEUTROS ABS: 2.1 10*3/uL (ref 1.5–6.5)
NEUTROS PCT: 60 %
Platelet Count: 123 10*3/uL — ABNORMAL LOW (ref 140–400)
RBC: 3.23 MIL/uL — ABNORMAL LOW (ref 4.20–5.82)
RDW: 18.5 % — ABNORMAL HIGH (ref 11.0–14.6)
WBC Count: 3.5 10*3/uL — ABNORMAL LOW (ref 4.0–10.3)

## 2017-10-14 LAB — MAGNESIUM: MAGNESIUM: 1.9 mg/dL (ref 1.7–2.4)

## 2017-10-14 MED ORDER — SODIUM CHLORIDE 0.9% FLUSH
10.0000 mL | INTRAVENOUS | Status: DC | PRN
  Administered 2017-10-14: 10 mL
  Filled 2017-10-14: qty 10

## 2017-10-14 MED ORDER — HEPARIN SOD (PORK) LOCK FLUSH 100 UNIT/ML IV SOLN
500.0000 [IU] | Freq: Once | INTRAVENOUS | Status: AC | PRN
  Administered 2017-10-14: 500 [IU]
  Filled 2017-10-14: qty 5

## 2017-10-14 NOTE — Progress Notes (Signed)
[place] OFFICE PROGRESS NOTE   Diagnosis: Cholangiocarcinoma   INTERVAL HISTORY:   Michael Mosley returns as scheduled.  He completed cycle 7 gemcitabine/cisplatin 09/29/2017.  He denies nausea/vomiting.  No mouth sores.  No diarrhea.  No numbness or tingling in his hands or feet.  No hearing loss.  He reports a good appetite.  No pain.  He reports partial vision loss involving the right eye, lower one half, on 10/10/2017.  He reports evaluation by ophthalmology yesterday with findings of a "blood clot" involving the eye.  He saw his cardiologist and was started on Xarelto.  The vision loss persists.  Objective:  Vital signs in last 24 hours:  Blood pressure 138/85, pulse 60, temperature 98 F (36.7 C), temperature source Oral, resp. rate 17, height _0  (1.854 m), weight 204 lb (92.5 kg), SpO2 98 %.    HEENT: No thrush or ulcers. Resp: Lungs clear bilaterally. Cardio: Regular rate and rhythm. GI: Abdomen soft and nontender.  No hepatomegaly. Vascular: No leg edema. Neuro: Alert and oriented. Skin: No rash. Port-A-Cath without erythema.   Lab Results:  Lab Results  Component Value Date   WBC 3.5 (L) 10/14/2017   HGB 10.6 (L) 10/14/2017   HCT 31.3 (L) 10/14/2017   MCV 96.9 10/14/2017   PLT 123 (L) 10/14/2017   NEUTROABS 2.1 10/14/2017    Imaging:  No results found.  Medications: I have reviewed the patient's current medications.  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   CT abdomen/pelvis on 04/21/2017-7 cm mass lesion in the lateral segment of the left liver associated with smaller lesions in the anterior right liver and medial lobe left liver. This was associated with necrotic lymphadenopathy in the gastrohepatic and hepatoduodenal ligaments.  04/25/2017 status post upper endoscopy and  colonoscopy.   On the upper endoscopy he was found to have 1 moderate benign-appearing intrinsic stenosis at 40 cm from the incisors. This was dilated. The exam of the esophagus was otherwise normal. The entire examined stomach and duodenum also normal.   On the colonoscopy he was found to have a 5 mm polyp in the cecum which was removed. Pathology showed a serrated polyp with features of a sessile serrated polyp; negative for cytologic dysplasia. He was noted to have many small and large mouth diverticula in the sigmoid colon and descending colon. Terminal ileum appeared normal. Internal hemorrhoids were found as well.   04/28/2017 status post biopsy of a liver lesion. Pathology showed adenocarcinoma positive for cytokeratin 7 (strong), CDX-2 (weak) and focal TTF-1. Cytokeratin 20 essentially negative.  CT chest 05/12/2017-no evidence of metastatic disease or a primary chest tumor  oundation1-microsatellite stable; tumor mutational burden 6 Muts/Mb; LAGT36 loss  Cycle 1 gemcitabine/cisplatin 07/01/2017  Cycle 2 gemcitabine/cisplatin 07/15/2017  Cycle 3 gemcitabine/cisplatin 07/29/2017  Cycle 4 gemcitabine/cisplatin 08/12/2017  Cycle 5 gemcitabine/cisplatin 08/26/2017  CT 09/15/2017- decreased size of dominant and smaller hepatic lesions, improved upper abdominal lymphadenopathy  Cycle 6 gemcitabine/cisplatin 09/16/2017  Cycle 7 gemcitabine/cisplatin 09/29/2017  Cycle 8 gemcitabine/cisplatin 10/15/2017 2. Weight loss-improved 3. 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 Alen Blew) 4. Asthma 5. Hypertension 6. Hyperlipidemia 7. Hypothyroid 8. Peripheral vascular disease 9.   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  He has completed 7 cycles of gemcitabine/cisplatin.  There is no clinical evidence of disease progression.  Plan  to proceed with cycle 8 as scheduled 10/15/2017.  He will return for lab, follow-up and cycle 9 gemcitabine/cisplatin in 2 weeks.  He will contact the office in the interim with any problems.  Patient seen with Dr. Benay Spice.    Ned Card ANP/GNP-BC   10/14/2017  10:24 AM  This was a shared visit with Ned Card.  Michael Mosley has developed right eye vision loss, likely secondary to an embolic event.  He has been evaluated by cardiology and ophthalmology.  He was placed on anticoagulation therapy.  The plan is to continue gemcitabine/cisplatin for the cholangiocarcinoma.  Julieanne Manson, MD

## 2017-10-15 ENCOUNTER — Other Ambulatory Visit: Payer: Self-pay | Admitting: Cardiology

## 2017-10-15 ENCOUNTER — Inpatient Hospital Stay: Payer: PPO

## 2017-10-15 VITALS — BP 124/75 | HR 61 | Temp 97.8°F | Resp 16

## 2017-10-15 DIAGNOSIS — H3411 Central retinal artery occlusion, right eye: Secondary | ICD-10-CM

## 2017-10-15 DIAGNOSIS — C221 Intrahepatic bile duct carcinoma: Secondary | ICD-10-CM

## 2017-10-15 DIAGNOSIS — Z5111 Encounter for antineoplastic chemotherapy: Secondary | ICD-10-CM | POA: Diagnosis not present

## 2017-10-15 MED ORDER — SODIUM CHLORIDE 0.9 % IV SOLN
Freq: Once | INTRAVENOUS | Status: AC
  Administered 2017-10-15: 12:00:00 via INTRAVENOUS
  Filled 2017-10-15: qty 5

## 2017-10-15 MED ORDER — POTASSIUM CHLORIDE 2 MEQ/ML IV SOLN
Freq: Once | INTRAVENOUS | Status: AC
  Administered 2017-10-15: 10:00:00 via INTRAVENOUS
  Filled 2017-10-15: qty 10

## 2017-10-15 MED ORDER — GEMCITABINE HCL CHEMO INJECTION 1 GM/26.3ML
800.0000 mg/m2 | Freq: Once | INTRAVENOUS | Status: AC
  Administered 2017-10-15: 1710 mg via INTRAVENOUS
  Filled 2017-10-15: qty 44.97

## 2017-10-15 MED ORDER — SODIUM CHLORIDE 0.9 % IV SOLN
Freq: Once | INTRAVENOUS | Status: AC
  Administered 2017-10-15: 10:00:00 via INTRAVENOUS
  Filled 2017-10-15: qty 250

## 2017-10-15 MED ORDER — SODIUM CHLORIDE 0.9% FLUSH
10.0000 mL | INTRAVENOUS | Status: DC | PRN
  Administered 2017-10-15: 10 mL
  Filled 2017-10-15: qty 10

## 2017-10-15 MED ORDER — SODIUM CHLORIDE 0.9 % IV SOLN
25.0000 mg/m2 | Freq: Once | INTRAVENOUS | Status: AC
  Administered 2017-10-15: 54 mg via INTRAVENOUS
  Filled 2017-10-15: qty 54

## 2017-10-15 MED ORDER — HEPARIN SOD (PORK) LOCK FLUSH 100 UNIT/ML IV SOLN
500.0000 [IU] | Freq: Once | INTRAVENOUS | Status: AC | PRN
  Administered 2017-10-15: 500 [IU]
  Filled 2017-10-15: qty 5

## 2017-10-15 MED ORDER — PALONOSETRON HCL INJECTION 0.25 MG/5ML
0.2500 mg | Freq: Once | INTRAVENOUS | Status: AC
  Administered 2017-10-15: 0.25 mg via INTRAVENOUS

## 2017-10-15 MED ORDER — PALONOSETRON HCL INJECTION 0.25 MG/5ML
INTRAVENOUS | Status: AC
  Filled 2017-10-15: qty 5

## 2017-10-15 NOTE — Patient Instructions (Signed)
[facility] Discharge Instructions for Patients Receiving Chemotherapy  Today you received the following chemotherapy agents Gemzar, Cisplatin  To help prevent nausea and vomiting after your treatment, we encourage you to take your nausea medication as directed   If you develop nausea and vomiting that is not controlled by your nausea medication, call the clinic.   BELOW ARE SYMPTOMS THAT SHOULD BE REPORTED IMMEDIATELY:  *FEVER GREATER THAN 100.5 F  *CHILLS WITH OR WITHOUT FEVER  NAUSEA AND VOMITING THAT IS NOT CONTROLLED WITH YOUR NAUSEA MEDICATION  *UNUSUAL SHORTNESS OF BREATH  *UNUSUAL BRUISING OR BLEEDING  TENDERNESS IN MOUTH AND THROAT WITH OR WITHOUT PRESENCE OF ULCERS  *URINARY PROBLEMS  *BOWEL PROBLEMS  UNUSUAL RASH Items with * indicate a potential emergency and should be followed up as soon as possible.  Feel free to call the clinic should you have any questions or concerns. The clinic phone number is [phone].  Please show the CHEMO ALERT CARD at check-in to the Emergency Department and triage nurse.   

## 2017-10-16 ENCOUNTER — Telehealth: Payer: Self-pay | Admitting: Oncology

## 2017-10-16 LAB — CANCER ANTIGEN 19-9: CAN 19-9: 47 U/mL — AB (ref 0–35)

## 2017-10-16 NOTE — Telephone Encounter (Signed)
Patient called to change 9/4 appointments because they are going to be away at the beach.  Gave patient to nurse to talk about when it is appropriate to reschedule his appts/infusion.

## 2017-10-17 ENCOUNTER — Other Ambulatory Visit: Payer: Self-pay

## 2017-10-17 DIAGNOSIS — I6523 Occlusion and stenosis of bilateral carotid arteries: Secondary | ICD-10-CM

## 2017-10-21 ENCOUNTER — Telehealth: Payer: Self-pay | Admitting: *Deleted

## 2017-10-21 ENCOUNTER — Telehealth: Payer: Self-pay | Admitting: Oncology

## 2017-10-21 NOTE — Telephone Encounter (Signed)
[id number]  Received call from patient to reschedule appts for 8/22 and 8/23 to the following week. Pt will be out of town. Scheduling message sent and transferred call to scheduling dept.

## 2017-10-21 NOTE — Telephone Encounter (Signed)
Called regarding 8/28 also mailed a letter

## 2017-10-24 ENCOUNTER — Ambulatory Visit
Admission: RE | Admit: 2017-10-24 | Discharge: 2017-10-24 | Disposition: A | Discharge status: Home / Self Care | Payer: PPO | Source: Ambulatory Visit | Attending: Cardiology | Admitting: Cardiology

## 2017-10-24 DIAGNOSIS — H3411 Central retinal artery occlusion, right eye: Secondary | ICD-10-CM

## 2017-10-24 DIAGNOSIS — I6523 Occlusion and stenosis of bilateral carotid arteries: Secondary | ICD-10-CM | POA: Diagnosis not present

## 2017-10-24 MED ORDER — IOPAMIDOL (ISOVUE-370) INJECTION 76%
75.0000 mL | Freq: Once | INTRAVENOUS | Status: AC | PRN
  Administered 2017-10-24: 75 mL via INTRAVENOUS

## 2017-10-29 ENCOUNTER — Other Ambulatory Visit: Payer: PPO

## 2017-10-29 ENCOUNTER — Ambulatory Visit: Payer: PPO

## 2017-10-29 ENCOUNTER — Ambulatory Visit: Payer: PPO | Admitting: Oncology

## 2017-11-03 ENCOUNTER — Encounter: Payer: Self-pay | Admitting: *Deleted

## 2017-11-03 ENCOUNTER — Other Ambulatory Visit: Payer: Self-pay

## 2017-11-03 ENCOUNTER — Ambulatory Visit (INDEPENDENT_AMBULATORY_CARE_PROVIDER_SITE_OTHER): Payer: PPO | Admitting: Vascular Surgery

## 2017-11-03 ENCOUNTER — Encounter: Payer: Self-pay | Admitting: Vascular Surgery

## 2017-11-03 ENCOUNTER — Ambulatory Visit (HOSPITAL_COMMUNITY)
Admission: RE | Admit: 2017-11-03 | Discharge: 2017-11-03 | Disposition: A | Discharge status: Home / Self Care | Payer: PPO | Source: Ambulatory Visit | Attending: Vascular Surgery | Admitting: Vascular Surgery

## 2017-11-03 VITALS — BP 128/82 | HR 65 | Temp 97.0°F | Resp 18 | Ht 73.0 in | Wt 202.0 lb

## 2017-11-03 DIAGNOSIS — I6523 Occlusion and stenosis of bilateral carotid arteries: Secondary | ICD-10-CM

## 2017-11-03 DIAGNOSIS — I10 Essential (primary) hypertension: Secondary | ICD-10-CM | POA: Diagnosis not present

## 2017-11-03 DIAGNOSIS — I350 Nonrheumatic aortic (valve) stenosis: Secondary | ICD-10-CM | POA: Diagnosis not present

## 2017-11-03 DIAGNOSIS — I2510 Atherosclerotic heart disease of native coronary artery without angina pectoris: Secondary | ICD-10-CM | POA: Diagnosis not present

## 2017-11-03 NOTE — Progress Notes (Signed)
Patient ID: MONTERIO BOB, male   DOB: 09/13/1950, 67 y.o.   MRN: [id number]  Reason for Consult: New Patient (Initial Visit) (carotid stenosis)   Referred by Adrian Prows, MD  Subjective:     HPI:  Michael Mosley is a 67 y.o. male history of head neck cancer treated 14 years ago.  He now has cholangiocarcinoma is been undergoing chemotherapy but maintains good strength has not had any adverse effects from this.  He has sent for evaluation of bilateral carotid artery stenosis.  He had CTA in duplex performed prior to today's visit.  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  He also takes aspirin.  He has not had any frank stroke TIA or amaurosis.  He continues to be very active with his only limitation as hip pain.  He has no other complaints related to today's visit.  His residual deficit from his retinal artery occlusion is haziness in his inferior visual fields on the right only.  Past Medical History:  Diagnosis Date  . Adenocarcinoma determined by biopsy of liver ([place]) 04/28/2017  . Asthma   . Cancer ([place])    tongue cancer s/p chemo/RXT, followed by Dr Sondra Come.   Marland Kitchen Heart murmur   . History of radiation therapy 02/2005   CTV 6810 cGy, PTV 6000 cGy, ETV 227 cGy  . Hyperlipidemia   . Hypertension   . Thyroid disease   . Umbilical hernia    History reviewed. No pertinent family history. Past Surgical History:  Procedure Laterality Date  . IR FLUORO GUIDE PORT INSERTION RIGHT  06/23/2017  . IR US GUIDE VASC ACCESS RIGHT  06/23/2017  . SHOULDER SURGERY Right     Short Social History:  Social History   Tobacco Use  . Smoking status: Never Smoker  . Smokeless tobacco: Never Used  Substance Use Topics  . Alcohol use: Yes    Comment: rarely    Allergies  Allergen Reactions  . Ascorbate Other (See Comments)    Gets cold symptoms  . Vitamin C Other (See Comments)    Gets cold symptoms Gets cold symptoms  . Albuterol Sulfate Palpitations  .  Hydrocodone Nausea And Vomiting and Nausea Only    Current Outpatient Medications  Medication Sig Dispense Refill  . ALPRAZolam (XANAX) 0.5 MG tablet Take 1 tablet (0.5 mg total) by mouth 2 (two) times daily as needed for anxiety. And insomnia. Do not drive while taking. 60 tablet 1  . levothyroxine (SYNTHROID, LEVOTHROID) 75 MCG tablet Take 1 tablet (75 mcg total) by mouth daily. 90 tablet 3  . lidocaine-prilocaine (EMLA) cream Apply to portacath site 1-2 hours prior to use 30 g 2  . ramipril (ALTACE) 2.5 MG capsule Take 2.5 mg by mouth daily.    . rosuvastatin (CRESTOR) 40 MG tablet Take 40 mg by mouth daily.  2  . Turmeric 500 MG CAPS Take by mouth.    Alveda Reasons 20 MG TABS tablet     . gabapentin (NEURONTIN) 300 MG capsule   3  . ibuprofen (ADVIL,MOTRIN) 200 MG tablet Take 400 mg by mouth every 6 (six) hours as needed.    . ondansetron (ZOFRAN ODT) 4 MG disintegrating tablet 4mg  ODT q4 hours prn nausea/vomit (Patient not taking: Reported on 10/14/2017) 10 tablet 0  . ondansetron (ZOFRAN) 8 MG tablet Take 1 tablet (8 mg total) by mouth every 8 (eight) hours as needed for nausea or vomiting. (Patient not taking: Reported on 10/14/2017) 30  tablet 2   Current Facility-Administered Medications  Medication Dose Route Frequency Provider Last Rate Last Dose  . 0.9 %  sodium chloride infusion  500 mL Intravenous Once Pyrtle, Lajuan Lines, MD       Facility-Administered Medications Ordered in Other Visits  Medication Dose Route Frequency Provider Last Rate Last Dose  . sodium chloride 0.9 % injection 10 mL  10 mL Intracatheter Once Ladell Pier, MD        Review of Systems  Constitutional:  Constitutional negative. HENT: HENT negative.  Eyes: Positive for loss of vision.   Respiratory: Respiratory negative.  Cardiovascular: Cardiovascular negative.  GI: Gastrointestinal negative.  Musculoskeletal: Musculoskeletal negative.  Skin: Skin negative.  Neurological: Neurological negative. Hematologic:  Hematologic/lymphatic negative.  Psychiatric: Psychiatric negative.        Objective:  Objective   Vitals:   11/03/17 1458 11/03/17 1503  BP: 117/81 128/82  Pulse: 65 65  Resp: 18   Temp: (!) 97 F (36.1 C)   TempSrc: Oral   SpO2: 98%   Weight: 202 lb (91.6 kg)   Height: 6\' 1"  (1.854 m)    Body mass index is 26.65 kg/m.  Physical Exam  Constitutional: He is oriented to person, place, and time. He appears well-developed.  HENT:  Head: Normocephalic.  Eyes: Pupils are equal, round, and reactive to light.  Cardiovascular: Normal rate.  Pulses:      Radial pulses are 2+ on the right side, and 2+ on the left side.       Femoral pulses are 2+ on the right side, and 2+ on the left side. No bruits detected bilaterally  Pulmonary/Chest: Effort normal.  Abdominal: Soft.  Musculoskeletal: Normal range of motion. He exhibits no edema.  Neurological: He is alert and oriented to person, place, and time.  Skin: Skin is warm and dry.  Psychiatric: He has a normal mood and affect.    Data: IMPRESSION: 75% diameter stenosis right internal carotid artery unchanged. 50% diameter stenosis distal right common carotid artery unchanged  75% diameter stenosis left internal carotid artery unchanged from the prior study  I reviewed his CT scan with the patient and his son demonstrating the high-grade bilateral internal carotid artery stenosis that appears mostly to be soft plaque and has string signs bilaterally.  Next  I have also independently interpreted his bilateral carotid artery duplex which demonstrates 80- 99% stenosis bilaterally.  The lesions do appear high by duplex.  In reconciling this with the CT scan the right side does appear high but with a very light bifurcation is still below the jawline.  Assessment/Plan:     67 year old male with bilateral high-grade carotid stenosis.  He is right-hand dominant.  He has demonstrated possible symptomatology on the right with central  retinal artery occlusion currently maintained on Xarelto.  We will plan for right carotid endarterectomy in the near future.  I discussed with him the risk and benefits including the risk of stroke a 2%, risk to his cranial nerves, bleeding.  He will need to be off of Xarelto for 48 hours prior to procedure.     Waynetta Sandy MD [facility]

## 2017-11-04 ENCOUNTER — Other Ambulatory Visit: Payer: Self-pay | Admitting: *Deleted

## 2017-11-05 ENCOUNTER — Encounter: Payer: Self-pay | Admitting: Cardiology

## 2017-11-05 ENCOUNTER — Inpatient Hospital Stay (HOSPITAL_BASED_OUTPATIENT_CLINIC_OR_DEPARTMENT_OTHER): Payer: PPO | Admitting: Oncology

## 2017-11-05 ENCOUNTER — Encounter: Payer: Self-pay | Admitting: Oncology

## 2017-11-05 ENCOUNTER — Inpatient Hospital Stay: Payer: PPO

## 2017-11-05 VITALS — BP 126/75 | HR 63 | Temp 97.7°F | Resp 17 | Ht 73.0 in | Wt 209.8 lb

## 2017-11-05 DIAGNOSIS — I10 Essential (primary) hypertension: Secondary | ICD-10-CM | POA: Diagnosis not present

## 2017-11-05 DIAGNOSIS — Z86010 Personal history of colonic polyps: Secondary | ICD-10-CM | POA: Diagnosis not present

## 2017-11-05 DIAGNOSIS — H5461 Unqualified visual loss, right eye, normal vision left eye: Secondary | ICD-10-CM

## 2017-11-05 DIAGNOSIS — I739 Peripheral vascular disease, unspecified: Secondary | ICD-10-CM | POA: Diagnosis not present

## 2017-11-05 DIAGNOSIS — J45909 Unspecified asthma, uncomplicated: Secondary | ICD-10-CM

## 2017-11-05 DIAGNOSIS — Z7901 Long term (current) use of anticoagulants: Secondary | ICD-10-CM

## 2017-11-05 DIAGNOSIS — E039 Hypothyroidism, unspecified: Secondary | ICD-10-CM | POA: Diagnosis not present

## 2017-11-05 DIAGNOSIS — C221 Intrahepatic bile duct carcinoma: Secondary | ICD-10-CM

## 2017-11-05 DIAGNOSIS — E785 Hyperlipidemia, unspecified: Secondary | ICD-10-CM

## 2017-11-05 DIAGNOSIS — Z85810 Personal history of malignant neoplasm of tongue: Secondary | ICD-10-CM

## 2017-11-05 DIAGNOSIS — C229 Malignant neoplasm of liver, not specified as primary or secondary: Secondary | ICD-10-CM

## 2017-11-05 DIAGNOSIS — Z5111 Encounter for antineoplastic chemotherapy: Secondary | ICD-10-CM

## 2017-11-05 LAB — CMP (CANCER CENTER ONLY)
ALK PHOS: 64 U/L (ref 38–126)
ALT: 16 U/L (ref 0–44)
AST: 20 U/L (ref 15–41)
Albumin: 4 g/dL (ref 3.5–5.0)
Anion gap: 8 (ref 5–15)
BUN: 16 mg/dL (ref 8–23)
CALCIUM: 9.5 mg/dL (ref 8.9–10.3)
CO2: 28 mmol/L (ref 22–32)
CREATININE: 1.09 mg/dL (ref 0.61–1.24)
Chloride: 103 mmol/L (ref 98–111)
GFR, Est AFR Am: >60 mL/min (ref >60)
Glucose, Bld: 125 mg/dL — ABNORMAL HIGH (ref 70–99)
POTASSIUM: 4.1 mmol/L (ref 3.5–5.1)
Sodium: 139 mmol/L (ref 135–145)
Total Bilirubin: 0.3 mg/dL (ref 0.3–1.2)
Total Protein: 7.2 g/dL (ref 6.5–8.1)

## 2017-11-05 LAB — CBC WITH DIFFERENTIAL (CANCER CENTER ONLY)
BASOS PCT: 2 %
Basophils Absolute: 0.1 10*3/uL (ref 0.0–0.1)
EOS ABS: 0.3 10*3/uL (ref 0.0–0.5)
Eosinophils Relative: 8 %
HCT: 31.6 % — ABNORMAL LOW (ref 38.4–49.9)
Hemoglobin: 10.6 g/dL — ABNORMAL LOW (ref 13.0–17.1)
Lymphocytes Relative: 29 %
Lymphs Abs: 1.1 10*3/uL (ref 0.9–3.3)
MCH: 33 pg (ref 27.2–33.4)
MCHC: 33.7 g/dL (ref 32.0–36.0)
MCV: 97.8 fL (ref 79.3–98.0)
Monocytes Absolute: 0.4 10*3/uL (ref 0.1–0.9)
Monocytes Relative: 10 %
Neutro Abs: 2 10*3/uL (ref 1.5–6.5)
Neutrophils Relative %: 51 %
PLATELETS: 182 10*3/uL (ref 140–400)
RBC: 3.23 MIL/uL — ABNORMAL LOW (ref 4.20–5.82)
RDW: 16.8 % — AB (ref 11.0–14.6)
WBC Count: 3.9 10*3/uL — ABNORMAL LOW (ref 4.0–10.3)

## 2017-11-05 LAB — MAGNESIUM: Magnesium: 1.9 mg/dL (ref 1.7–2.4)

## 2017-11-05 MED ORDER — PALONOSETRON HCL INJECTION 0.25 MG/5ML
INTRAVENOUS | Status: AC
  Filled 2017-11-05: qty 5

## 2017-11-05 MED ORDER — HEPARIN SOD (PORK) LOCK FLUSH 100 UNIT/ML IV SOLN
500.0000 [IU] | Freq: Once | INTRAVENOUS | Status: AC | PRN
  Administered 2017-11-05: 500 [IU]
  Filled 2017-11-05: qty 5

## 2017-11-05 MED ORDER — SODIUM CHLORIDE 0.9 % IV SOLN: 800.0000 mg/m2 | Freq: Once | INTRAVENOUS | Status: DC

## 2017-11-05 MED ORDER — PALONOSETRON HCL INJECTION 0.25 MG/5ML
0.2500 mg | Freq: Once | INTRAVENOUS | Status: AC
  Administered 2017-11-05: 0.25 mg via INTRAVENOUS

## 2017-11-05 MED ORDER — SODIUM CHLORIDE 0.9 % IV SOLN
Freq: Once | INTRAVENOUS | Status: AC
  Administered 2017-11-05: 12:00:00 via INTRAVENOUS
  Filled 2017-11-05: qty 5

## 2017-11-05 MED ORDER — SODIUM CHLORIDE 0.9% FLUSH
10.0000 mL | INTRAVENOUS | Status: DC | PRN
  Administered 2017-11-05: 10 mL
  Filled 2017-11-05: qty 10

## 2017-11-05 MED ORDER — SODIUM CHLORIDE 0.9 % IV SOLN
25.0000 mg/m2 | Freq: Once | INTRAVENOUS | Status: AC
  Administered 2017-11-05: 54 mg via INTRAVENOUS
  Filled 2017-11-05: qty 54

## 2017-11-05 MED ORDER — SODIUM CHLORIDE 0.9 % IV SOLN
Freq: Once | INTRAVENOUS | Status: AC
  Administered 2017-11-05: 09:00:00 via INTRAVENOUS
  Filled 2017-11-05: qty 250

## 2017-11-05 MED ORDER — POTASSIUM CHLORIDE 2 MEQ/ML IV SOLN
Freq: Once | INTRAVENOUS | Status: AC
  Administered 2017-11-05: 10:00:00 via INTRAVENOUS
  Filled 2017-11-05: qty 10

## 2017-11-05 MED ORDER — ALPRAZOLAM 0.5 MG PO TABS: 0.5000 mg | ORAL_TABLET | Freq: Every evening | ORAL | 1 refills | Status: AC | PRN

## 2017-11-05 MED ORDER — SODIUM CHLORIDE 0.9 % IV SOLN
800.0000 mg/m2 | Freq: Once | INTRAVENOUS | Status: AC
  Administered 2017-11-05: 1710 mg via INTRAVENOUS
  Filled 2017-11-05: qty 44.97

## 2017-11-05 NOTE — Progress Notes (Signed)
[facility] OFFICE PROGRESS NOTE   Diagnosis: Cholangiocarcinoma  INTERVAL HISTORY:   Michael Mosley returns as scheduled.  He feels well.  No pain.  He completed another cycle of gemcitabine/cisplatin on 10/15/2017.  No nausea or neuropathy symptoms.  He reports stable vision loss in the right eye.  He was evaluated by Michael Mosley and has been scheduled for a right carotid endarterectomy.  Objective:  Vital signs in last 24 hours:  Blood pressure 126/75, pulse 63, temperature 97.7 F (36.5 C), temperature source Oral, resp. rate 17, height _0  (1.854 m), weight 209 lb 12.8 oz (95.2 kg), SpO2 100 %.    HEENT: No thrush or ulcers Resp: Lungs clear bilaterally Cardio: Regular rate and rhythm, 2/6 systolic murmur GI: No hepatomegaly, nontender, no mass Vascular: No leg edema  Portacath/PICC-without erythema  Lab Results:  Lab Results  Component Value Date   WBC 3.9 (L) 11/05/2017   HGB 10.6 (L) 11/05/2017   HCT 31.6 (L) 11/05/2017   MCV 97.8 11/05/2017   PLT 182 11/05/2017   NEUTROABS 2.0 11/05/2017    CMP  Lab Results  Component Value Date   NA 139 10/14/2017   K 4.2 10/14/2017   CL 103 10/14/2017   CO2 25 10/14/2017   GLUCOSE 112 (H) 10/14/2017   BUN 11 10/14/2017   CREATININE 1.08 10/14/2017   CALCIUM 9.6 10/14/2017   PROT 7.4 10/14/2017   ALBUMIN 4.2 10/14/2017   AST 21 10/14/2017   ALT 15 10/14/2017   ALKPHOS 61 10/14/2017   BILITOT 0.5 10/14/2017   GFRNONAA >60 10/14/2017   GFRAA >60 10/14/2017    Lab Results  Component Value Date   CEA1 3.50 05/09/2017    Lab Results  Component Value Date   INR 1.01 06/23/2017    Imaging:  No results found.  Medications: I have reviewed the patient's current medications.   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   CT abdomen/pelvis  on 04/21/2017-7 cm mass lesion in the lateral segment of the left liver associated with smaller lesions in the anterior right liver and medial lobe left liver. This was associated with necrotic lymphadenopathy in the gastrohepatic and hepatoduodenal ligaments.  04/25/2017 status post upper endoscopy and colonoscopy.   On the upper endoscopy he was found to have 1 moderate benign-appearing intrinsic stenosis at 40 cm from the incisors. This was dilated. The exam of the esophagus was otherwise normal. The entire examined stomach and duodenum also normal.   On the colonoscopy he was found to have a 5 mm polyp in the cecum which was removed. Pathology showed a serrated polyp with features of a sessile serrated polyp; negative for cytologic dysplasia. He was noted to have many small and large mouth diverticula in the sigmoid colon and descending colon. Terminal ileum appeared normal. Internal hemorrhoids were found as well.   04/28/2017 status post biopsy of a liver lesion. Pathology showed adenocarcinoma positive for cytokeratin 7 (strong), CDX-2 (weak) and focal TTF-1. Cytokeratin 20 essentially negative.  CT chest 05/12/2017-no evidence of metastatic disease or a primary chest tumor  oundation1-microsatellite stable; tumor mutational burden 6 Muts/Mb; FTDD22 loss  Cycle 1 gemcitabine/cisplatin 07/01/2017  Cycle 2 gemcitabine/cisplatin 07/15/2017  Cycle 3 gemcitabine/cisplatin 07/29/2017  Cycle 4 gemcitabine/cisplatin 08/12/2017  Cycle 5 gemcitabine/cisplatin 08/26/2017  CT 09/15/2017- decreased size of dominant and smaller hepatic lesions, improved upper abdominal lymphadenopathy  Cycle 6 gemcitabine/cisplatin 09/16/2017  Cycle 7 gemcitabine/cisplatin 09/29/2017  Cycle 8 gemcitabine/cisplatin 10/15/2017  Cycle 9 gemcitabine/cisplatin 11/05/2017 2. Weight loss-improved 3. 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 Asthma 5. Hypertension 6. Hyperlipidemia 7. Hypothyroid 8. Peripheral vascular disease 9.   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  Will complete another cycle of gemcitabine/cisplatin today.  He will return for an office visit and chemotherapy in 2 weeks.  He will be scheduled for restaging CT at a 38-monthinterval.  15 minutes were spent with the patient today.  The majority of the time was used for counseling and coordination of care.  GBetsy Coder MD  11/05/2017  8:56 AM

## 2017-11-05 NOTE — Patient Instructions (Signed)
Implanted Port Home Guide An implanted port is a type of central line that is placed under the skin. Central lines are used to provide IV access when treatment or nutrition needs to be given through a person's veins. Implanted ports are used for long-term IV access. An implanted port may be placed because:  You need IV medicine that would be irritating to the small veins in your hands or arms.  You need long-term IV medicines, such as antibiotics.  You need IV nutrition for a long period.  You need frequent blood draws for lab tests.  You need dialysis.  Implanted ports are usually placed in the chest area, but they can also be placed in the upper arm, the abdomen, or the leg. An implanted port has two main parts:  Reservoir. The reservoir is round and will appear as a small, raised area under your skin. The reservoir is the part where a needle is inserted to give medicines or draw blood.  Catheter. The catheter is a thin, flexible tube that extends from the reservoir. The catheter is placed into a large vein. Medicine that is inserted into the reservoir goes into the catheter and then into the vein.  How will I care for my incision site? Do not get the incision site wet. Bathe or shower as directed by your health care provider. How is my port accessed? Special steps must be taken to access the port:  Before the port is accessed, a numbing cream can be placed on the skin. This helps numb the skin over the port site.  Your health care provider uses a sterile technique to access the port. ? Your health care provider must put on a mask and sterile gloves. ? The skin over your port is cleaned carefully with an antiseptic and allowed to dry. ? The port is gently pinched between sterile gloves, and a needle is inserted into the port.  Only "non-coring" port needles should be used to access the port. Once the port is accessed, a blood return should be checked. This helps ensure that the port  is in the vein and is not clogged.  If your port needs to remain accessed for a constant infusion, a clear (transparent) bandage will be placed over the needle site. The bandage and needle will need to be changed every week, or as directed by your health care provider.  Keep the bandage covering the needle clean and dry. Do not get it wet. Follow your health care provider's instructions on how to take a shower or bath while the port is accessed.  If your port does not need to stay accessed, no bandage is needed over the port.  What is flushing? Flushing helps keep the port from getting clogged. Follow your health care provider's instructions on how and when to flush the port. Ports are usually flushed with saline solution or a medicine called heparin. The need for flushing will depend on how the port is used.  If the port is used for intermittent medicines or blood draws, the port will need to be flushed: ? After medicines have been given. ? After blood has been drawn. ? As part of routine maintenance.  If a constant infusion is running, the port may not need to be flushed.  How long will my port stay implanted? The port can stay in for as long as your health care provider thinks it is needed. When it is time for the port to come out, surgery will be   done to remove it. The procedure is similar to the one performed when the port was put in. When should I seek immediate medical care? When you have an implanted port, you should seek immediate medical care if:  You notice a bad smell coming from the incision site.  You have swelling, redness, or drainage at the incision site.  You have more swelling or pain at the port site or the surrounding area.  You have a fever that is not controlled with medicine.  This information is not intended to replace advice given to you by your health care provider. Make sure you discuss any questions you have with your health care provider. Document  Released: 02/25/2005 Document Revised: 08/03/2015 Document Reviewed: 11/02/2012 Elsevier Interactive Patient Education  2017 [employer].  

## 2017-11-05 NOTE — Patient Instructions (Signed)
[facility] Discharge Instructions for Patients Receiving Chemotherapy  Today you received the following chemotherapy agents Gemzar, Cisplatin  To help prevent nausea and vomiting after your treatment, we encourage you to take your nausea medication as directed   If you develop nausea and vomiting that is not controlled by your nausea medication, call the clinic.   BELOW ARE SYMPTOMS THAT SHOULD BE REPORTED IMMEDIATELY:  *FEVER GREATER THAN 100.5 F  *CHILLS WITH OR WITHOUT FEVER  NAUSEA AND VOMITING THAT IS NOT CONTROLLED WITH YOUR NAUSEA MEDICATION  *UNUSUAL SHORTNESS OF BREATH  *UNUSUAL BRUISING OR BLEEDING  TENDERNESS IN MOUTH AND THROAT WITH OR WITHOUT PRESENCE OF ULCERS  *URINARY PROBLEMS  *BOWEL PROBLEMS  UNUSUAL RASH Items with * indicate a potential emergency and should be followed up as soon as possible.  Feel free to call the clinic should you have any questions or concerns. The clinic phone number is [phone].  Please show the CHEMO ALERT CARD at check-in to the Emergency Department and triage nurse.   

## 2017-11-06 ENCOUNTER — Telehealth: Payer: Self-pay | Admitting: Oncology

## 2017-11-06 NOTE — Telephone Encounter (Signed)
[place] appt per 8/28 los - unable to schedule appts for 9/11 due to availability - logged  will call patient when appts scheduled.

## 2017-11-07 ENCOUNTER — Encounter: Payer: Self-pay | Admitting: Cardiology

## 2017-11-12 ENCOUNTER — Ambulatory Visit: Payer: PPO

## 2017-11-12 ENCOUNTER — Ambulatory Visit: Payer: PPO | Admitting: Oncology

## 2017-11-12 ENCOUNTER — Other Ambulatory Visit: Payer: PPO

## 2017-11-12 ENCOUNTER — Telehealth: Payer: Self-pay | Admitting: Oncology

## 2017-11-12 NOTE — Telephone Encounter (Signed)
Left message for patient per 8/28 los. Scheduled appt for 9/11

## 2017-11-16 ENCOUNTER — Other Ambulatory Visit: Payer: Self-pay | Admitting: Oncology

## 2017-11-18 DIAGNOSIS — I25118 Atherosclerotic heart disease of native coronary artery with other forms of angina pectoris: Secondary | ICD-10-CM | POA: Diagnosis not present

## 2017-11-18 DIAGNOSIS — H3411 Central retinal artery occlusion, right eye: Secondary | ICD-10-CM | POA: Diagnosis not present

## 2017-11-18 DIAGNOSIS — C220 Liver cell carcinoma: Secondary | ICD-10-CM | POA: Diagnosis not present

## 2017-11-18 DIAGNOSIS — I6523 Occlusion and stenosis of bilateral carotid arteries: Secondary | ICD-10-CM | POA: Diagnosis not present

## 2017-11-19 ENCOUNTER — Inpatient Hospital Stay: Payer: PPO

## 2017-11-19 ENCOUNTER — Inpatient Hospital Stay: Payer: PPO | Attending: Oncology | Admitting: Oncology

## 2017-11-19 ENCOUNTER — Telehealth: Payer: Self-pay | Admitting: Oncology

## 2017-11-19 ENCOUNTER — Encounter: Payer: Self-pay | Admitting: Oncology

## 2017-11-19 VITALS — BP 123/84 | HR 57 | Resp 18 | Ht 73.0 in | Wt 213.0 lb

## 2017-11-19 DIAGNOSIS — E785 Hyperlipidemia, unspecified: Secondary | ICD-10-CM | POA: Diagnosis not present

## 2017-11-19 DIAGNOSIS — Z86010 Personal history of colonic polyps: Secondary | ICD-10-CM | POA: Diagnosis not present

## 2017-11-19 DIAGNOSIS — Z9221 Personal history of antineoplastic chemotherapy: Secondary | ICD-10-CM | POA: Insufficient documentation

## 2017-11-19 DIAGNOSIS — Z85810 Personal history of malignant neoplasm of tongue: Secondary | ICD-10-CM | POA: Diagnosis not present

## 2017-11-19 DIAGNOSIS — H5461 Unqualified visual loss, right eye, normal vision left eye: Secondary | ICD-10-CM | POA: Diagnosis not present

## 2017-11-19 DIAGNOSIS — Z923 Personal history of irradiation: Secondary | ICD-10-CM | POA: Insufficient documentation

## 2017-11-19 DIAGNOSIS — I10 Essential (primary) hypertension: Secondary | ICD-10-CM | POA: Diagnosis not present

## 2017-11-19 DIAGNOSIS — I739 Peripheral vascular disease, unspecified: Secondary | ICD-10-CM | POA: Diagnosis not present

## 2017-11-19 DIAGNOSIS — C221 Intrahepatic bile duct carcinoma: Secondary | ICD-10-CM

## 2017-11-19 DIAGNOSIS — J45909 Unspecified asthma, uncomplicated: Secondary | ICD-10-CM | POA: Diagnosis not present

## 2017-11-19 DIAGNOSIS — R634 Abnormal weight loss: Secondary | ICD-10-CM | POA: Diagnosis not present

## 2017-11-19 DIAGNOSIS — E039 Hypothyroidism, unspecified: Secondary | ICD-10-CM | POA: Insufficient documentation

## 2017-11-19 DIAGNOSIS — D696 Thrombocytopenia, unspecified: Secondary | ICD-10-CM | POA: Diagnosis not present

## 2017-11-19 LAB — CBC WITH DIFFERENTIAL (CANCER CENTER ONLY)
Basophils Absolute: 0 10*3/uL (ref 0.0–0.1)
Basophils Relative: 1 %
EOS ABS: 0.3 10*3/uL (ref 0.0–0.5)
Eosinophils Relative: 6 %
HEMATOCRIT: 32.6 % — AB (ref 38.4–49.9)
HEMOGLOBIN: 10.7 g/dL — AB (ref 13.0–17.1)
LYMPHS ABS: 0.9 10*3/uL (ref 0.9–3.3)
Lymphocytes Relative: 18 %
MCH: 32.4 pg (ref 27.2–33.4)
MCHC: 32.8 g/dL (ref 32.0–36.0)
MCV: 98.8 fL — ABNORMAL HIGH (ref 79.3–98.0)
MONO ABS: 0.5 10*3/uL (ref 0.1–0.9)
MONOS PCT: 10 %
NEUTROS ABS: 3.3 10*3/uL (ref 1.5–6.5)
NEUTROS PCT: 65 %
Platelet Count: 90 10*3/uL — ABNORMAL LOW (ref 140–400)
RBC: 3.3 MIL/uL — ABNORMAL LOW (ref 4.20–5.82)
RDW: 15.1 % — ABNORMAL HIGH (ref 11.0–14.6)
WBC Count: 5.1 10*3/uL (ref 4.0–10.3)

## 2017-11-19 LAB — CMP (CANCER CENTER ONLY)
ALK PHOS: 72 U/L (ref 38–126)
ALT: 9 U/L (ref 0–44)
ANION GAP: 10 (ref 5–15)
AST: 17 U/L (ref 15–41)
Albumin: 4 g/dL (ref 3.5–5.0)
BILIRUBIN TOTAL: 0.3 mg/dL (ref 0.3–1.2)
BUN: 16 mg/dL (ref 8–23)
CALCIUM: 9.6 mg/dL (ref 8.9–10.3)
CO2: 27 mmol/L (ref 22–32)
Chloride: 102 mmol/L (ref 98–111)
Creatinine: 1.13 mg/dL (ref 0.61–1.24)
GFR, Estimated: >60 mL/min (ref >60)
Glucose, Bld: 110 mg/dL — ABNORMAL HIGH (ref 70–99)
Potassium: 4.2 mmol/L (ref 3.5–5.1)
Sodium: 139 mmol/L (ref 135–145)
TOTAL PROTEIN: 7.2 g/dL (ref 6.5–8.1)

## 2017-11-19 LAB — MAGNESIUM: MAGNESIUM: 1.8 mg/dL (ref 1.7–2.4)

## 2017-11-19 NOTE — Telephone Encounter (Signed)
Gave pt avs and calendar  °

## 2017-11-19 NOTE — Progress Notes (Signed)
[facility] OFFICE PROGRESS NOTE   Diagnosis: Cholangiocarcinoma  INTERVAL HISTORY:   Mr. Michael Mosley returns as scheduled.  He completed another cycle of gemcitabine/cisplatin on 11/05/2017.  No nausea or neuropathy symptoms.  No pain.  Stable right eye vision loss.  No new complaint.  He is scheduled for carotid endarterectomy next week.  Objective:  Vital signs in last 24 hours:  There were no vitals taken for this visit.    HEENT: No thrush or ulcers Resp: Lungs clear bilaterally Cardio: Regular rate and rhythm GI: No hepatomegaly, no mass, nontender Vascular: No leg edema  Portacath/PICC-without erythema  Lab Results:  Lab Results  Component Value Date   WBC 3.9 (L) 11/05/2017   HGB 10.6 (L) 11/05/2017   HCT 31.6 (L) 11/05/2017   MCV 97.8 11/05/2017   PLT 182 11/05/2017   NEUTROABS 2.0 11/05/2017    CMP  Lab Results  Component Value Date   NA 139 11/05/2017   K 4.1 11/05/2017   CL 103 11/05/2017   CO2 28 11/05/2017   GLUCOSE 125 (H) 11/05/2017   BUN 16 11/05/2017   CREATININE 1.09 11/05/2017   CALCIUM 9.5 11/05/2017   PROT 7.2 11/05/2017   ALBUMIN 4.0 11/05/2017   AST 20 11/05/2017   ALT 16 11/05/2017   ALKPHOS 64 11/05/2017   BILITOT 0.3 11/05/2017   GFRNONAA >60 11/05/2017   GFRAA >60 11/05/2017    Lab Results  Component Value Date   CEA1 3.50 05/09/2017    Medications: I have reviewed the patient's current medications.   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   CT abdomen/pelvis on 04/21/2017-7 cm mass lesion in the lateral segment of the left liver associated with smaller lesions in the anterior right liver and medial lobe left liver. This was associated with necrotic lymphadenopathy in the gastrohepatic and hepatoduodenal ligaments.  04/25/2017 status post upper  endoscopy and colonoscopy.   On the upper endoscopy he was found to have 1 moderate benign-appearing intrinsic stenosis at 40 cm from the incisors. This was dilated. The exam of the esophagus was otherwise normal. The entire examined stomach and duodenum also normal.   On the colonoscopy he was found to have a 5 mm polyp in the cecum which was removed. Pathology showed a serrated polyp with features of a sessile serrated polyp; negative for cytologic dysplasia. He was noted to have many small and large mouth diverticula in the sigmoid colon and descending colon. Terminal ileum appeared normal. Internal hemorrhoids were found as well.   04/28/2017 status post biopsy of a liver lesion. Pathology showed adenocarcinoma positive for cytokeratin 7 (strong), CDX-2 (weak) and focal TTF-1. Cytokeratin 20 essentially negative.  CT chest 05/12/2017-no evidence of metastatic disease or a primary chest tumor  oundation1-microsatellite stable; tumor mutational burden 6 Muts/Mb; CDKN28 loss  Cycle 1 gemcitabine/cisplatin 07/01/2017  Cycle 2 gemcitabine/cisplatin 07/15/2017  Cycle 3 gemcitabine/cisplatin 07/29/2017  Cycle 4 gemcitabine/cisplatin 08/12/2017  Cycle 5 gemcitabine/cisplatin 08/26/2017  CT 09/15/2017- decreased size of dominant and smaller hepatic lesions, improved upper abdominal lymphadenopathy  Cycle 6 gemcitabine/cisplatin 09/16/2017  Cycle 7 gemcitabine/cisplatin 09/29/2017  Cycle 8 gemcitabine/cisplatin 10/15/2017  Cycle 9 gemcitabine/cisplatin 11/05/2017 2. Weight loss-improved 3. 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 Alen Blew) 4. Asthma 5. Hypertension 6. Hyperlipidemia 7. Hypothyroid 8. 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  Thrombocytopenia secondary to chemotherapy    Disposition: Mr. Derosia appears stable.  The  platelet count is low today.  This is secondary to chemotherapy.  He is scheduled for surgery next week.  We decided to hold chemotherapy today in anticipation of surgery.  He will return for an office visit in the next cycle of gemcitabine/cisplatin on 12/02/2017.  He will undergo a restaging CT prior to an office visit on 12/16/2017.  Betsy Coder, MD  11/19/2017  8:29 AM

## 2017-11-20 ENCOUNTER — Encounter (HOSPITAL_COMMUNITY): Payer: Self-pay

## 2017-11-21 ENCOUNTER — Encounter (HOSPITAL_COMMUNITY)
Admission: RE | Admit: 2017-11-21 | Discharge: 2017-11-21 | Disposition: A | Discharge status: Home / Self Care | Payer: PPO | Source: Ambulatory Visit | Attending: Vascular Surgery | Admitting: Vascular Surgery

## 2017-11-21 ENCOUNTER — Other Ambulatory Visit: Payer: Self-pay

## 2017-11-21 ENCOUNTER — Encounter (HOSPITAL_COMMUNITY): Payer: Self-pay

## 2017-11-21 DIAGNOSIS — Z01812 Encounter for preprocedural laboratory examination: Secondary | ICD-10-CM | POA: Diagnosis not present

## 2017-11-21 HISTORY — DX: Unspecified osteoarthritis, unspecified site: M19.90

## 2017-11-21 HISTORY — DX: Cerebral infarction, unspecified: I63.9

## 2017-11-21 HISTORY — DX: Hypothyroidism, unspecified: E03.9

## 2017-11-21 LAB — COMPREHENSIVE METABOLIC PANEL
ALT: 13 U/L (ref 0–44)
AST: 20 U/L (ref 15–41)
Albumin: 3.8 g/dL (ref 3.5–5.0)
Alkaline Phosphatase: 53 U/L (ref 38–126)
Anion gap: 8 (ref 5–15)
BILIRUBIN TOTAL: 0.7 mg/dL (ref 0.3–1.2)
BUN: 19 mg/dL (ref 8–23)
CO2: 23 mmol/L (ref 22–32)
Calcium: 9.1 mg/dL (ref 8.9–10.3)
Chloride: 107 mmol/L (ref 98–111)
Creatinine, Ser: 1.07 mg/dL (ref 0.61–1.24)
Glucose, Bld: 120 mg/dL — ABNORMAL HIGH (ref 70–99)
Potassium: 4 mmol/L (ref 3.5–5.1)
Sodium: 138 mmol/L (ref 135–145)
TOTAL PROTEIN: 6.7 g/dL (ref 6.5–8.1)

## 2017-11-21 LAB — TYPE AND SCREEN
ABO/RH(D): O POS
ANTIBODY SCREEN: NEGATIVE

## 2017-11-21 LAB — URINALYSIS, ROUTINE W REFLEX MICROSCOPIC
BILIRUBIN URINE: NEGATIVE
Glucose, UA: NEGATIVE mg/dL
Hgb urine dipstick: NEGATIVE
Ketones, ur: NEGATIVE mg/dL
LEUKOCYTES UA: NEGATIVE
NITRITE: NEGATIVE
PH: 5 (ref 5.0–8.0)
Protein, ur: NEGATIVE mg/dL
SPECIFIC GRAVITY, URINE: 1.018 (ref 1.005–1.030)

## 2017-11-21 LAB — PROTIME-INR
INR: 1.25
PROTHROMBIN TIME: 15.6 s — AB (ref 11.4–15.2)

## 2017-11-21 LAB — CBC
HEMATOCRIT: 30.1 % — AB (ref 39.0–52.0)
Hemoglobin: 9.8 g/dL — ABNORMAL LOW (ref 13.0–17.0)
MCH: 32.7 pg (ref 26.0–34.0)
MCHC: 32.6 g/dL (ref 30.0–36.0)
MCV: 100.3 fL — AB (ref 78.0–100.0)
Platelets: 120 10*3/uL — ABNORMAL LOW (ref 150–400)
RBC: 3 MIL/uL — ABNORMAL LOW (ref 4.22–5.81)
RDW: 14.6 % (ref 11.5–15.5)
WBC: 3.8 10*3/uL — AB (ref 4.0–10.5)

## 2017-11-21 LAB — BLOOD GAS, ARTERIAL
ACID-BASE EXCESS: 1.1 mmol/L (ref 0.0–2.0)
BICARBONATE: 25.1 mmol/L (ref 20.0–28.0)
Drawn by: 421801
FIO2: 21
O2 Saturation: 98.8 %
PCO2 ART: 39.9 mmHg (ref 32.0–48.0)
PH ART: 7.416 (ref 7.350–7.450)
PO2 ART: 118 mmHg — AB (ref 83.0–108.0)
Patient temperature: 98.6

## 2017-11-21 LAB — SURGICAL PCR SCREEN
MRSA, PCR: NEGATIVE
STAPHYLOCOCCUS AUREUS: NEGATIVE

## 2017-11-21 LAB — APTT: aPTT: 35 seconds (ref 24–36)

## 2017-11-21 LAB — ABO/RH: ABO/RH(D): O POS

## 2017-11-21 NOTE — Pre-Procedure Instructions (Addendum)
Michael Mosley  11/21/2017    Your procedure is scheduled on Tuesday, November 25, 2017 at 7:30 AM.   Report to [facility] Entrance "A" Admitting Office at 5:30 AM.   Call this number if you have problems the morning of surgery: [phone]   Questions prior to day of surgery, please call [phone] between 8 & 4 PM.   Remember:  Do not eat or drink after midnight Monday, 11/24/17.  Take these medicines the morning of surgery with A SIP OF WATER: Levothyroxine (Synthroid), Aspirin  Stop Xarelto as instructed by physician (48 hours prior to surgery).  Stop NSAIDS (Ibuprofen, Aleve, etc) and Herbal medications as of today prior to surgery. Do not use Aspirin products prior to surgery.    Do not wear jewelry.  Do not wear lotions, powders, cologne or deodorant.  Men may shave face and neck.  Do not bring valuables to the hospital.  [facility] is not responsible for any belongings or valuables.  Contacts, dentures or bridgework may not be worn into surgery.  Leave your suitcase in the car.  After surgery it may be brought to your room.  For patients admitted to the hospital, discharge time will be determined by your treatment team.  [facility] - Preparing for Surgery  Before surgery, you can play an important role.  Because skin is not sterile, your skin needs to be as free of germs as possible.  You can reduce the number of germs on you skin by washing with CHG (chlorahexidine gluconate) soap before surgery.  CHG is an antiseptic cleaner which kills germs and bonds with the skin to continue killing germs even after washing.  Oral Hygiene is also important in reducing the risk of infection.  Remember to brush your teeth with your regular toothpaste the morning of surgery.  Please DO NOT use if you have an allergy to CHG or antibacterial soaps.  If your skin becomes reddened/irritated stop using the CHG and inform your nurse when you arrive at Short Stay.  Do not shave  (including legs and underarms) for at least 48 hours prior to the first CHG shower.  You may shave your face.  Please follow these instructions carefully:   1.  Shower with CHG Soap the night before surgery and the morning of Surgery.  2.  If you choose to wash your hair, wash your hair first as usual with your normal shampoo.  3.  After you shampoo, rinse your hair and body thoroughly to remove the shampoo. 4.  Use CHG as you would any other liquid soap.  You can apply chg directly to the skin and wash gently with a      scrungie or washcloth.           5.  Apply the CHG Soap to your body ONLY FROM THE NECK DOWN.   Do not use on open wounds or open sores. Avoid contact with your eyes, ears, mouth and genitals (private parts).  Wash genitals (private parts) with your normal soap.  6.  Wash thoroughly, paying special attention to the area where your surgery will be performed.  7.  Thoroughly rinse your body with warm water from the neck down.  8.  DO NOT shower/wash with your normal soap after using and rinsing off the CHG Soap.  9.  Pat yourself dry with a clean towel.            10.  Wear clean pajamas.  11.  Place clean sheets on your bed the night of your first shower and do not sleep with pets.  Day of Surgery  Shower as above. Do not apply any lotions/deodeoants the morning of surgery.   Please wear clean clothes to the hospital. Remember to brush your teeth with toothpaste.   Please read over the fact sheets that you were given.

## 2017-11-21 NOTE — Progress Notes (Signed)
Pt denies cardiac history. 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 Pt is on Xarelto, states he has been instructed to hold Xarelto 48 hours prior to surgery. Denies any recent chest pain or sob. Pt is being treated for liver cancer.

## 2017-11-24 MED ORDER — CEFAZOLIN SODIUM-DEXTROSE 2-4 GM/100ML-% IV SOLN
2.0000 g | INTRAVENOUS | Status: AC
  Administered 2017-11-25: 2 g via INTRAVENOUS
  Filled 2017-11-24: qty 100

## 2017-11-24 NOTE — Progress Notes (Signed)
Anesthesia Chart Review:  Case:  [id number] Date/Time:  11/25/17 0715   Procedure:  ENDARTERECTOMY CAROTID (Right )   Anesthesia type:  General   Pre-op diagnosis:  right carotid stenosis   Location:  [facility] ROOM 11 / [facility]   Surgeon:  Waynetta Sandy, MD      DISCUSSION:67 yo male never smoker for above procedure. Pertinent hx includes HTN, Hypothyroid, Asthma, Stroke (with residual right vision deficit), Tongue CA s/p chemoradiation, Liver CA currently on chemo.  Pt was evaluated by Dr. [name] earlier this year for atypical chest pain. Per his last note 05/26/2017: " He has had a nuclear stress test on 03/21/2017 was considered low risk.  Echocardiogram on 03/25/2017 showed mild aortic stenosis with moderate LVH with normal LVEF of 62%.  ABIs found to show mildly decreased perfusion to the right lower extremity.  He did undergo carotid Doppler on 03/25/2017 that showed greater than 70% stenosis in the right internal carotid artery as well as critical stenosis in the left internal carotid artery.  CTA neck performed 05/12/2017 only revealed about 70% to 75% stenosis of the carotid arteries.  He has not had any further episodes or chest pain.  He was started on aggressive medical therapy.Marland KitchenMarland KitchenFrom cardiac standpoint he can be taken up for any invasive abdominal surgeries with acceptable cardiovascular risk, stress test very mildly abnormal.  I have given him a copy of the echocardiogram and stress test.  He has mild aortic stenosis.  I will see him back in 6 months."  Instructed to hold Xarelto 48hr prior to surgery.  Anticipate he can proceed with surgery as planned barring acute status change.  VS: BP 121/71   Pulse 60   Temp (!) 36.4 C   Resp 18   Ht 6\' 1"  (1.854 m)   Wt 96.1 kg   SpO2 100%   BMI 27.94 kg/m   PROVIDERS: [name], MD is PCP  Kela Millin, MD is Cardiologist  Betsy Coder, MD is Oncologist  LABS: Stable anemia. Mild thrombocytopenia likely secondary to  chemo, chemo held prior to surgery per Dr. Benay Spice.  (all labs ordered are listed, but only abnormal results are displayed)  Labs Reviewed  BLOOD GAS, ARTERIAL - Abnormal; Notable for the following components:      Result Value   pO2, Arterial 118 (*)    All other components within normal limits  CBC - Abnormal; Notable for the following components:   WBC 3.8 (*)    RBC 3.00 (*)    Hemoglobin 9.8 (*)    HCT 30.1 (*)    MCV 100.3 (*)    Platelets 120 (*)    All other components within normal limits  COMPREHENSIVE METABOLIC PANEL - Abnormal; Notable for the following components:   Glucose, Bld 120 (*)    All other components within normal limits  PROTIME-INR - Abnormal; Notable for the following components:   Prothrombin Time 15.6 (*)    All other components within normal limits  SURGICAL PCR SCREEN  APTT  URINALYSIS, ROUTINE W REFLEX MICROSCOPIC  TYPE AND SCREEN  ABO/RH     IMAGES: CHEST  2 VIEW 03/03/2017  COMPARISON:  01/27/2016  FINDINGS: Mildly larger lung volumes. Mediastinal contours remain normal. Visualized tracheal air column is within normal limits. No pneumothorax, pulmonary edema, pleural effusion or confluent pulmonary opacity. Stable borderline to mild increased interstitial markings in both lungs. No acute pulmonary opacity. No acute osseous abnormality identified. Negative visible bowel gas pattern.  IMPRESSION: Negative for age.  No acute cardiopulmonary abnormality.   EKG: 03/03/2017: Sinus bradycardia (53 bpm)  CV: CTA Neck 10/24/2017: IMPRESSION: 75% diameter stenosis right internal carotid artery unchanged. 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  TTE 03/25/2017: Conclusions:  1.  Left ventricle cavity is normal in size.  Moderate concentric hypertrophy of the  left ventricle.  Normal global wall motion.  Normal diastolic filling pattern.  Calculated EF 60%. 2.  Mild ossification of the aortic valve annulus.  Moderate aortic valve leaflet calcification.  Moderately restricted aortic valve leaflets.  Trace aortic regurgitation.  Mild aortic valve stenosis.  Aortic peak valve pressure gradient of 28 and mean gradient 40 mmHg, calculated aortic valve area 1.52 cm.  Lexiscan 03/21/2017 Impression: 1.  The resting electrocardiogram demonstrated normal sinus rhythm, normal resting conduction and no resting arrhythmias.  Stress EKG is nondiagnostic for ischemia as it is a pharmacologic stress using Lexiscan.  Symptoms included symptoms shortness of breath dizziness, stomach pain, and diaphoretic.  Hypertensive at rest with 148/108 mmHg and normal response with Lexiscan infusion. 2.  The LV is very mildly dilated at LVEDV of 135 mm both in rest and stress images.  There is a small area of mild ischemia in the basal inferior and mid inferior myocardial walls.  Left ventricular ejection fraction was calculated to be 50% with mild basal inferior hypokinesis.  This is a low risk study.  Past Medical History:  Diagnosis Date  . Adenocarcinoma determined by biopsy of liver ([place]) 04/28/2017  . Arthritis    left hip  . Asthma   . Cancer ([place])    tongue cancer s/p chemo/RXT, followed by Dr Sondra Come.   Marland Kitchen Heart murmur   . History of radiation therapy 02/2005   CTV 6810 cGy, PTV 6000 cGy, ETV 227 cGy  . Hyperlipidemia   . Hypertension   . Hypothyroidism   . Stroke [facility])    affected right eye - can only see half out of right eye  . Thyroid disease   . Umbilical hernia     Past Surgical History:  Procedure Laterality Date  . COLONOSCOPY    . HERNIA REPAIR     umbilical hernia  . IR FLUORO GUIDE PORT INSERTION RIGHT  06/23/2017  . IR US GUIDE VASC ACCESS RIGHT  06/23/2017  . neck/tongue surgery for cancer    . SHOULDER SURGERY Right     MEDICATIONS: . aspirin EC  81 MG tablet  . ALPRAZolam (XANAX) 0.5 MG tablet  . ezetimibe (ZETIA) 10 MG tablet  . ibuprofen (ADVIL,MOTRIN) 200 MG tablet  . levothyroxine (SYNTHROID, LEVOTHROID) 75 MCG tablet  . lidocaine-prilocaine (EMLA) cream  . ondansetron (ZOFRAN ODT) 4 MG disintegrating tablet  . ondansetron (ZOFRAN) 8 MG tablet  . ramipril (ALTACE) 5 MG capsule  . rosuvastatin (CRESTOR) 40 MG tablet  . Turmeric 500 MG CAPS  . XARELTO 20 MG TABS tablet   . 0.9 %  sodium chloride infusion   . ceFAZolin (ANCEF) IVPB 2g/100 mL premix  . sodium chloride 0.9 % injection 10 mL     Wynonia Musty [facility] Short Stay Center/Anesthesiology Phone [phone] 11/24/2017 9:28 AM

## 2017-11-24 NOTE — Anesthesia Preprocedure Evaluation (Addendum)
Anesthesia Evaluation  Patient identified by MRN, date of birth, ID band  Reviewed: Allergy & Precautions, NPO status , Patient's Chart, lab work & pertinent test results  Airway Mallampati: I  TM Distance: >3 FB Neck ROM: Full    Dental  (+) Partial Upper Lower teeth intact:   Pulmonary neg pulmonary ROS,    Pulmonary exam normal breath sounds clear to auscultation       Cardiovascular hypertension, Normal cardiovascular exam Rhythm:Regular Rate:Normal  TTE 03/2017 EF 60%, mild AS  Carotid Duplex 10/2017 Right Carotid: Velocities in the right ICA are consistent with a 80-99% stenosis. Hemodynamically significant plaque >50% visualized in the CCA.  Left Carotid: Velocities in the left ICA are consistent with a 80-99% stenosis.  Vertebrals: Bilateral vertebral arteries demonstrate antegrade flow.  Subclavians: Normal flow hemodynamics were seen in bilateral subclavian arteries.  Stress Test 03/2017 EF 55%. Small area of ischemia basal inferior and mid inferior left ventricular wall. Low risk study.    Neuro/Psych CVA, Residual Symptoms negative psych ROS   GI/Hepatic negative GI ROS, Neg liver ROS,   Endo/Other  Hypothyroidism   Renal/GU negative Renal ROS     Musculoskeletal  (+) Arthritis ,   Abdominal   Peds  Hematology  (+) anemia ,   Anesthesia Other Findings Right carotid stenosis on xarelto  Reproductive/Obstetrics                            Anesthesia Physical Anesthesia Plan  ASA: III  Anesthesia Plan: General   Post-op Pain Management:    Induction: Intravenous  PONV Risk Score and Plan: 2 and Dexamethasone and Ondansetron  Airway Management Planned: Oral ETT  Additional Equipment: Arterial line  Intra-op Plan:   Post-operative Plan: Extubation in OR  Informed Consent: I have reviewed the patients History and Physical, chart, labs and discussed the procedure  including the risks, benefits and alternatives for the proposed anesthesia with the patient or authorized representative who has indicated his/her understanding and acceptance.   Dental advisory given  Plan Discussed with: CRNA  Anesthesia Plan Comments:         Anesthesia Quick Evaluation

## 2017-11-25 ENCOUNTER — Encounter (HOSPITAL_COMMUNITY)
Admission: RE | Disposition: A | Discharge status: Home / Self Care | Payer: Self-pay | Source: Ambulatory Visit | Attending: Vascular Surgery

## 2017-11-25 ENCOUNTER — Inpatient Hospital Stay (HOSPITAL_COMMUNITY)
Admission: RE | Admit: 2017-11-25 | Discharge: 2017-11-26 | DRG: 039 | Disposition: A | Discharge status: Home / Self Care | Payer: PPO | Source: Ambulatory Visit | Attending: Vascular Surgery | Admitting: Vascular Surgery

## 2017-11-25 ENCOUNTER — Inpatient Hospital Stay (HOSPITAL_COMMUNITY): Payer: PPO | Admitting: Certified Registered"

## 2017-11-25 ENCOUNTER — Encounter (HOSPITAL_COMMUNITY): Payer: Self-pay | Admitting: Urology

## 2017-11-25 ENCOUNTER — Inpatient Hospital Stay (HOSPITAL_COMMUNITY): Payer: PPO | Admitting: Physician Assistant

## 2017-11-25 DIAGNOSIS — Z9221 Personal history of antineoplastic chemotherapy: Secondary | ICD-10-CM

## 2017-11-25 DIAGNOSIS — E785 Hyperlipidemia, unspecified: Secondary | ICD-10-CM | POA: Diagnosis not present

## 2017-11-25 DIAGNOSIS — Z888 Allergy status to other drugs, medicaments and biological substances status: Secondary | ICD-10-CM | POA: Diagnosis not present

## 2017-11-25 DIAGNOSIS — E039 Hypothyroidism, unspecified: Secondary | ICD-10-CM | POA: Diagnosis present

## 2017-11-25 DIAGNOSIS — Z923 Personal history of irradiation: Secondary | ICD-10-CM

## 2017-11-25 DIAGNOSIS — M7981 Nontraumatic hematoma of soft tissue: Secondary | ICD-10-CM | POA: Diagnosis not present

## 2017-11-25 DIAGNOSIS — I10 Essential (primary) hypertension: Secondary | ICD-10-CM | POA: Diagnosis not present

## 2017-11-25 DIAGNOSIS — Z8673 Personal history of transient ischemic attack (TIA), and cerebral infarction without residual deficits: Secondary | ICD-10-CM | POA: Diagnosis not present

## 2017-11-25 DIAGNOSIS — I6521 Occlusion and stenosis of right carotid artery: Secondary | ICD-10-CM | POA: Diagnosis not present

## 2017-11-25 DIAGNOSIS — Z85810 Personal history of malignant neoplasm of tongue: Secondary | ICD-10-CM

## 2017-11-25 DIAGNOSIS — J45909 Unspecified asthma, uncomplicated: Secondary | ICD-10-CM | POA: Diagnosis not present

## 2017-11-25 DIAGNOSIS — I6529 Occlusion and stenosis of unspecified carotid artery: Secondary | ICD-10-CM | POA: Diagnosis present

## 2017-11-25 HISTORY — PX: CAROTID ENDARTERECTOMY: SUR193

## 2017-11-25 HISTORY — PX: ENDARTERECTOMY: SHX5162

## 2017-11-25 HISTORY — PX: PATCH ANGIOPLASTY: SHX6230

## 2017-11-25 LAB — POCT ACTIVATED CLOTTING TIME
Activated Clotting Time: 241 seconds
Activated Clotting Time: 274 seconds

## 2017-11-25 LAB — PROTIME-INR
INR: 1.07
PROTHROMBIN TIME: 13.8 s (ref 11.4–15.2)

## 2017-11-25 SURGERY — ENDARTERECTOMY, CAROTID
Anesthesia: General | Site: Neck | Laterality: Right

## 2017-11-25 MED ORDER — LIDOCAINE 2% (20 MG/ML) 5 ML SYRINGE
INTRAMUSCULAR | Status: AC
  Filled 2017-11-25: qty 5

## 2017-11-25 MED ORDER — ALUM & MAG HYDROXIDE-SIMETH 200-200-20 MG/5ML PO SUSP: 15.0000 mL | ORAL | Status: DC | PRN

## 2017-11-25 MED ORDER — ONDANSETRON HCL 4 MG/2ML IJ SOLN
4.0000 mg | Freq: Once | INTRAMUSCULAR | Status: AC
  Administered 2017-11-25: 4 mg via INTRAVENOUS

## 2017-11-25 MED ORDER — PANTOPRAZOLE SODIUM 40 MG PO TBEC
40.0000 mg | DELAYED_RELEASE_TABLET | Freq: Every day | ORAL | Status: DC
  Administered 2017-11-26: 40 mg via ORAL
  Filled 2017-11-25: qty 1

## 2017-11-25 MED ORDER — HEPARIN SODIUM (PORCINE) 1000 UNIT/ML IJ SOLN
INTRAMUSCULAR | Status: AC
  Filled 2017-11-25: qty 1

## 2017-11-25 MED ORDER — MORPHINE SULFATE (PF) 2 MG/ML IV SOLN: 2.0000 mg | INTRAVENOUS | Status: DC | PRN

## 2017-11-25 MED ORDER — LACTATED RINGERS IV SOLN
INTRAVENOUS | Status: DC | PRN
  Administered 2017-11-25: 07:00:00 via INTRAVENOUS

## 2017-11-25 MED ORDER — EZETIMIBE 10 MG PO TABS
10.0000 mg | ORAL_TABLET | Freq: Every day | ORAL | Status: DC
  Administered 2017-11-25 – 2017-11-26 (×2): 10 mg via ORAL
  Filled 2017-11-25 (×2): qty 1

## 2017-11-25 MED ORDER — FENTANYL CITRATE (PF) 100 MCG/2ML IJ SOLN
INTRAMUSCULAR | Status: AC
  Filled 2017-11-25: qty 2

## 2017-11-25 MED ORDER — LABETALOL HCL 5 MG/ML IV SOLN
INTRAVENOUS | Status: AC
  Filled 2017-11-25: qty 4

## 2017-11-25 MED ORDER — ONDANSETRON HCL 4 MG/2ML IJ SOLN
INTRAMUSCULAR | Status: DC | PRN
  Administered 2017-11-25: 4 mg via INTRAVENOUS

## 2017-11-25 MED ORDER — PHENYLEPHRINE HCL 10 MG/ML IJ SOLN
INTRAMUSCULAR | Status: DC | PRN
  Administered 2017-11-25: 160 ug via INTRAVENOUS
  Administered 2017-11-25 (×3): 60 ug via INTRAVENOUS
  Administered 2017-11-25: 80 ug via INTRAVENOUS

## 2017-11-25 MED ORDER — LEVOTHYROXINE SODIUM 75 MCG PO TABS
75.0000 ug | ORAL_TABLET | Freq: Every day | ORAL | Status: DC
  Administered 2017-11-26: 75 ug via ORAL
  Filled 2017-11-25: qty 1

## 2017-11-25 MED ORDER — HYDROMORPHONE HCL 1 MG/ML IJ SOLN
INTRAMUSCULAR | Status: AC
  Filled 2017-11-25: qty 1

## 2017-11-25 MED ORDER — EPHEDRINE 5 MG/ML INJ
INTRAVENOUS | Status: AC
  Filled 2017-11-25: qty 10

## 2017-11-25 MED ORDER — ARTIFICIAL TEARS OPHTHALMIC OINT
TOPICAL_OINTMENT | OPHTHALMIC | Status: DC | PRN
  Administered 2017-11-25: 1 via OPHTHALMIC

## 2017-11-25 MED ORDER — SODIUM CHLORIDE 0.9 % IV SOLN
0.0125 ug/kg/min | INTRAVENOUS | Status: DC
  Filled 2017-11-25 (×2): qty 2000

## 2017-11-25 MED ORDER — CHLORHEXIDINE GLUCONATE CLOTH 2 % EX PADS: 6.0000 | MEDICATED_PAD | Freq: Once | CUTANEOUS | Status: DC

## 2017-11-25 MED ORDER — GUAIFENESIN-DM 100-10 MG/5ML PO SYRP: 15.0000 mL | ORAL_SOLUTION | ORAL | Status: DC | PRN

## 2017-11-25 MED ORDER — PROTAMINE SULFATE 10 MG/ML IV SOLN
INTRAVENOUS | Status: DC | PRN
  Administered 2017-11-25: 25 mg via INTRAVENOUS

## 2017-11-25 MED ORDER — HYDRALAZINE HCL 20 MG/ML IJ SOLN
5.0000 mg | INTRAMUSCULAR | Status: AC | PRN
  Administered 2017-11-25 (×2): 5 mg via INTRAVENOUS

## 2017-11-25 MED ORDER — PROPOFOL 10 MG/ML IV BOLUS
INTRAVENOUS | Status: AC
  Filled 2017-11-25: qty 20

## 2017-11-25 MED ORDER — ONDANSETRON HCL 4 MG/2ML IJ SOLN: 4.0000 mg | Freq: Four times a day (QID) | INTRAMUSCULAR | Status: DC | PRN

## 2017-11-25 MED ORDER — ROSUVASTATIN CALCIUM 40 MG PO TABS
40.0000 mg | ORAL_TABLET | Freq: Every evening | ORAL | Status: DC
  Administered 2017-11-25: 40 mg via ORAL
  Filled 2017-11-25: qty 4
  Filled 2017-11-25: qty 1

## 2017-11-25 MED ORDER — SODIUM CHLORIDE 0.9 % IV SOLN
INTRAVENOUS | Status: DC | PRN
  Administered 2017-11-25: 500 mL

## 2017-11-25 MED ORDER — ARTIFICIAL TEARS OPHTHALMIC OINT
TOPICAL_OINTMENT | OPHTHALMIC | Status: AC
  Filled 2017-11-25: qty 3.5

## 2017-11-25 MED ORDER — FENTANYL CITRATE (PF) 250 MCG/5ML IJ SOLN
INTRAMUSCULAR | Status: AC
  Filled 2017-11-25: qty 5

## 2017-11-25 MED ORDER — SODIUM CHLORIDE 0.9 % IV SOLN
INTRAVENOUS | Status: AC
  Filled 2017-11-25: qty 1.2

## 2017-11-25 MED ORDER — SODIUM CHLORIDE 0.9 % IV SOLN
INTRAVENOUS | Status: DC
  Administered 2017-11-25: 16:00:00 via INTRAVENOUS

## 2017-11-25 MED ORDER — TRAMADOL HCL 50 MG PO TABS: 50.0000 mg | ORAL_TABLET | Freq: Four times a day (QID) | ORAL | Status: DC | PRN

## 2017-11-25 MED ORDER — ASPIRIN EC 81 MG PO TBEC
81.0000 mg | DELAYED_RELEASE_TABLET | Freq: Every day | ORAL | Status: DC
  Administered 2017-11-25 – 2017-11-26 (×2): 81 mg via ORAL
  Filled 2017-11-25 (×2): qty 1

## 2017-11-25 MED ORDER — MIDAZOLAM HCL 5 MG/5ML IJ SOLN
INTRAMUSCULAR | Status: DC | PRN
  Administered 2017-11-25: 2 mg via INTRAVENOUS

## 2017-11-25 MED ORDER — DEXTRAN 40 IN D5W 10 % IV SOLN
INTRAVENOUS | Status: AC
  Filled 2017-11-25: qty 500

## 2017-11-25 MED ORDER — ALPRAZOLAM 0.5 MG PO TABS
0.5000 mg | ORAL_TABLET | Freq: Every evening | ORAL | Status: DC | PRN
  Administered 2017-11-25: 0.5 mg via ORAL
  Filled 2017-11-25: qty 1

## 2017-11-25 MED ORDER — PROPOFOL 10 MG/ML IV BOLUS
INTRAVENOUS | Status: DC | PRN
  Administered 2017-11-25: 30 mg via INTRAVENOUS
  Administered 2017-11-25: 100 mg via INTRAVENOUS
  Administered 2017-11-25: 30 mg via INTRAVENOUS

## 2017-11-25 MED ORDER — HEMOSTATIC AGENTS (NO CHARGE) OPTIME
TOPICAL | Status: DC | PRN
  Administered 2017-11-25: 1 via TOPICAL

## 2017-11-25 MED ORDER — SUGAMMADEX SODIUM 200 MG/2ML IV SOLN
INTRAVENOUS | Status: DC | PRN
  Administered 2017-11-25: 200 mg via INTRAVENOUS

## 2017-11-25 MED ORDER — ROCURONIUM BROMIDE 50 MG/5ML IV SOSY
PREFILLED_SYRINGE | INTRAVENOUS | Status: AC
  Filled 2017-11-25: qty 5

## 2017-11-25 MED ORDER — ACETAMINOPHEN 325 MG RE SUPP: 325.0000 mg | RECTAL | Status: DC | PRN

## 2017-11-25 MED ORDER — HEPARIN SODIUM (PORCINE) 1000 UNIT/ML IJ SOLN
INTRAMUSCULAR | Status: DC | PRN
  Administered 2017-11-25: 10000 [IU] via INTRAVENOUS
  Administered 2017-11-25: 3000 [IU] via INTRAVENOUS

## 2017-11-25 MED ORDER — LIDOCAINE HCL (PF) 1 % IJ SOLN
INTRAMUSCULAR | Status: AC
  Filled 2017-11-25: qty 30

## 2017-11-25 MED ORDER — DEXTRAN 40 IN D5W 10 % IV SOLN
INTRAVENOUS | Status: AC | PRN
  Administered 2017-11-25: 500 mL

## 2017-11-25 MED ORDER — SODIUM CHLORIDE 0.9 % IV SOLN
INTRAVENOUS | Status: DC | PRN
  Administered 2017-11-25: 10:00:00 via INTRAVENOUS
  Administered 2017-11-25: .2 ug/kg/min via INTRAVENOUS

## 2017-11-25 MED ORDER — EPHEDRINE SULFATE 50 MG/ML IJ SOLN
INTRAMUSCULAR | Status: DC | PRN
  Administered 2017-11-25: 15 mg via INTRAVENOUS
  Administered 2017-11-25 (×2): 2.5 mg via INTRAVENOUS

## 2017-11-25 MED ORDER — HYDROMORPHONE HCL 1 MG/ML IJ SOLN
0.2500 mg | INTRAMUSCULAR | Status: DC | PRN
  Administered 2017-11-25 (×2): 0.5 mg via INTRAVENOUS

## 2017-11-25 MED ORDER — MAGNESIUM SULFATE 2 GM/50ML IV SOLN: 2.0000 g | Freq: Every day | INTRAVENOUS | Status: DC | PRN

## 2017-11-25 MED ORDER — HYDRALAZINE HCL 20 MG/ML IJ SOLN
INTRAMUSCULAR | Status: AC
  Filled 2017-11-25: qty 1

## 2017-11-25 MED ORDER — CEFAZOLIN SODIUM-DEXTROSE 2-4 GM/100ML-% IV SOLN
2.0000 g | Freq: Three times a day (TID) | INTRAVENOUS | Status: AC
  Administered 2017-11-25 (×2): 2 g via INTRAVENOUS
  Filled 2017-11-25 (×2): qty 100

## 2017-11-25 MED ORDER — FENTANYL CITRATE (PF) 100 MCG/2ML IJ SOLN
25.0000 ug | INTRAMUSCULAR | Status: DC | PRN
  Administered 2017-11-25 (×3): 50 ug via INTRAVENOUS

## 2017-11-25 MED ORDER — CEFAZOLIN SODIUM-DEXTROSE 2-3 GM-%(50ML) IV SOLR
INTRAVENOUS | Status: DC | PRN
  Administered 2017-11-25: 2 g via INTRAVENOUS

## 2017-11-25 MED ORDER — PROMETHAZINE HCL 25 MG/ML IJ SOLN
6.2500 mg | Freq: Once | INTRAMUSCULAR | Status: AC
  Administered 2017-11-25: 6.25 mg via INTRAVENOUS

## 2017-11-25 MED ORDER — ACETAMINOPHEN 325 MG PO TABS
325.0000 mg | ORAL_TABLET | ORAL | Status: DC | PRN
  Administered 2017-11-26: 650 mg via ORAL
  Filled 2017-11-25: qty 2

## 2017-11-25 MED ORDER — RAMIPRIL 5 MG PO CAPS
5.0000 mg | ORAL_CAPSULE | Freq: Every evening | ORAL | Status: DC
  Administered 2017-11-25: 5 mg via ORAL
  Filled 2017-11-25: qty 1

## 2017-11-25 MED ORDER — MIDAZOLAM HCL 2 MG/2ML IJ SOLN
INTRAMUSCULAR | Status: AC
  Filled 2017-11-25: qty 2

## 2017-11-25 MED ORDER — LABETALOL HCL 5 MG/ML IV SOLN
10.0000 mg | INTRAVENOUS | Status: DC | PRN
  Administered 2017-11-25 (×3): 10 mg via INTRAVENOUS

## 2017-11-25 MED ORDER — PHENYLEPHRINE 40 MCG/ML (10ML) SYRINGE FOR IV PUSH (FOR BLOOD PRESSURE SUPPORT)
PREFILLED_SYRINGE | INTRAVENOUS | Status: AC
  Filled 2017-11-25: qty 10

## 2017-11-25 MED ORDER — POTASSIUM CHLORIDE CRYS ER 20 MEQ PO TBCR: 20.0000 meq | EXTENDED_RELEASE_TABLET | Freq: Every day | ORAL | Status: DC | PRN

## 2017-11-25 MED ORDER — PROTAMINE SULFATE 10 MG/ML IV SOLN
INTRAVENOUS | Status: AC
  Filled 2017-11-25: qty 25

## 2017-11-25 MED ORDER — SODIUM CHLORIDE 0.9 % IV SOLN: INTRAVENOUS | Status: DC

## 2017-11-25 MED ORDER — ONDANSETRON HCL 4 MG PO TABS: 4.0000 mg | ORAL_TABLET | Freq: Three times a day (TID) | ORAL | Status: DC | PRN

## 2017-11-25 MED ORDER — PHENOL 1.4 % MT LIQD: 1.0000 | OROMUCOSAL | Status: DC | PRN

## 2017-11-25 MED ORDER — SODIUM CHLORIDE 0.9 % IV SOLN: 500.0000 mL | Freq: Once | INTRAVENOUS | Status: DC | PRN

## 2017-11-25 MED ORDER — DOCUSATE SODIUM 100 MG PO CAPS
100.0000 mg | ORAL_CAPSULE | Freq: Every day | ORAL | Status: DC
  Administered 2017-11-26: 100 mg via ORAL
  Filled 2017-11-25: qty 1

## 2017-11-25 MED ORDER — 0.9 % SODIUM CHLORIDE (POUR BTL) OPTIME
TOPICAL | Status: DC | PRN
  Administered 2017-11-25 (×2): 1000 mL

## 2017-11-25 MED ORDER — SODIUM CHLORIDE 0.9 % IV SOLN
INTRAVENOUS | Status: DC | PRN
  Administered 2017-11-25: 25 ug/min via INTRAVENOUS

## 2017-11-25 MED ORDER — PROMETHAZINE HCL 25 MG/ML IJ SOLN
INTRAMUSCULAR | Status: AC
  Filled 2017-11-25: qty 1

## 2017-11-25 MED ORDER — DEXAMETHASONE SODIUM PHOSPHATE 10 MG/ML IJ SOLN
INTRAMUSCULAR | Status: DC | PRN
  Administered 2017-11-25: 5 mg via INTRAVENOUS

## 2017-11-25 MED ORDER — ONDANSETRON HCL 4 MG/2ML IJ SOLN
INTRAMUSCULAR | Status: AC
  Filled 2017-11-25: qty 2

## 2017-11-25 MED ORDER — DEXAMETHASONE SODIUM PHOSPHATE 10 MG/ML IJ SOLN
INTRAMUSCULAR | Status: AC
  Filled 2017-11-25: qty 1

## 2017-11-25 MED ORDER — METOPROLOL TARTRATE 5 MG/5ML IV SOLN: 2.0000 mg | INTRAVENOUS | Status: DC | PRN

## 2017-11-25 MED ORDER — POLYETHYLENE GLYCOL 3350 17 G PO PACK: 17.0000 g | PACK | Freq: Every day | ORAL | Status: DC | PRN

## 2017-11-25 MED ORDER — LIDOCAINE HCL (CARDIAC) PF 100 MG/5ML IV SOSY
PREFILLED_SYRINGE | INTRAVENOUS | Status: DC | PRN
  Administered 2017-11-25: 80 mg via INTRAVENOUS

## 2017-11-25 MED ORDER — FENTANYL CITRATE (PF) 100 MCG/2ML IJ SOLN
INTRAMUSCULAR | Status: DC | PRN
  Administered 2017-11-25: 100 ug via INTRAVENOUS
  Administered 2017-11-25: 50 ug via INTRAVENOUS

## 2017-11-25 MED ORDER — ROCURONIUM BROMIDE 100 MG/10ML IV SOLN
INTRAVENOUS | Status: DC | PRN
  Administered 2017-11-25: 50 mg via INTRAVENOUS
  Administered 2017-11-25: 10 mg via INTRAVENOUS
  Administered 2017-11-25: 20 mg via INTRAVENOUS
  Administered 2017-11-25: 10 mg via INTRAVENOUS

## 2017-11-25 SURGICAL SUPPLY — 47 items
CANISTER SUCT 3000ML PPV (MISCELLANEOUS) ×3 IMPLANT
CATH ROBINSON RED A/P 18FR (CATHETERS) ×3 IMPLANT
CLIP VESOCCLUDE MED 24/CT (CLIP) ×3 IMPLANT
CLIP VESOCCLUDE SM WIDE 24/CT (CLIP) ×3 IMPLANT
COVER PROBE W GEL 5X96 (DRAPES) IMPLANT
CRADLE DONUT ADULT HEAD (MISCELLANEOUS) ×3 IMPLANT
DERMABOND ADVANCED (GAUZE/BANDAGES/DRESSINGS) ×2
DERMABOND ADVANCED .7 DNX12 (GAUZE/BANDAGES/DRESSINGS) ×1 IMPLANT
DRAIN CHANNEL 15F RND FF W/TCR (WOUND CARE) IMPLANT
ELECT REM PT RETURN 9FT ADLT (ELECTROSURGICAL) ×3
ELECTRODE REM PT RTRN 9FT ADLT (ELECTROSURGICAL) ×1 IMPLANT
EVACUATOR SILICONE 100CC (DRAIN) IMPLANT
GLOVE BIO SURGEON STRL SZ7.5 (GLOVE) ×3 IMPLANT
GOWN STRL REUS W/ TWL LRG LVL3 (GOWN DISPOSABLE) ×2 IMPLANT
GOWN STRL REUS W/ TWL XL LVL3 (GOWN DISPOSABLE) ×1 IMPLANT
GOWN STRL REUS W/TWL LRG LVL3 (GOWN DISPOSABLE) ×4
GOWN STRL REUS W/TWL XL LVL3 (GOWN DISPOSABLE) ×2
HEMOSTAT SNOW SURGICEL 2X4 (HEMOSTASIS) ×3 IMPLANT
INSERT FOGARTY SM (MISCELLANEOUS) IMPLANT
IV ADAPTER SYR DOUBLE MALE LL (MISCELLANEOUS) IMPLANT
KIT BASIN OR (CUSTOM PROCEDURE TRAY) ×3 IMPLANT
KIT SHUNT ARGYLE CAROTID ART 6 (VASCULAR PRODUCTS) ×3 IMPLANT
KIT TURNOVER KIT B (KITS) ×3 IMPLANT
NEEDLE HYPO 25GX1X1/2 BEV (NEEDLE) IMPLANT
NEEDLE SPNL 20GX3.5 QUINCKE YW (NEEDLE) IMPLANT
NS IRRIG 1000ML POUR BTL (IV SOLUTION) ×9 IMPLANT
PACK CAROTID (CUSTOM PROCEDURE TRAY) ×3 IMPLANT
PAD ARMBOARD 7.5X6 YLW CONV (MISCELLANEOUS) ×6 IMPLANT
PATCH HEMASHIELD 8X150 (Vascular Products) ×3 IMPLANT
PATCH VASC XENOSURE 1CMX6CM (Vascular Products) IMPLANT
PATCH VASC XENOSURE 1X6 (Vascular Products) IMPLANT
SHUNT CAROTID BYPASS 10 (VASCULAR PRODUCTS) IMPLANT
SHUNT CAROTID BYPASS 12FRX15.5 (VASCULAR PRODUCTS) IMPLANT
STOPCOCK 4 WAY LG BORE MALE ST (IV SETS) IMPLANT
SUT ETHILON 3 0 PS 1 (SUTURE) IMPLANT
SUT MNCRL AB 4-0 PS2 18 (SUTURE) ×3 IMPLANT
SUT PROLENE 6 0 BV (SUTURE) ×30 IMPLANT
SUT SILK 3 0 (SUTURE)
SUT SILK 3-0 18XBRD TIE 12 (SUTURE) IMPLANT
SUT VIC AB 2-0 CT1 27 (SUTURE) ×2
SUT VIC AB 2-0 CT1 TAPERPNT 27 (SUTURE) ×1 IMPLANT
SUT VIC AB 3-0 SH 27 (SUTURE) ×2
SUT VIC AB 3-0 SH 27X BRD (SUTURE) ×1 IMPLANT
SYR CONTROL 10ML LL (SYRINGE) IMPLANT
TOWEL GREEN STERILE (TOWEL DISPOSABLE) ×3 IMPLANT
TUBING ART PRESS 48 MALE/FEM (TUBING) IMPLANT
WATER STERILE IRR 1000ML POUR (IV SOLUTION) ×3 IMPLANT

## 2017-11-25 NOTE — Anesthesia Postprocedure Evaluation (Signed)
Anesthesia Post Note  Patient: Michael Mosley  Procedure(s) Performed: ENDARTERECTOMY CAROTID (Right Neck) PATCH ANGIOPLASTY (Right Neck)     Patient location during evaluation: PACU Anesthesia Type: General Level of consciousness: awake and alert Pain management: pain level controlled Vital Signs Assessment: post-procedure vital signs reviewed and stable Respiratory status: spontaneous breathing, nonlabored ventilation, respiratory function stable and patient connected to nasal cannula oxygen Cardiovascular status: blood pressure returned to baseline and stable Postop Assessment: no apparent nausea or vomiting Anesthetic complications: no    Last Vitals:  Vitals:   11/25/17 1415 11/25/17 1420  BP:  114/77  Pulse: (!) 54 (!) 56  Resp: (!) 9 12  Temp:    SpO2: 99% 100%    Last Pain:  Vitals:   11/25/17 1334  TempSrc:   PainSc: 4                  Dontez Hauss L Kinza Gouveia

## 2017-11-25 NOTE — Plan of Care (Signed)

## 2017-11-25 NOTE — Op Note (Signed)
Patient name: Michael Mosley MRN: [id number] DOB: 1950/08/18 Sex: male  11/25/2017 Pre-operative Diagnosis: Symptomatic high-grade right carotid stenosis Post-operative diagnosis:  Same Surgeon:  Erlene Quan C. Donzetta Matters, MD Assistant: Arlee Muslim, PA Procedure Performed: Right carotid endarterectomy with Dacron patch angioplasty  Indications: 67 year old male with a history of a right retinal artery occlusion.  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  He has a high-grade stenosis that extends for a very long.  From his common carotid artery to his internal carotid artery which is quite high.  He is indicated for intervention we have elected to proceed with carotid endarterectomy.  Findings: There is a common carotid artery significant calcification we clamped below this.  The internal carotid artery itself was quite friable did have to be plicated at its very takeoff.  There was an ulcerated plaque in the most cephalad aspect of the internal carotid artery at the level of the hypoglossal nerve.  The nerve was protected throughout its course was retracted throughout the case.  The vagus nerve was also protected.  We had to use a dacryon patch given the length of our arteriotomy.  At completion there was a strong signal in the internal carotid artery as well as the external carotid artery.   Procedure:  The patient was identified in the holding area and taken to the operating room where is placed supine the operating table and general anesthesia was induced.  He was sterilely prepped and draped in the right neck given antibiotics and a timeout was called.  A longitudinal incision was made along the anterior border the sternocleidomastoid.  Given his previous radiation much of the tissue was quite friable as well as sticky.  We are able to get proximal control around the common carotid artery placed a umbilical tape there.  We dissected up onto the external initially placed a  vessel loop around the superior thyroidal branch although this was later ligated for extra length.  We then climbed up on the external carotid artery placed a vessel loop around this.  Identify the hypoglossal nerve.  Patient was given 10,000 units of heparin at this time ACT returned to 40 and he was given an additional 3000.  We dissected up onto the internal carotid artery.  The ansa cervicalis was divided between clips to mobilize the hypoglossal nerve.  When I was approximately 4 cm above the bifurcation placed a vessel loop around the internal carotid artery there.  We then prepared a 10 [place] shunt as well as bovine pericardial patch.  I then clamped the internal carotid artery followed by the common and the external.  We open the artery longitudinally placed a shunt distally allowed to backbleed and then placed it proximally.  We confirm flow with Doppler.  We then began with endarterectomy beginning at the common carotid and unfortunately given the friability of the artery there was a tear in the internal carotid artery just at the takeoff.  We continued with her endarterectomy and we performed eversion of the external.  At this time we ligated the superior thyroidal branch to give [place] extra length.  At the most distal aspect of our arteriotomy there was heavy calcified plaque that was ulcerated and quite adherent to the vessel.  We removed the rest of our plaque.  We then began by attempting to remove the ulcerated portion.  We used a Art therapist as well as plaque pickers we are able to ultimately get a good endpoint.  There was no need for tacking at that level.  Given that it is been almost 1 hour we did give an additional 5000 units of heparin and ACT returned 270.  We then plicated our takeoff of our internal carotid artery with interrupted 6-0 Prolene suture.  Given the length of arteriotomy and then elected to use a dacryon patch.  This was brought to the table.  We irrigated our arteriotomy with both  heparin as well as dextran.  We then sewed our patch in place with 6-0 Prolene suture.  Prior to completing the patch we removed our shunt allowed antegrade backbleeding from all of the vessels and again heavily flushed with heparin followed by dextran.  Upon completion we had good signals in her external and internal carotid arteries.  We did place a few repair stitches.  25 mg of protamine was given which is half of a normal dose but given the friability of the vessel I do not want to cause any thrombosis in the artery.  We irrigated the wound we then closed in layers the platysma with Vicryl the skin with Monocryl and Dermabond was placed above that level.  He was allowed away from anesthesia noted to be neurologically intact and was transferred the PACU in stable condition.  EBL: 100cc  Brandon C. Donzetta Matters, MD [facility] [place] Office: [phone] Pager: [phone]

## 2017-11-25 NOTE — Transfer of Care (Signed)
Immediate Anesthesia Transfer of Care Note  Patient: Michael Mosley  Procedure(s) Performed: ENDARTERECTOMY CAROTID (Right Neck) PATCH ANGIOPLASTY (Right Neck)  Patient Location: PACU  Anesthesia Type:General  Level of Consciousness: awake and patient cooperative  Airway & Oxygen Therapy: Patient Spontanous Breathing  Post-op Assessment: Report given to RN and Post -op Vital signs reviewed and stable  Post vital signs: Reviewed and stable  Last Vitals:  Vitals Value Taken Time  BP 177/99 11/25/2017 10:34 AM  Temp    Pulse 60 11/25/2017 10:41 AM  Resp 13 11/25/2017 10:41 AM  SpO2 100 % 11/25/2017 10:41 AM  Vitals shown include unvalidated device data.  Last Pain:  Vitals:   11/25/17 0629  TempSrc: Oral  PainSc:       Patients Stated Pain Goal: 0 (83/72/90 2111)  Complications: No apparent anesthesia complications

## 2017-11-25 NOTE — H&P (Signed)
   History and Physical Update  The patient was interviewed and re-examined.  The patient's previous History and Physical has been reviewed and is unchanged from recent office visit. Plan for right cea today in OR.   Lissandro Dilorenzo C. Donzetta Matters, MD [facility] [place] Office: [phone] Pager: [phone]  11/25/2017, 6:56 AM

## 2017-11-25 NOTE — Progress Notes (Signed)
Patient arrived on the unit from PACU assesemnt complete see flowsheet, placed on tele ccmd notified, patient oriented to room and staff, bed in lowest position, call bell within reach will monitor.

## 2017-11-25 NOTE — Anesthesia Procedure Notes (Signed)
Arterial Line Insertion Start/End9/17/2019 7:00 AM, 11/25/2017 7:10 AM Performed by: Lance Coon, CRNA, CRNA  Patient location: Pre-op. Preanesthetic checklist: patient identified, IV checked, site marked, risks and benefits discussed, surgical consent, monitors and equipment checked, pre-op evaluation, timeout performed and anesthesia consent Lidocaine 1% used for infiltration Left, radial was placed Catheter size: 20 G Hand hygiene performed , maximum sterile barriers used  and Seldinger technique used Allen's test indicative of satisfactory collateral circulation Attempts: 1 Procedure performed without using ultrasound guided technique. Following insertion, dressing applied and Biopatch. Post procedure assessment: normal  Patient tolerated the procedure well with no immediate complications.

## 2017-11-25 NOTE — Anesthesia Procedure Notes (Signed)
Procedure Name: Intubation Date/Time: 11/25/2017 7:42 AM Performed by: Lance Coon, CRNA Pre-anesthesia Checklist: Patient identified, Emergency Drugs available, Suction available, Patient being monitored and Timeout performed Patient Re-evaluated:Patient Re-evaluated prior to induction Oxygen Delivery Method: Circle system utilized Preoxygenation: Pre-oxygenation with 100% oxygen Induction Type: IV induction Ventilation: Mask ventilation with difficulty and Oral airway inserted - appropriate to patient size Laryngoscope Size: Mac and 4 Grade View: Grade I Tube type: Oral Laser Tube: Cuffed inflated with minimal occlusive pressure - saline Tube size: 8.0 mm Number of attempts: 1 Airway Equipment and Method: Stylet Placement Confirmation: ETT inserted through vocal cords under direct vision,  positive ETCO2,  CO2 detector and breath sounds checked- equal and bilateral Secured at: 23 cm Tube secured with: Tape Dental Injury: Teeth and Oropharynx as per pre-operative assessment

## 2017-11-26 ENCOUNTER — Encounter (HOSPITAL_COMMUNITY): Payer: Self-pay | Admitting: General Practice

## 2017-11-26 ENCOUNTER — Other Ambulatory Visit: Payer: Self-pay

## 2017-11-26 ENCOUNTER — Telehealth: Payer: Self-pay | Admitting: Vascular Surgery

## 2017-11-26 LAB — CBC
HCT: 25.7 % — ABNORMAL LOW (ref 39.0–52.0)
HEMOGLOBIN: 8.5 g/dL — AB (ref 13.0–17.0)
MCH: 32.7 pg (ref 26.0–34.0)
MCHC: 33.1 g/dL (ref 30.0–36.0)
MCV: 98.8 fL (ref 78.0–100.0)
Platelets: 153 10*3/uL (ref 150–400)
RBC: 2.6 MIL/uL — ABNORMAL LOW (ref 4.22–5.81)
RDW: 14.5 % (ref 11.5–15.5)
WBC: 4.7 10*3/uL (ref 4.0–10.5)

## 2017-11-26 LAB — BASIC METABOLIC PANEL
Anion gap: 10 (ref 5–15)
BUN: 14 mg/dL (ref 8–23)
CHLORIDE: 102 mmol/L (ref 98–111)
CO2: 26 mmol/L (ref 22–32)
Calcium: 8.8 mg/dL — ABNORMAL LOW (ref 8.9–10.3)
Creatinine, Ser: 1.22 mg/dL (ref 0.61–1.24)
GFR calc Af Amer: >60 mL/min (ref >60)
GFR calc non Af Amer: >60 mL/min (ref >60)
GLUCOSE: 124 mg/dL — AB (ref 70–99)
Potassium: 4 mmol/L (ref 3.5–5.1)
Sodium: 138 mmol/L (ref 135–145)

## 2017-11-26 MED ORDER — TRAMADOL HCL 50 MG PO TABS: 50.0000 mg | ORAL_TABLET | Freq: Four times a day (QID) | ORAL | 0 refills | Status: AC | PRN

## 2017-11-26 NOTE — Progress Notes (Addendum)
  Progress Note    11/26/2017 7:34 AM 1 Day Post-Op  Subjective:  Hoarse voice; no stroke like symptoms including slurring speech, changes in vision, or one sided weakness   Vitals:   11/26/17 0418 11/26/17 0723  BP: 110/60 (!) 158/92  Pulse: 62 63  Resp: 17 16  Temp: 97.6 F (36.4 C) 98 F (36.7 C)  SpO2: 96% 100%   Physical Exam: Lungs:  Non labored Incisions:  R neck incision c/d/i; small hematoma superior aspect of incision Extremities:  Moving all extremities well Abdomen:  Soft Neurologic: A&O  CBC    Component Value Date/Time   WBC 4.7 11/26/2017 0509   RBC 2.60 (L) 11/26/2017 0509   HGB 8.5 (L) 11/26/2017 0509   HGB 10.7 (L) 11/19/2017 0818   HGB 14.3 03/03/2017 1000   HGB 14.6 12/17/2010 1056   HCT 25.7 (L) 11/26/2017 0509   HCT 42.7 03/03/2017 1000   HCT 42.1 12/17/2010 1056   PLT 153 11/26/2017 0509   PLT 90 (L) 11/19/2017 0818   PLT 212 03/03/2017 1000   MCV 98.8 11/26/2017 0509   MCV 91 03/03/2017 1000   MCV 93.3 12/17/2010 1056   MCH 32.7 11/26/2017 0509   MCHC 33.1 11/26/2017 0509   RDW 14.5 11/26/2017 0509   RDW 14.2 03/03/2017 1000   RDW 13.4 12/17/2010 1056   LYMPHSABS 0.9 11/19/2017 0818   LYMPHSABS WILL FOLLOW 03/05/2016 1518   LYMPHSABS 1.3 12/17/2010 1056   MONOABS 0.5 11/19/2017 0818   MONOABS 0.6 12/17/2010 1056   EOSABS 0.3 11/19/2017 0818   EOSABS WILL FOLLOW 03/05/2016 1518   BASOSABS 0.0 11/19/2017 0818   BASOSABS WILL FOLLOW 03/05/2016 1518   BASOSABS 0.0 12/17/2010 1056    BMET    Component Value Date/Time   NA 138 11/26/2017 0509   NA 140 03/03/2017 1000   K 4.0 11/26/2017 0509   CL 102 11/26/2017 0509   CO2 26 11/26/2017 0509   GLUCOSE 124 (H) 11/26/2017 0509   BUN 14 11/26/2017 0509   BUN 14 03/03/2017 1000   CREATININE 1.22 11/26/2017 0509   CREATININE 1.13 11/19/2017 0818   CALCIUM 8.8 (L) 11/26/2017 0509   GFRNONAA >60 11/26/2017 0509   GFRNONAA >60 11/19/2017 0818   GFRAA >60 11/26/2017 0509   GFRAA  >60 11/19/2017 0818    INR    Component Value Date/Time   INR 1.07 11/25/2017 0634     Intake/Output Summary (Last 24 hours) at 11/26/2017 0734 Last data filed at 11/25/2017 2358 Gross per 24 hour  Intake 1554.59 ml  Output 2475 ml  Net -920.41 ml     Assessment/Plan:  67 y.o. male is s/p R CEA 1 Day Post-Op   No neuro events overnight Small hematoma but soft and no restrictions per patient [facility] for discharge this morning/afternoon if eating, walking, and voiding Follow up in 2 weeks  Dagoberto Ligas, PA-C [facility] [phone] 11/26/2017 7:34 AM  I have independently interviewed and examined patient and agree with PA assessment and plan above.   Nakeem Murnane C. Donzetta Matters, MD [facility] [place] Office: [phone] Pager: [phone]

## 2017-11-26 NOTE — Telephone Encounter (Signed)
sch appt spk to pt 12/12/17 245pm p/o MD

## 2017-11-26 NOTE — Progress Notes (Signed)
Patient in a stable condition, discharge education reviewed with patient an his family at bedside, he verbalized understanding,patient belongings at bedside, prescriptions given to patient , patient to be transported home by his family.

## 2017-11-26 NOTE — Discharge Instructions (Signed)
   [facility] [place]  Discharge Instructions   Carotid Endarterectomy (CEA)  Please refer to the following instructions for your post-procedure care. Your surgeon or physician assistant will discuss any changes with you.  Activity  You are encouraged to walk as much as you can. You can slowly return to normal activities but must avoid strenuous activity and heavy lifting until your doctor tell you it's OK. Avoid activities such as vacuuming or swinging a golf club. You can drive after one week if you are comfortable and you are no longer taking prescription pain medications. It is normal to feel tired for serval weeks after your surgery. It is also normal to have difficulty with sleep habits, eating, and bowel movements after surgery. These will go away with time.  Bathing/Showering  You may shower after you come home. Do not soak in a bathtub, hot tub, or swim until the incision heals completely.  Incision Care  Shower every day. Clean your incision with mild soap and water. Pat the area dry with a clean towel. You do not need a bandage unless otherwise instructed. Do not apply any ointments or creams to your incision. You may have skin glue on your incision. Do not peel it off. It will come off on its own in about one week. Your incision may feel thickened and raised for several weeks after your surgery. This is normal and the skin will soften over time. For Men Only: It's OK to shave around the incision but do not shave the incision itself for 2 weeks. It is common to have numbness under your chin that could last for several months.  Diet  Resume your normal diet. There are no special food restrictions following this procedure. A low fat/low cholesterol diet is recommended for all patients with vascular disease. In order to heal from your surgery, it is CRITICAL to get adequate nutrition. Your body requires vitamins, minerals, and protein. Vegetables are the best  source of vitamins and minerals. Vegetables also provide the perfect balance of protein. Processed food has little nutritional value, so try to avoid this.        Medications  Resume taking all of your medications unless your doctor or physician assistant tells you not to. If your incision is causing pain, you may take over-the- counter pain relievers such as acetaminophen (Tylenol). If you were prescribed a stronger pain medication, please be aware these medications can cause nausea and constipation. Prevent nausea by taking the medication with a snack or meal. Avoid constipation by drinking plenty of fluids and eating foods with a high amount of fiber, such as fruits, vegetables, and grains. Do not take Tylenol if you are taking prescription pain medications.  Follow Up  Our office will schedule a follow up appointment 2-3 weeks following discharge.  Please call us immediately for any of the following conditions  Increased pain, redness, drainage (pus) from your incision site. Fever of 101 degrees or higher. If you should develop stroke (slurred speech, difficulty swallowing, weakness on one side of your body, loss of vision) you should call 911 and go to the nearest emergency room.  Reduce your risk of vascular disease:  Stop smoking. If you would like help call [facility] at 1-800-QUIT-NOW (1-800-784-8669) or [place] at 336-586-4000. Manage your cholesterol Maintain a desired weight Control your diabetes Keep your blood pressure down  If you have any questions, please call the office at [phone].   

## 2017-11-26 NOTE — Progress Notes (Addendum)
IV removed , tele dc, patient to resume his eliquis tomorrow 11/27/17 per Arbutus Leas PA.

## 2017-11-27 NOTE — Discharge Summary (Signed)
Discharge Summary     Michael Mosley [birth year] 67 y.o. male  [id number]  Admission Date: 11/25/2017  Discharge Date: 11/26/17  Physician: Dr. Donzetta Matters  Admission Diagnosis: right carotid stenosis  Discharge Day services:    see progress note 11/26/17 Physical Exam: Vitals:   11/26/17 0418 11/26/17 0723  BP: 110/60 (!) 158/92  Pulse: 62 63  Resp: 17 16  Temp: 97.6 F (36.4 C) 98 F (36.7 C)  SpO2: 96% 100%    Hospital Course:  The patient was admitted to the hospital and taken to the operating room on 11/25/2017 and underwent Right carotid endarterectomy.  The pt tolerated the procedure well and was transported to the PACU in fair condition.   By POD 1, the pt neuro status remained at baseline.  He did have a small hematoma at the superior aspect of his incision however this was somewhat soft and was not restrictive to neck ROM.  The remainder of the hospital course consisted of increasing mobilization and increasing intake of solids without difficulty.  He will follow up in office in 2 weeks.  He will be prescribed 1-2 days of narcotic pain medication for continued post operative pain control.  Discharge instructions were reviewed with the patient and he voices his understanding.  He was discharged to home in stable condition.   Recent Labs    11/26/17 0509  NA 138  K 4.0  CL 102  CO2 26  GLUCOSE 124*  BUN 14  CALCIUM 8.8*   Recent Labs    11/26/17 0509  WBC 4.7  HGB 8.5*  HCT 25.7*  PLT 153   Recent Labs    11/25/17 0634  INR 1.07       Discharge Diagnosis:  right carotid stenosis  Secondary Diagnosis: Patient Active Problem List   Diagnosis Date Noted  . Carotid artery stenosis 11/25/2017  . Port-A-Cath in place 07/29/2017  . 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 Goals of care, counseling/discussion 06/16/2017  . 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 Sinusitis, chronic 08/18/2014  . Mild persistent chronic  asthma without complication 22/04/5425  . 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 Malignant neoplasm of base of tongue ([place]) 10/09/2011  . Hypothyroidism 10/09/2011   Past Medical History:  Diagnosis Date  . 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 Arthritis    left hip  . Asthma   . Cancer ([place])    tongue cancer s/p chemo/RXT, followed by Dr Sondra Come.   Marland Kitchen Heart murmur   . History of radiation therapy 02/2005   CTV 6810 cGy, PTV 6000 cGy, ETV 227 cGy  . Hyperlipidemia   . Hypertension   . Hypothyroidism   . 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 Thyroid disease   . Umbilical hernia     Allergies as of 11/26/2017      Reactions   Ascorbate Other (See Comments)   Gets cold symptoms   Vitamin C Other (See Comments)   Gets cold symptoms   Albuterol Sulfate Palpitations   Hydrocodone Nausea And Vomiting, Nausea Only      Medication List    TAKE these medications   ALPRAZolam 0.5 MG tablet Commonly known as:  XANAX Take 1 tablet (0.5 mg total) by mouth at bedtime as needed for anxiety. And insomnia. Do not drive while taking. What changed:  reasons to take this   aspirin EC 81 MG tablet  Take 81 mg by mouth daily.   ezetimibe 10 MG tablet Commonly known as:  ZETIA Take 10 mg by mouth daily.   ibuprofen 200 MG tablet Commonly known as:  ADVIL,MOTRIN Take 400 mg by mouth every 6 (six) hours as needed for mild pain.   levothyroxine 75 MCG tablet Commonly known as:  SYNTHROID, LEVOTHROID Take 1 tablet (75 mcg total) by mouth daily.   lidocaine-prilocaine cream Commonly known as:  EMLA Apply to portacath site 1-2 hours prior to use What changed:    how much to take  how to take this  when to take this  reasons to take this  additional instructions   ondansetron 4 MG disintegrating tablet Commonly known as:  ZOFRAN-ODT 4mg  ODT q4 hours prn nausea/vomit   ondansetron 8 MG tablet Commonly known as:   ZOFRAN Take 1 tablet (8 mg total) by mouth every 8 (eight) hours as needed for nausea or vomiting. What changed:  how much to take   ramipril 5 MG capsule Commonly known as:  ALTACE Take 5 mg by mouth every evening.   rosuvastatin 40 MG tablet Commonly known as:  CRESTOR Take 40 mg by mouth every evening.   traMADol 50 MG tablet Commonly known as:  ULTRAM Take 1 tablet (50 mg total) by mouth every 6 (six) hours as needed for moderate pain.   Turmeric 500 MG Caps Take 1,000 mg by mouth daily.   XARELTO 20 MG Tabs tablet Generic drug:  rivaroxaban Take 20 mg by mouth every evening.        Discharge Instructions:   [facility] Discharge Instructions Carotid Endarterectomy (CEA)  Please refer to the following instructions for your post-procedure care. Your surgeon or physician assistant will discuss any changes with you.  Activity  You are encouraged to walk as much as you can. You can slowly return to normal activities but must avoid strenuous activity and heavy lifting until your doctor tell you it's OK. Avoid activities such as vacuuming or swinging a golf club. You can drive after one week if you are comfortable and you are no longer taking prescription pain medications. It is normal to feel tired for serval weeks after your surgery. It is also normal to have difficulty with sleep habits, eating, and bowel movements after surgery. These will go away with time.  Bathing/Showering  You may shower after you come home. Do not soak in a bathtub, hot tub, or swim until the incision heals completely.  Incision Care  Shower every day. Clean your incision with mild soap and water. Pat the area dry with a clean towel. You do not need a bandage unless otherwise instructed. Do not apply any ointments or creams to your incision. You may have skin glue on your incision. Do not peel it off. It will come off on its own in about one week. Your incision may  feel thickened and raised for several weeks after your surgery. This is normal and the skin will soften over time. For Men Only: It's OK to shave around the incision but do not shave the incision itself for 2 weeks. It is common to have numbness under your chin that could last for several months.  Diet  Resume your normal diet. There are no special food restrictions following this procedure. A low fat/low cholesterol diet is recommended for all patients with vascular disease. In order to heal from your surgery, it is CRITICAL to get adequate nutrition. Your body  requires vitamins, minerals, and protein. Vegetables are the best source of vitamins and minerals. Vegetables also provide the perfect balance of protein. Processed food has little nutritional value, so try to avoid this.  Medications  Resume taking all of your medications unless your doctor or physician assistant tells you not to.  If your incision is causing pain, you may take over-the- counter pain relievers such as acetaminophen (Tylenol). If you were prescribed a stronger pain medication, please be aware these medications can cause nausea and constipation.  Prevent nausea by taking the medication with a snack or meal. Avoid constipation by drinking plenty of fluids and eating foods with a high amount of fiber, such as fruits, vegetables, and grains. Do not take Tylenol if you are taking prescription pain medications.  Follow Up  Our office will schedule a follow up appointment 2-3 weeks following discharge.  Please call us immediately for any of the following conditions  Increased pain, redness, drainage (pus) from your incision site. Fever of 101 degrees or higher. If you should develop stroke (slurred speech, difficulty swallowing, weakness on one side of your body, loss of vision) you should call 911 and go to the nearest emergency room.  Reduce your risk of vascular disease:  Stop smoking. If you would like help call [facility]  at 1-800-QUIT-NOW 540-472-3105) or [place] at (585)083-9108. Manage your cholesterol Maintain a desired weight Control your diabetes Keep your blood pressure down  If you have any questions, please call the office at [phone].   Disposition: home  Patient's condition: is Good  Follow up: 1. Dr. Donzetta Matters in 2 weeks.   Dagoberto Ligas, PA-C [facility] [phone]   --- For [facility] Registry use ---   Modified Rankin score at D/C (0-6): 2  IV medication needed for:  1. Hypertension: No 2. Hypotension: No  Post-op Complications: No  1. Post-op CVA or TIA: No  If yes: Event classification (right eye, left eye, right cortical, left cortical, verterobasilar, other):   If yes: Timing of event (intra-op, <6 hrs post-op, >=6 hrs post-op, unknown):   2. CN injury: No  If yes: CN  injuried   3. Myocardial infarction: No  If yes: Dx by (EKG or clinical, Troponin):   4.  CHF: No  5.  Dysrhythmia (new): No  6. Wound infection: No  7. Reperfusion symptoms: No  8. Return to OR: No  If yes: return to OR for (bleeding, neurologic, other CEA incision, other):   Discharge medications: Statin use:  Yes ASA use:  Yes   Beta blocker use:  No ACE-Inhibitor use:  Yes  ARB use:  No CCB use: No P2Y12 Antagonist use: No, [ ]  Plavix, [ ]  Plasugrel, [ ]  Ticlopinine, [ ]  Ticagrelor, [ ]  Other, [ ]  No for medical reason, [ ]  Non-compliant, [ ]  Not-indicated Anti-coagulant use:  Yes, [ ]  Warfarin, [x ] Rivaroxaban, [ ]  Dabigatran,

## 2017-12-02 ENCOUNTER — Inpatient Hospital Stay: Payer: PPO

## 2017-12-02 ENCOUNTER — Inpatient Hospital Stay: Payer: PPO | Admitting: Nurse Practitioner

## 2017-12-03 ENCOUNTER — Inpatient Hospital Stay: Payer: PPO

## 2017-12-12 ENCOUNTER — Encounter: Payer: PPO | Admitting: Vascular Surgery

## 2017-12-15 ENCOUNTER — Ambulatory Visit (HOSPITAL_COMMUNITY)
Admission: RE | Admit: 2017-12-15 | Discharge: 2017-12-15 | Disposition: A | Discharge status: Home / Self Care | Payer: PPO | Source: Ambulatory Visit | Attending: Oncology | Admitting: Oncology

## 2017-12-15 DIAGNOSIS — C221 Intrahepatic bile duct carcinoma: Secondary | ICD-10-CM | POA: Diagnosis not present

## 2017-12-15 DIAGNOSIS — Z5111 Encounter for antineoplastic chemotherapy: Secondary | ICD-10-CM | POA: Diagnosis not present

## 2017-12-15 MED ORDER — SODIUM CHLORIDE 0.9 % IJ SOLN
INTRAMUSCULAR | Status: AC
  Filled 2017-12-15: qty 50

## 2017-12-15 MED ORDER — IOHEXOL 300 MG/ML  SOLN
100.0000 mL | Freq: Once | INTRAMUSCULAR | Status: AC | PRN
  Administered 2017-12-15: 100 mL via INTRAVENOUS

## 2017-12-16 ENCOUNTER — Inpatient Hospital Stay: Payer: PPO

## 2017-12-16 ENCOUNTER — Inpatient Hospital Stay (HOSPITAL_BASED_OUTPATIENT_CLINIC_OR_DEPARTMENT_OTHER): Payer: PPO | Admitting: Nurse Practitioner

## 2017-12-16 ENCOUNTER — Inpatient Hospital Stay: Payer: PPO | Attending: Oncology

## 2017-12-16 ENCOUNTER — Encounter: Payer: Self-pay | Admitting: Nurse Practitioner

## 2017-12-16 VITALS — BP 116/74 | HR 58 | Temp 97.8°F | Resp 17 | Ht 73.0 in | Wt 207.2 lb

## 2017-12-16 DIAGNOSIS — Z5111 Encounter for antineoplastic chemotherapy: Secondary | ICD-10-CM | POA: Diagnosis not present

## 2017-12-16 DIAGNOSIS — C221 Intrahepatic bile duct carcinoma: Secondary | ICD-10-CM

## 2017-12-16 DIAGNOSIS — Z95828 Presence of other vascular implants and grafts: Secondary | ICD-10-CM

## 2017-12-16 LAB — CMP (CANCER CENTER ONLY)
ALBUMIN: 3.5 g/dL (ref 3.5–5.0)
ALT: 10 U/L (ref 0–44)
AST: 17 U/L (ref 15–41)
Alkaline Phosphatase: 70 U/L (ref 38–126)
Anion gap: 9 (ref 5–15)
BILIRUBIN TOTAL: 0.3 mg/dL (ref 0.3–1.2)
BUN: 11 mg/dL (ref 8–23)
CHLORIDE: 102 mmol/L (ref 98–111)
CO2: 29 mmol/L (ref 22–32)
CREATININE: 1.1 mg/dL (ref 0.61–1.24)
Calcium: 9.2 mg/dL (ref 8.9–10.3)
GFR, Est AFR Am: >60 mL/min (ref >60)
GLUCOSE: 153 mg/dL — AB (ref 70–99)
Potassium: 3.9 mmol/L (ref 3.5–5.1)
Sodium: 140 mmol/L (ref 135–145)
TOTAL PROTEIN: 7.1 g/dL (ref 6.5–8.1)

## 2017-12-16 LAB — CBC WITH DIFFERENTIAL (CANCER CENTER ONLY)
Basophils Absolute: 0 10*3/uL (ref 0.0–0.1)
Basophils Relative: 0 %
EOS PCT: 10 %
Eosinophils Absolute: 0.5 10*3/uL (ref 0.0–0.5)
HEMATOCRIT: 29.1 % — AB (ref 38.4–49.9)
Hemoglobin: 9.3 g/dL — ABNORMAL LOW (ref 13.0–17.0)
LYMPHS PCT: 20 %
Lymphs Abs: 1 10*3/uL (ref 0.9–3.3)
MCH: 31 pg (ref 27.2–33.4)
MCHC: 32 g/dL (ref 32.0–36.0)
MCV: 97 fL (ref 79.3–98.0)
MONOS PCT: 8 %
Monocytes Absolute: 0.4 10*3/uL (ref 0.1–0.9)
Neutro Abs: 3 10*3/uL (ref 1.5–6.5)
Neutrophils Relative %: 62 %
PLATELETS: 153 10*3/uL (ref 150–400)
RBC: 3 MIL/uL — AB (ref 4.20–5.82)
RDW: 13.9 % (ref 11.0–14.6)
WBC: 4.8 10*3/uL (ref 4.0–10.5)
nRBC: 0 % (ref 0.0–0.2)

## 2017-12-16 LAB — MAGNESIUM: Magnesium: 1.9 mg/dL (ref 1.7–2.4)

## 2017-12-16 MED ORDER — POTASSIUM CHLORIDE 2 MEQ/ML IV SOLN
Freq: Once | INTRAVENOUS | Status: AC
  Administered 2017-12-16: 10:00:00 via INTRAVENOUS
  Filled 2017-12-16: qty 10

## 2017-12-16 MED ORDER — SODIUM CHLORIDE 0.9% FLUSH
10.0000 mL | INTRAVENOUS | Status: DC | PRN
  Administered 2017-12-16: 10 mL
  Filled 2017-12-16: qty 10

## 2017-12-16 MED ORDER — SODIUM CHLORIDE 0.9 % IV SOLN
Freq: Once | INTRAVENOUS | Status: AC
  Administered 2017-12-16: 10:00:00 via INTRAVENOUS
  Filled 2017-12-16: qty 250

## 2017-12-16 MED ORDER — SODIUM CHLORIDE 0.9 % IV SOLN
800.0000 mg/m2 | Freq: Once | INTRAVENOUS | Status: AC
  Administered 2017-12-16: 1710 mg via INTRAVENOUS
  Filled 2017-12-16: qty 44.97

## 2017-12-16 MED ORDER — PALONOSETRON HCL INJECTION 0.25 MG/5ML
0.2500 mg | Freq: Once | INTRAVENOUS | Status: AC
  Administered 2017-12-16: 0.25 mg via INTRAVENOUS

## 2017-12-16 MED ORDER — HEPARIN SOD (PORK) LOCK FLUSH 100 UNIT/ML IV SOLN
500.0000 [IU] | Freq: Once | INTRAVENOUS | Status: AC | PRN
  Administered 2017-12-16: 500 [IU]
  Filled 2017-12-16: qty 5

## 2017-12-16 MED ORDER — PALONOSETRON HCL INJECTION 0.25 MG/5ML
INTRAVENOUS | Status: AC
  Filled 2017-12-16: qty 5

## 2017-12-16 MED ORDER — SODIUM CHLORIDE 0.9 % IV SOLN
25.0000 mg/m2 | Freq: Once | INTRAVENOUS | Status: AC
  Administered 2017-12-16: 54 mg via INTRAVENOUS
  Filled 2017-12-16: qty 54

## 2017-12-16 MED ORDER — SODIUM CHLORIDE 0.9 % IV SOLN
Freq: Once | INTRAVENOUS | Status: AC
  Administered 2017-12-16: 12:00:00 via INTRAVENOUS
  Filled 2017-12-16: qty 5

## 2017-12-16 NOTE — Patient Instructions (Signed)
[facility] Discharge Instructions for Patients Receiving Chemotherapy  Today you received the following chemotherapy agents Gemzar, Cisplatin  To help prevent nausea and vomiting after your treatment, we encourage you to take your nausea medication as directed   If you develop nausea and vomiting that is not controlled by your nausea medication, call the clinic.   BELOW ARE SYMPTOMS THAT SHOULD BE REPORTED IMMEDIATELY:  *FEVER GREATER THAN 100.5 F  *CHILLS WITH OR WITHOUT FEVER  NAUSEA AND VOMITING THAT IS NOT CONTROLLED WITH YOUR NAUSEA MEDICATION  *UNUSUAL SHORTNESS OF BREATH  *UNUSUAL BRUISING OR BLEEDING  TENDERNESS IN MOUTH AND THROAT WITH OR WITHOUT PRESENCE OF ULCERS  *URINARY PROBLEMS  *BOWEL PROBLEMS  UNUSUAL RASH Items with * indicate a potential emergency and should be followed up as soon as possible.  Feel free to call the clinic should you have any questions or concerns. The clinic phone number is [phone].  Please show the CHEMO ALERT CARD at check-in to the Emergency Department and triage nurse.   

## 2017-12-16 NOTE — Progress Notes (Addendum)
[facility] OFFICE PROGRESS NOTE   Diagnosis: Cholangiocarcinoma  INTERVAL HISTORY:   Mr. Sandate returns for follow-up.  He was last treated with gemcitabine/cisplatin 11/05/2017.  He underwent a right carotid endarterectomy 11/25/2017.  He feels he is slowly recovering from the recent surgery.  No nausea or vomiting.  No mouth sores.  No diarrhea.  No numbness or tingling in his hands or feet.  No fever or rash.  He denies abdominal pain.  Appetite is described as "fine".  Objective:  Vital signs in last 24 hours:  Blood pressure 116/74, pulse (!) 58, temperature 97.8 F (36.6 C), temperature source Oral, resp. rate 17, height '6\' 1"'  (1.854 m), weight 207 lb 3.2 oz (94 kg), SpO2 98 %.    HEENT: No thrush or ulcers. Resp: Lungs clear bilaterally. Cardio: Regular rate and rhythm.  2/6 systolic murmur. GI: Abdomen soft and nontender.  No hepatomegaly. Vascular: No leg edema. Neuro: Alert and oriented.  Port-A-Cath without erythema.  Lab Results:  Lab Results  Component Value Date   WBC 4.8 12/16/2017   HGB 9.3 (L) 12/16/2017   HCT 29.1 (L) 12/16/2017   MCV 97.0 12/16/2017   PLT 153 12/16/2017   NEUTROABS 3.0 12/16/2017    Imaging:  Ct Abdomen Pelvis W Contrast  Result Date: 12/15/2017 CLINICAL DATA:  Cholangiocarcinoma since 3/19; hx liver bx. Chemo ongoing. OMNI 300/161m, no reactions noted.^1069mOMNIPAQUE IOHEXOL 300 MG/ML SOLN EXAM: CT ABDOMEN AND PELVIS WITH CONTRAST TECHNIQUE: Multidetector CT imaging of the abdomen and pelvis was performed using the standard protocol following bolus administration of intravenous contrast. CONTRAST:  10070mMNIPAQUE IOHEXOL 300 MG/ML  SOLN COMPARISON:  CT 09/15/2017 FINDINGS: Lower chest: Lung bases are clear. Hepatobiliary: Segmental lesion in the lateral LEFT hepatic lobe measures 6.1 by 4.2 cm compared to 7.2 x 5.5 cm. Visually lesion appears very similar. In the RIGHT hepatic lobe (segment 7), round low-density  lesion measuring 12 mm (image 21/2) is not clearly seen on prior exam Second smaller RIGHT hepatic lobe low-density lesion measuring approximately 5 mm (image 16/2) is decreased from 9 mm on comparison exam. Common bile duct normal caliber.  Gallbladder normal. Abdominal aortic normal caliber. Pancreas: Pancreas is normal. No ductal dilatation. No pancreatic inflammation. Spleen: Normal spleen Adrenals/urinary tract: Adrenal glands and kidneys are normal. The ureters and bladder normal. Stomach/Bowel: Stomach, small-bowel, cecum normal. The colon and rectosigmoid colon are normal. Vascular/Lymphatic: Abdominal aorta is normal caliber. Enlarged lymph nodes in the gastrohepatic space. Largest node is decreased in size measuring 15 mm (image 17/2) compared to 27 mm. Second lymph node superior to the LEFT adrenal gland measures 19 mm compared to 17 mm. No pelvic lymphadenopathy. Reproductive: Prostate normal Other: No peritoneal metastasis.  Small ventral umbilical hernia. Musculoskeletal: No aggressive osseous lesion. IMPRESSION: 1. Dominant mass lesion in the LEFT lateral hepatic lobe measures slightly smaller and visually appears very similar. 2. New round lesion in the RIGHT hepatic lobe is concerning for a new focus of metastatic disease. 3. Previous described metastatic deposit in the RIGHT hepatic lobe is decreased in size. 4. Overall decrease in size of gastrohepatic ligament lymph nodes although one 1 lymph node does measure slightly greater. Electronically Signed   By: SteSuzy BouchardD.   On: 12/15/2017 08:49    Medications: I have reviewed the patient's current medications.  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   CT abdomen/pelvis  on 04/21/2017-7 cm mass lesion in the lateral segment of the left liver associated with smaller lesions in  the anterior right liver and medial lobe left liver. This was associated with necrotic lymphadenopathy in the gastrohepatic and hepatoduodenal ligaments.  04/25/2017 status post upper endoscopy and colonoscopy.   On the upper endoscopy he was found to have 1 moderate benign-appearing intrinsic stenosis at 40 cm from the incisors. This was dilated. The exam of the esophagus was otherwise normal. The entire examined stomach and duodenum also normal.   On the colonoscopy he was found to have a 5 mm polyp in the cecum which was removed. Pathology showed a serrated polyp with features of a sessile serrated polyp; negative for cytologic dysplasia. He was noted to have many small and large mouth diverticula in the sigmoid colon and descending colon. Terminal ileum appeared normal. Internal hemorrhoids were found as well.   04/28/2017 status post biopsy of a liver lesion. Pathology showed adenocarcinoma positive for cytokeratin 7 (strong), CDX-2 (weak) and focal TTF-1. Cytokeratin 20 essentially negative.  CT chest 05/12/2017-no evidence of metastatic disease or a primary chest tumor  oundation1-microsatellite stable; tumor mutational burden 6 Muts/Mb; CDKN28 loss  Cycle 1 gemcitabine/cisplatin 07/01/2017  Cycle 2 gemcitabine/cisplatin 07/15/2017  Cycle 3 gemcitabine/cisplatin 07/29/2017  Cycle 4 gemcitabine/cisplatin 08/12/2017  Cycle 5 gemcitabine/cisplatin 08/26/2017  CT 09/15/2017- decreased size of dominant and smaller hepatic lesions, improved upper abdominal lymphadenopathy  Cycle 6 gemcitabine/cisplatin 09/16/2017  Cycle 7 gemcitabine/cisplatin 09/29/2017  Cycle 8 gemcitabine/cisplatin 10/15/2017  Cycle 9 gemcitabine/cisplatin 11/05/2017  CT abdomen/pelvis 12/15/2017-dominant mass lesion in the left lateral hepatic lobe measures slightly smaller and visually appears very similar.  New round lesion in the right hepatic lobe concerning for a new focus of metastatic disease.  Previous  described metastatic deposit in the right hepatic lobe is decreased in size.  Overall decrease in size of gastrohepatic ligament lymph nodes although one lymph node does measure slightly larger.  Cycle 10 gemcitabine/cisplatin 12/16/2017 2. Weight loss-improved 3. 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 Alen Blew) 4. Asthma 5. Hypertension 6. Hyperlipidemia 7. Hypothyroid 8. 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  Thrombocytopenia secondary to chemotherapy    Disposition: Mr. Eisner appears stable.  He has completed 9 cycles of gemcitabine/Abraxane.  The recent restaging CTs show overall stable disease.  The plan is to continue treatment with gemcitabine/Abraxane but adjust to a 3-week schedule beginning today.  He will return for lab, follow-up and the next cycle of gemcitabine/cisplatin on 01/06/2018.  He will contact the office in the interim with any problems.  Patient seen with Dr. Benay Spice.  CT images reviewed on the computer with Mr. Wuertz.  25 minutes were spent face-to-face at today's visit with the majority of that time involved in counseling/coordination of care.    Ned Card ANP/GNP-BC   12/16/2017  9:03 AM  This was a shared visit with Ned Card.  We reviewed the restaging CT images with Mr. Gavitt.  The overall pattern is stable disease.  We discussed treatment options.  This included a discussion of a treatment break, continuing treatment on the current schedule, or adjusting to a longer treatment interval.  We decided to continue gemcitabine/cisplatin every 3 weeks.  Julieanne Manson, MD

## 2017-12-17 ENCOUNTER — Telehealth: Payer: Self-pay | Admitting: Oncology

## 2017-12-17 NOTE — Telephone Encounter (Signed)
Scheduled appt per 10/8 los - pt is aware of appt date and time.

## 2017-12-26 ENCOUNTER — Other Ambulatory Visit: Payer: Self-pay

## 2017-12-26 ENCOUNTER — Encounter: Payer: Self-pay | Admitting: Vascular Surgery

## 2017-12-26 ENCOUNTER — Ambulatory Visit (INDEPENDENT_AMBULATORY_CARE_PROVIDER_SITE_OTHER): Payer: PPO | Admitting: Vascular Surgery

## 2017-12-26 VITALS — BP 112/75 | HR 57 | Temp 97.2°F | Resp 18 | Ht 73.0 in | Wt 208.4 lb

## 2017-12-26 DIAGNOSIS — I6523 Occlusion and stenosis of bilateral carotid arteries: Secondary | ICD-10-CM

## 2017-12-26 NOTE — Progress Notes (Signed)
    Subjective:     Patient ID: Michael Mosley, male   DOB: Oct 12, 1950, 67 y.o.   MRN: [id number]  HPI 67 year old male follows up after right carotid endarterectomy.  He is still feeling weak but has not had any neurologic issues.  He is back to playing some golf at this time.  Recently underwent CT scan to evaluate his cholangiocarcinoma and he states that he got a good report from this.  He is now receiving chemotherapy every 3 weeks.  He is on Xarelto and aspirin daily.   Review of Systems General fatigue    Objective:   Physical Exam Awake alert oriented Cranial nerves intact Moving all extremities well without limitation Right neck incision healing well    Assessment/plan     67 year old male status post right carotid endarterectomy that was difficult given his previous radiation.  He also has high-grade stenosis on the left side currently undergoing chemotherapy for cholangiocarcinoma and feeling weak.  We will have him follow-up in 6 weeks to discuss intervention on the left which would likely be trans-carotid stenting given that he has a history of radiation which made the other side very difficult.  If he has any neurologic issues in the interim which we discussed the signs and symptoms of stroke today that he will seek emergent medical therapy otherwise we will see him in 6 weeks to discuss repair.    Britanny Marksberry C. Donzetta Matters, MD [facility] [place] Office: [phone] Pager: [phone]

## 2018-01-06 ENCOUNTER — Inpatient Hospital Stay (HOSPITAL_BASED_OUTPATIENT_CLINIC_OR_DEPARTMENT_OTHER): Payer: PPO | Admitting: Oncology

## 2018-01-06 ENCOUNTER — Inpatient Hospital Stay: Payer: PPO

## 2018-01-06 VITALS — BP 118/68 | HR 75 | Temp 97.8°F | Resp 18 | Ht 73.0 in | Wt 205.9 lb

## 2018-01-06 DIAGNOSIS — C221 Intrahepatic bile duct carcinoma: Secondary | ICD-10-CM

## 2018-01-06 DIAGNOSIS — Z95828 Presence of other vascular implants and grafts: Secondary | ICD-10-CM

## 2018-01-06 DIAGNOSIS — Z5111 Encounter for antineoplastic chemotherapy: Secondary | ICD-10-CM | POA: Diagnosis not present

## 2018-01-06 LAB — CMP (CANCER CENTER ONLY)
ALK PHOS: 70 U/L (ref 38–126)
ALT: 10 U/L (ref 0–44)
ANION GAP: 8 (ref 5–15)
AST: 19 U/L (ref 15–41)
Albumin: 3.5 g/dL (ref 3.5–5.0)
BUN: 13 mg/dL (ref 8–23)
CALCIUM: 9 mg/dL (ref 8.9–10.3)
CO2: 28 mmol/L (ref 22–32)
CREATININE: 1.37 mg/dL — AB (ref 0.61–1.24)
Chloride: 102 mmol/L (ref 98–111)
GFR, EST NON AFRICAN AMERICAN: 52 mL/min — AB (ref >60)
GFR, Est AFR Am: >60 mL/min (ref >60)
Glucose, Bld: 135 mg/dL — ABNORMAL HIGH (ref 70–99)
Potassium: 3.9 mmol/L (ref 3.5–5.1)
Sodium: 138 mmol/L (ref 135–145)
Total Bilirubin: 0.5 mg/dL (ref 0.3–1.2)
Total Protein: 7 g/dL (ref 6.5–8.1)

## 2018-01-06 LAB — CBC WITH DIFFERENTIAL (CANCER CENTER ONLY)
ABS IMMATURE GRANULOCYTES: 0.02 10*3/uL (ref 0.00–0.07)
BASOS PCT: 1 %
Basophils Absolute: 0 10*3/uL (ref 0.0–0.1)
EOS PCT: 7 %
Eosinophils Absolute: 0.2 10*3/uL (ref 0.0–0.5)
HCT: 27.3 % — ABNORMAL LOW (ref 39.0–52.0)
HEMOGLOBIN: 8.8 g/dL — AB (ref 13.0–17.0)
Immature Granulocytes: 1 %
Lymphocytes Relative: 20 %
Lymphs Abs: 0.7 10*3/uL (ref 0.7–4.0)
MCH: 30.7 pg (ref 26.0–34.0)
MCHC: 32.2 g/dL (ref 30.0–36.0)
MCV: 95.1 fL (ref 80.0–100.0)
MONO ABS: 0.6 10*3/uL (ref 0.1–1.0)
MONOS PCT: 16 %
NEUTROS ABS: 2 10*3/uL (ref 1.7–7.7)
Neutrophils Relative %: 55 %
PLATELETS: 186 10*3/uL (ref 150–400)
RBC: 2.87 MIL/uL — AB (ref 4.22–5.81)
RDW: 14.2 % (ref 11.5–15.5)
WBC: 3.5 10*3/uL — AB (ref 4.0–10.5)
nRBC: 0 % (ref 0.0–0.2)

## 2018-01-06 LAB — MAGNESIUM: MAGNESIUM: 1.9 mg/dL (ref 1.7–2.4)

## 2018-01-06 MED ORDER — SODIUM CHLORIDE 0.9% FLUSH
10.0000 mL | INTRAVENOUS | Status: DC | PRN
  Administered 2018-01-06: 10 mL
  Filled 2018-01-06: qty 10

## 2018-01-06 MED ORDER — POTASSIUM CHLORIDE 2 MEQ/ML IV SOLN
Freq: Once | INTRAVENOUS | Status: AC
  Administered 2018-01-06: 11:00:00 via INTRAVENOUS
  Filled 2018-01-06: qty 10

## 2018-01-06 MED ORDER — SODIUM CHLORIDE 0.9 % IV SOLN
Freq: Once | INTRAVENOUS | Status: AC
  Administered 2018-01-06: 13:00:00 via INTRAVENOUS
  Filled 2018-01-06: qty 5

## 2018-01-06 MED ORDER — HEPARIN SOD (PORK) LOCK FLUSH 100 UNIT/ML IV SOLN
500.0000 [IU] | Freq: Once | INTRAVENOUS | Status: AC | PRN
  Administered 2018-01-06: 500 [IU]
  Filled 2018-01-06: qty 5

## 2018-01-06 MED ORDER — SODIUM CHLORIDE 0.9 % IV SOLN
Freq: Once | INTRAVENOUS | Status: AC
  Administered 2018-01-06: 10:00:00 via INTRAVENOUS
  Filled 2018-01-06: qty 250

## 2018-01-06 MED ORDER — PALONOSETRON HCL INJECTION 0.25 MG/5ML
INTRAVENOUS | Status: AC
  Filled 2018-01-06: qty 5

## 2018-01-06 MED ORDER — SODIUM CHLORIDE 0.9 % IV SOLN
20.0000 mg/m2 | Freq: Once | INTRAVENOUS | Status: AC
  Administered 2018-01-06: 43 mg via INTRAVENOUS
  Filled 2018-01-06: qty 43

## 2018-01-06 MED ORDER — SODIUM CHLORIDE 0.9 % IV SOLN
800.0000 mg/m2 | Freq: Once | INTRAVENOUS | Status: AC
  Administered 2018-01-06: 1710 mg via INTRAVENOUS
  Filled 2018-01-06: qty 44.97

## 2018-01-06 MED ORDER — PALONOSETRON HCL INJECTION 0.25 MG/5ML
0.2500 mg | Freq: Once | INTRAVENOUS | Status: AC
  Administered 2018-01-06: 0.25 mg via INTRAVENOUS

## 2018-01-06 NOTE — Progress Notes (Signed)
[facility] OFFICE PROGRESS NOTE   Diagnosis: Cholangiocarcinoma  INTERVAL HISTORY:   Mr. Michael Mosley returns as scheduled.  He completed another treatment with gemcitabine/cisplatin on 12/16/2017.  He tolerated the treatment well.  No nausea or neuropathy symptoms.  No pain.  He reports malaise.  He continues Xarelto anticoagulation.  No bleeding.  Objective:  Vital signs in last 24 hours:  Blood pressure 118/68, pulse 75, temperature 97.8 F (36.6 C), temperature source Oral, resp. rate 18, height '6\' 1"'  (1.854 m), weight 205 lb 14.4 oz (93.4 kg), SpO2 99 %.    HEENT: No thrush or ulcers Resp: Lungs clear bilaterally Cardio: Regular rate and rhythm GI: No hepatomegaly, nontender Vascular: No leg edema   Portacath/PICC-without erythema  Lab Results:  Lab Results  Component Value Date   WBC 3.5 (L) 01/06/2018   HGB 8.8 (L) 01/06/2018   HCT 27.3 (L) 01/06/2018   MCV 95.1 01/06/2018   PLT 186 01/06/2018   NEUTROABS 2.0 01/06/2018    CMP  Lab Results  Component Value Date   NA 138 01/06/2018   K 3.9 01/06/2018   CL 102 01/06/2018   CO2 28 01/06/2018   GLUCOSE 135 (H) 01/06/2018   BUN 13 01/06/2018   CREATININE 1.37 (H) 01/06/2018   CALCIUM 9.0 01/06/2018   PROT 7.0 01/06/2018   ALBUMIN 3.5 01/06/2018   AST 19 01/06/2018   ALT 10 01/06/2018   ALKPHOS 70 01/06/2018   BILITOT 0.5 01/06/2018   GFRNONAA 52 (L) 01/06/2018   GFRAA >60 01/06/2018    Lab Results  Component Value Date   CEA1 3.50 05/09/2017    Lab Results  Component Value Date   INR 1.07 11/25/2017    Imaging:  No results found.  Medications: I have reviewed the patient's current medications.   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   CT abdomen/pelvis on 04/21/2017-7 cm mass lesion in the lateral segment of the  left liver associated with smaller lesions in the anterior right liver and medial lobe left liver. This was associated with necrotic lymphadenopathy in the gastrohepatic and hepatoduodenal ligaments.  04/25/2017 status post upper endoscopy and colonoscopy.   On the upper endoscopy he was found to have 1 moderate benign-appearing intrinsic stenosis at 40 cm from the incisors. This was dilated. The exam of the esophagus was otherwise normal. The entire examined stomach and duodenum also normal.   On the colonoscopy he was found to have a 5 mm polyp in the cecum which was removed. Pathology showed a serrated polyp with features of a sessile serrated polyp; negative for cytologic dysplasia. He was noted to have many small and large mouth diverticula in the sigmoid colon and descending colon. Terminal ileum appeared normal. Internal hemorrhoids were found as well.   04/28/2017 status post biopsy of a liver lesion. Pathology showed adenocarcinoma positive for cytokeratin 7 (strong), CDX-2 (weak) and focal TTF-1. Cytokeratin 20 essentially negative.  CT chest 05/12/2017-no evidence of metastatic disease or a primary chest tumor  oundation1-microsatellite stable; tumor mutational burden 6 Muts/Mb; WUJW11 loss  Cycle 1 gemcitabine/cisplatin 07/01/2017  Cycle 2 gemcitabine/cisplatin 07/15/2017  Cycle 3 gemcitabine/cisplatin 07/29/2017  Cycle 4 gemcitabine/cisplatin 08/12/2017  Cycle 5 gemcitabine/cisplatin 08/26/2017  CT 09/15/2017- decreased size of dominant and smaller hepatic lesions, improved upper abdominal lymphadenopathy  Cycle 6 gemcitabine/cisplatin 09/16/2017  Cycle 7 gemcitabine/cisplatin 09/29/2017  Cycle 8 gemcitabine/cisplatin 10/15/2017  Cycle 9 gemcitabine/cisplatin  11/05/2017  CT abdomen/pelvis 12/15/2017-dominant mass lesion in the left lateral hepatic lobe measures slightly smaller and visually appears very similar.  New round lesion in the right hepatic lobe concerning for a  new focus of metastatic disease.  Previous described metastatic deposit in the right hepatic lobe is decreased in size.  Overall decrease in size of gastrohepatic ligament lymph nodes although one lymph node does measure slightly larger.  Cycle 10 gemcitabine/cisplatin 12/16/2017  Cycle 11 gemcitabine/cisplatin 01/06/2018 (cisplatin dose reduced secondary to a mild increase in the creatinine) 2. Weight loss-improved 3. 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 Alen Blew) 4. Asthma 5. Hypertension 6. Hyperlipidemia 7. Hypothyroid 8. 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 Nawrot appears unchanged.  He is tolerating the chemotherapy well.  The pain is slightly increased today.  I dose reduced the cisplatin.  He will push fluids for the next several days.  He will hold ramipril.  I will asked him to return for a chemistry panel in 1 week.  He will return for an office visit in the next cycle of chemotherapy in 3 weeks.  25 minutes were spent with the patient today.  The majority of the time was used for counseling and coordination of care.  Betsy Coder, MD  01/06/2018  9:59 AM

## 2018-01-06 NOTE — Patient Instructions (Signed)
[facility] Discharge Instructions for Patients Receiving Chemotherapy  Today you received the following chemotherapy agents:  Cisplatin, Gemzar  To help prevent nausea and vomiting after your treatment, we encourage you to take your nausea medication as prescribed.   If you develop nausea and vomiting that is not controlled by your nausea medication, call the clinic.   BELOW ARE SYMPTOMS THAT SHOULD BE REPORTED IMMEDIATELY:  *FEVER GREATER THAN 100.5 F  *CHILLS WITH OR WITHOUT FEVER  NAUSEA AND VOMITING THAT IS NOT CONTROLLED WITH YOUR NAUSEA MEDICATION  *UNUSUAL SHORTNESS OF BREATH  *UNUSUAL BRUISING OR BLEEDING  TENDERNESS IN MOUTH AND THROAT WITH OR WITHOUT PRESENCE OF ULCERS  *URINARY PROBLEMS  *BOWEL PROBLEMS  UNUSUAL RASH Items with * indicate a potential emergency and should be followed up as soon as possible.  Feel free to call the clinic should you have any questions or concerns. The clinic phone number is [phone].  Please show the [facility] at check-in to the Emergency Department and triage nurse.

## 2018-01-06 NOTE — Progress Notes (Signed)
Per Dr Benay Spice OK for treatment with HGB of 8.8

## 2018-01-07 LAB — CANCER ANTIGEN 19-9: CA 19-9: 54 U/mL — ABNORMAL HIGH (ref 0–35)

## 2018-01-08 ENCOUNTER — Telehealth: Payer: Self-pay | Admitting: Oncology

## 2018-01-08 ENCOUNTER — Telehealth: Payer: Self-pay | Admitting: *Deleted

## 2018-01-08 NOTE — Telephone Encounter (Signed)
Patient call returned. He needed clarification on which medication to stop taking. Office noted reviewed. Patient advised to stop taking Ramipril for the time being. Patient verbalized an understanding.

## 2018-01-08 NOTE — Telephone Encounter (Signed)
Scheduled appt per [place] los - pt is aware of appt date and time - reminder letter sent in the mail.

## 2018-01-13 ENCOUNTER — Inpatient Hospital Stay: Payer: PPO | Attending: Oncology

## 2018-01-13 DIAGNOSIS — R634 Abnormal weight loss: Secondary | ICD-10-CM | POA: Insufficient documentation

## 2018-01-13 DIAGNOSIS — Z5111 Encounter for antineoplastic chemotherapy: Secondary | ICD-10-CM | POA: Insufficient documentation

## 2018-01-13 DIAGNOSIS — C221 Intrahepatic bile duct carcinoma: Secondary | ICD-10-CM | POA: Insufficient documentation

## 2018-01-13 DIAGNOSIS — Z923 Personal history of irradiation: Secondary | ICD-10-CM | POA: Insufficient documentation

## 2018-01-13 DIAGNOSIS — Z9221 Personal history of antineoplastic chemotherapy: Secondary | ICD-10-CM | POA: Insufficient documentation

## 2018-01-13 DIAGNOSIS — I739 Peripheral vascular disease, unspecified: Secondary | ICD-10-CM | POA: Insufficient documentation

## 2018-01-13 DIAGNOSIS — E039 Hypothyroidism, unspecified: Secondary | ICD-10-CM | POA: Insufficient documentation

## 2018-01-13 DIAGNOSIS — E785 Hyperlipidemia, unspecified: Secondary | ICD-10-CM | POA: Insufficient documentation

## 2018-01-13 DIAGNOSIS — J45909 Unspecified asthma, uncomplicated: Secondary | ICD-10-CM | POA: Insufficient documentation

## 2018-01-13 DIAGNOSIS — I10 Essential (primary) hypertension: Secondary | ICD-10-CM | POA: Insufficient documentation

## 2018-01-13 DIAGNOSIS — R682 Dry mouth, unspecified: Secondary | ICD-10-CM | POA: Insufficient documentation

## 2018-01-21 DIAGNOSIS — C220 Liver cell carcinoma: Secondary | ICD-10-CM | POA: Diagnosis not present

## 2018-01-21 DIAGNOSIS — Z9889 Other specified postprocedural states: Secondary | ICD-10-CM | POA: Diagnosis not present

## 2018-01-21 DIAGNOSIS — E7800 Pure hypercholesterolemia, unspecified: Secondary | ICD-10-CM | POA: Diagnosis not present

## 2018-01-21 DIAGNOSIS — I25118 Atherosclerotic heart disease of native coronary artery with other forms of angina pectoris: Secondary | ICD-10-CM | POA: Diagnosis not present

## 2018-01-21 DIAGNOSIS — I6523 Occlusion and stenosis of bilateral carotid arteries: Secondary | ICD-10-CM | POA: Diagnosis not present

## 2018-01-27 ENCOUNTER — Inpatient Hospital Stay: Payer: PPO

## 2018-01-27 ENCOUNTER — Encounter: Payer: Self-pay | Admitting: Nurse Practitioner

## 2018-01-27 ENCOUNTER — Inpatient Hospital Stay (HOSPITAL_BASED_OUTPATIENT_CLINIC_OR_DEPARTMENT_OTHER): Payer: PPO | Admitting: Nurse Practitioner

## 2018-01-27 VITALS — BP 129/84 | HR 75 | Temp 98.2°F | Resp 17 | Ht 73.0 in | Wt 194.0 lb

## 2018-01-27 DIAGNOSIS — I739 Peripheral vascular disease, unspecified: Secondary | ICD-10-CM

## 2018-01-27 DIAGNOSIS — Z5111 Encounter for antineoplastic chemotherapy: Secondary | ICD-10-CM | POA: Diagnosis not present

## 2018-01-27 DIAGNOSIS — Z9221 Personal history of antineoplastic chemotherapy: Secondary | ICD-10-CM

## 2018-01-27 DIAGNOSIS — E785 Hyperlipidemia, unspecified: Secondary | ICD-10-CM

## 2018-01-27 DIAGNOSIS — C221 Intrahepatic bile duct carcinoma: Secondary | ICD-10-CM | POA: Diagnosis not present

## 2018-01-27 DIAGNOSIS — R682 Dry mouth, unspecified: Secondary | ICD-10-CM | POA: Diagnosis not present

## 2018-01-27 DIAGNOSIS — Z95828 Presence of other vascular implants and grafts: Secondary | ICD-10-CM

## 2018-01-27 DIAGNOSIS — R634 Abnormal weight loss: Secondary | ICD-10-CM

## 2018-01-27 DIAGNOSIS — I10 Essential (primary) hypertension: Secondary | ICD-10-CM | POA: Diagnosis not present

## 2018-01-27 DIAGNOSIS — J45909 Unspecified asthma, uncomplicated: Secondary | ICD-10-CM | POA: Diagnosis not present

## 2018-01-27 DIAGNOSIS — Z923 Personal history of irradiation: Secondary | ICD-10-CM

## 2018-01-27 DIAGNOSIS — E039 Hypothyroidism, unspecified: Secondary | ICD-10-CM | POA: Diagnosis not present

## 2018-01-27 LAB — CBC WITH DIFFERENTIAL (CANCER CENTER ONLY)
Abs Immature Granulocytes: 0.02 10*3/uL (ref 0.00–0.07)
Basophils Absolute: 0 10*3/uL (ref 0.0–0.1)
Basophils Relative: 1 %
EOS PCT: 6 %
Eosinophils Absolute: 0.3 10*3/uL (ref 0.0–0.5)
HEMATOCRIT: 28.1 % — AB (ref 39.0–52.0)
Hemoglobin: 9 g/dL — ABNORMAL LOW (ref 13.0–17.0)
Immature Granulocytes: 0 %
LYMPHS ABS: 0.9 10*3/uL (ref 0.7–4.0)
Lymphocytes Relative: 18 %
MCH: 29 pg (ref 26.0–34.0)
MCHC: 32 g/dL (ref 30.0–36.0)
MCV: 90.6 fL (ref 80.0–100.0)
MONO ABS: 0.8 10*3/uL (ref 0.1–1.0)
MONOS PCT: 17 %
Neutro Abs: 2.9 10*3/uL (ref 1.7–7.7)
Neutrophils Relative %: 58 %
Platelet Count: 250 10*3/uL (ref 150–400)
RBC: 3.1 MIL/uL — ABNORMAL LOW (ref 4.22–5.81)
RDW: 14.5 % (ref 11.5–15.5)
WBC Count: 4.9 10*3/uL (ref 4.0–10.5)
nRBC: 0 % (ref 0.0–0.2)

## 2018-01-27 LAB — CMP (CANCER CENTER ONLY)
ALK PHOS: 75 U/L (ref 38–126)
ALT: 11 U/L (ref 0–44)
ANION GAP: 10 (ref 5–15)
AST: 28 U/L (ref 15–41)
Albumin: 3.5 g/dL (ref 3.5–5.0)
BILIRUBIN TOTAL: 0.5 mg/dL (ref 0.3–1.2)
BUN: 12 mg/dL (ref 8–23)
CALCIUM: 9.9 mg/dL (ref 8.9–10.3)
CO2: 28 mmol/L (ref 22–32)
Chloride: 99 mmol/L (ref 98–111)
Creatinine: 1.21 mg/dL (ref 0.61–1.24)
GFR, Est AFR Am: >60 mL/min (ref >60)
Glucose, Bld: 119 mg/dL — ABNORMAL HIGH (ref 70–99)
Potassium: 4.3 mmol/L (ref 3.5–5.1)
Sodium: 137 mmol/L (ref 135–145)
TOTAL PROTEIN: 8 g/dL (ref 6.5–8.1)

## 2018-01-27 LAB — MAGNESIUM: MAGNESIUM: 1.9 mg/dL (ref 1.7–2.4)

## 2018-01-27 MED ORDER — SODIUM CHLORIDE 0.9% FLUSH
10.0000 mL | INTRAVENOUS | Status: DC | PRN
  Administered 2018-01-27: 10 mL
  Filled 2018-01-27: qty 10

## 2018-01-27 MED ORDER — HEPARIN SOD (PORK) LOCK FLUSH 100 UNIT/ML IV SOLN
500.0000 [IU] | Freq: Once | INTRAVENOUS | Status: AC | PRN
  Administered 2018-01-27: 500 [IU]
  Filled 2018-01-27: qty 5

## 2018-01-27 NOTE — Progress Notes (Signed)
[facility] OFFICE PROGRESS NOTE   Diagnosis: Cholangiocarcinoma  INTERVAL HISTORY:   Michael Mosley returns as scheduled.  He completed another cycle of gemcitabine/cisplatin 01/06/2018.  He denies nausea/vomiting.  No mouth sores.  No diarrhea.  No tinnitus.  No hearing loss.  No neuropathy symptoms.  He has lost about 10 pounds since his last visit.  He attributes this to an alteration in taste as well as dry mouth related to previous radiation.  He denies pain.  Objective:  Vital signs in last 24 hours:  Blood pressure 129/84, pulse 75, temperature 98.2 F (36.8 C), temperature source Oral, resp. rate 17, height _0  (1.854 m), weight 194 lb (88 kg), SpO2 100 %.    HEENT: No thrush or ulcers. Resp: Lungs clear bilaterally. Cardio: Regular rate and rhythm. GI: Abdomen soft and nontender.  No hepatomegaly. Vascular: No leg edema. Port-A-Cath without erythema.  Lab Results:  Lab Results  Component Value Date   WBC 4.9 01/27/2018   HGB 9.0 (L) 01/27/2018   HCT 28.1 (L) 01/27/2018   MCV 90.6 01/27/2018   PLT 250 01/27/2018   NEUTROABS 2.9 01/27/2018    Imaging:  No results found.  Medications: I have reviewed the patient's current medications.  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   CT abdomen/pelvis on 04/21/2017-7 cm mass lesion in the lateral segment of the left liver associated with smaller lesions in the anterior right liver and medial lobe left liver. This was associated with necrotic lymphadenopathy in the gastrohepatic and hepatoduodenal ligaments.  04/25/2017 status post upper endoscopy and colonoscopy.   On the upper endoscopy he was found to have 1 moderate benign-appearing intrinsic stenosis at 40 cm from the incisors. This was dilated. The exam of the esophagus was otherwise  normal. The entire examined stomach and duodenum also normal.   On the colonoscopy he was found to have a 5 mm polyp in the cecum which was removed. Pathology showed a serrated polyp with features of a sessile serrated polyp; negative for cytologic dysplasia. He was noted to have many small and large mouth diverticula in the sigmoid colon and descending colon. Terminal ileum appeared normal. Internal hemorrhoids were found as well.   04/28/2017 status post biopsy of a liver lesion. Pathology showed adenocarcinoma positive for cytokeratin 7 (strong), CDX-2 (weak) and focal TTF-1. Cytokeratin 20 essentially negative.  CT chest 05/12/2017-no evidence of metastatic disease or a primary chest tumor  oundation1-microsatellite stable; tumor mutational burden 6 Muts/Mb; CDKN28 loss  Cycle 1 gemcitabine/cisplatin 07/01/2017  Cycle 2 gemcitabine/cisplatin 07/15/2017  Cycle 3 gemcitabine/cisplatin 07/29/2017  Cycle 4 gemcitabine/cisplatin 08/12/2017  Cycle 5 gemcitabine/cisplatin 08/26/2017  CT 09/15/2017- decreased size of dominant and smaller hepatic lesions, improved upper abdominal lymphadenopathy  Cycle 6 gemcitabine/cisplatin 09/16/2017  Cycle 7 gemcitabine/cisplatin 09/29/2017  Cycle 8 gemcitabine/cisplatin 10/15/2017  Cycle 9 gemcitabine/cisplatin 11/05/2017  CT abdomen/pelvis 12/15/2017-dominant mass lesion in the left lateral hepatic lobe measures slightly smaller and visually appears very similar. New round lesion in the right hepatic lobe concerning for a new focus of metastatic disease. Previous described metastatic deposit in the right hepatic lobe is decreased in size. Overall decrease in size of gastrohepatic ligament lymph nodes although one lymph node does measure slightly larger.  Cycle 10 gemcitabine/cisplatin 12/16/2017  Cycle 11 gemcitabine/cisplatin 01/06/2018 (cisplatin dose reduced secondary to a mild increase in the creatinine)  Cycle 12 gemcitabine/cisplatin  01/28/2018 2. Weight  loss 3. 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 Alen Blew) 4. Asthma 5. Hypertension 6. Hyperlipidemia 7. Hypothyroid 8. 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  He has completed 11 cycles of gemcitabine/cisplatin.  Plan to proceed with cycle 12 as scheduled 01/28/2018.  We reviewed the CBC from today.  Counts are adequate to proceed with treatment as above.  We discussed the weight loss.  He declines a referral to the [facility] dietitian.  He will work on nutrition over the next 3 weeks.  He will return for lab, follow-up and the next cycle of gemcitabine/cisplatin in 3 weeks.  He will contact the office in the interim with any problems.  Plan reviewed with Dr. Benay Spice.    Ned Card ANP/GNP-BC   01/27/2018  10:36 AM

## 2018-01-28 ENCOUNTER — Inpatient Hospital Stay: Payer: PPO

## 2018-01-28 VITALS — BP 117/82 | HR 77 | Temp 98.7°F | Resp 16

## 2018-01-28 DIAGNOSIS — Z5111 Encounter for antineoplastic chemotherapy: Secondary | ICD-10-CM | POA: Diagnosis not present

## 2018-01-28 DIAGNOSIS — C221 Intrahepatic bile duct carcinoma: Secondary | ICD-10-CM

## 2018-01-28 MED ORDER — SODIUM CHLORIDE 0.9 % IV SOLN
Freq: Once | INTRAVENOUS | Status: AC
  Administered 2018-01-28: 12:00:00 via INTRAVENOUS
  Filled 2018-01-28: qty 5

## 2018-01-28 MED ORDER — PALONOSETRON HCL INJECTION 0.25 MG/5ML
INTRAVENOUS | Status: AC
  Filled 2018-01-28: qty 5

## 2018-01-28 MED ORDER — SODIUM CHLORIDE 0.9 % IV SOLN
800.0000 mg/m2 | Freq: Once | INTRAVENOUS | Status: AC
  Administered 2018-01-28: 1710 mg via INTRAVENOUS
  Filled 2018-01-28: qty 44.97

## 2018-01-28 MED ORDER — HEPARIN SOD (PORK) LOCK FLUSH 100 UNIT/ML IV SOLN
500.0000 [IU] | Freq: Once | INTRAVENOUS | Status: AC | PRN
  Administered 2018-01-28: 500 [IU]
  Filled 2018-01-28: qty 5

## 2018-01-28 MED ORDER — SODIUM CHLORIDE 0.9 % IV SOLN
20.0000 mg/m2 | Freq: Once | INTRAVENOUS | Status: AC
  Administered 2018-01-28: 43 mg via INTRAVENOUS
  Filled 2018-01-28: qty 43

## 2018-01-28 MED ORDER — POTASSIUM CHLORIDE 2 MEQ/ML IV SOLN
Freq: Once | INTRAVENOUS | Status: AC
  Administered 2018-01-28: 10:00:00 via INTRAVENOUS
  Filled 2018-01-28: qty 10

## 2018-01-28 MED ORDER — SODIUM CHLORIDE 0.9% FLUSH
10.0000 mL | INTRAVENOUS | Status: DC | PRN
  Administered 2018-01-28: 10 mL
  Filled 2018-01-28: qty 10

## 2018-01-28 MED ORDER — SODIUM CHLORIDE 0.9 % IV SOLN
Freq: Once | INTRAVENOUS | Status: AC
  Administered 2018-01-28: 10:00:00 via INTRAVENOUS
  Filled 2018-01-28: qty 250

## 2018-01-28 MED ORDER — PALONOSETRON HCL INJECTION 0.25 MG/5ML
0.2500 mg | Freq: Once | INTRAVENOUS | Status: AC
  Administered 2018-01-28: 0.25 mg via INTRAVENOUS

## 2018-01-28 NOTE — Addendum Note (Signed)
Addended by: Betsy Coder B on: 01/28/2018 09:32 AM   Modules accepted: Orders

## 2018-01-28 NOTE — Patient Instructions (Signed)
[facility] Discharge Instructions for Patients Receiving Chemotherapy  Today you received the following chemotherapy agents:  Gemzar, Cisplatin  To help prevent nausea and vomiting after your treatment, we encourage you to take your nausea medication as prescribed.   If you develop nausea and vomiting that is not controlled by your nausea medication, call the clinic.   BELOW ARE SYMPTOMS THAT SHOULD BE REPORTED IMMEDIATELY:  *FEVER GREATER THAN 100.5 F  *CHILLS WITH OR WITHOUT FEVER  NAUSEA AND VOMITING THAT IS NOT CONTROLLED WITH YOUR NAUSEA MEDICATION  *UNUSUAL SHORTNESS OF BREATH  *UNUSUAL BRUISING OR BLEEDING  TENDERNESS IN MOUTH AND THROAT WITH OR WITHOUT PRESENCE OF ULCERS  *URINARY PROBLEMS  *BOWEL PROBLEMS  UNUSUAL RASH Items with * indicate a potential emergency and should be followed up as soon as possible.  Feel free to call the clinic should you have any questions or concerns. The clinic phone number is [phone].  Please show the CHEMO ALERT CARD at check-in to the Emergency Department and triage nurse.   

## 2018-01-29 ENCOUNTER — Telehealth: Payer: Self-pay | Admitting: Oncology

## 2018-01-29 NOTE — Telephone Encounter (Signed)
Scheduled appt per 11/19 los - patient to get an updated schedule next visit.   

## 2018-02-02 ENCOUNTER — Other Ambulatory Visit: Payer: Self-pay

## 2018-02-02 ENCOUNTER — Ambulatory Visit (INDEPENDENT_AMBULATORY_CARE_PROVIDER_SITE_OTHER): Payer: PPO | Admitting: Family Medicine

## 2018-02-02 ENCOUNTER — Encounter: Payer: Self-pay | Admitting: Family Medicine

## 2018-02-02 ENCOUNTER — Other Ambulatory Visit: Payer: Self-pay | Admitting: *Deleted

## 2018-02-02 ENCOUNTER — Ambulatory Visit (INDEPENDENT_AMBULATORY_CARE_PROVIDER_SITE_OTHER): Payer: PPO

## 2018-02-02 ENCOUNTER — Telehealth: Payer: Self-pay | Admitting: *Deleted

## 2018-02-02 VITALS — BP 130/71 | HR 86 | Temp 98.1°F | Resp 18 | Ht 73.0 in | Wt 189.6 lb

## 2018-02-02 DIAGNOSIS — R509 Fever, unspecified: Secondary | ICD-10-CM

## 2018-02-02 DIAGNOSIS — C220 Liver cell carcinoma: Secondary | ICD-10-CM | POA: Diagnosis not present

## 2018-02-02 DIAGNOSIS — R61 Generalized hyperhidrosis: Secondary | ICD-10-CM

## 2018-02-02 DIAGNOSIS — C01 Malignant neoplasm of base of tongue: Secondary | ICD-10-CM | POA: Diagnosis not present

## 2018-02-02 DIAGNOSIS — C221 Intrahepatic bile duct carcinoma: Secondary | ICD-10-CM

## 2018-02-02 LAB — POCT CBC
Granulocyte percent: 73.1 %G (ref 37–80)
HCT, POC: 28.4 % — AB (ref 29–41)
Hemoglobin: 9.5 g/dL (ref 9.5–13.5)
Lymph, poc: 1.1 (ref 0.6–3.4)
MCH: 29 pg (ref 27–31.2)
MCHC: 33.5 g/dL (ref 31.8–35.4)
MCV: 86.6 fL (ref 76–111)
MID (CBC): 0.4 (ref 0–0.9)
MPV: 6.5 fL (ref 0–99.8)
POC Granulocyte: 4.1 (ref 2–6.9)
POC LYMPH %: 19.1 % (ref 10–50)
POC MID %: 7.8 % (ref 0–12)
Platelet Count, POC: 271 10*3/uL (ref 142–424)
RBC: 3.27 M/uL — AB (ref 4.69–6.13)
RDW, POC: 15.7 %
WBC: 5.6 10*3/uL (ref 4.6–10.2)

## 2018-02-02 MED ORDER — AMOXICILLIN-POT CLAVULANATE 875-125 MG PO TABS: 1.0000 | ORAL_TABLET | Freq: Two times a day (BID) | ORAL | 0 refills | Status: DC

## 2018-02-02 NOTE — Progress Notes (Signed)
Patient ID: Michael Mosley, male    DOB: [birth year]  Age: 67 y.o. MRN: [id number]  Chief Complaint  Patient presents with  . Night Sweats    waking up in middle of the night with fever and sweating     Subjective:   67 year old man who is here with a history of night sweats since last week.  He has taken his temperature frequently at home and had a number of readings in the 99-100 range, but he also has had up to 101.3.  He feels really washed out with no energy.  No other specific symptoms to parallel infection.  He does not have any ear pain no sore throat no coughing.  He has no abdominal pain, nausea or vomiting.  He has a poor appetite but this all goes along with his cancer and his chemotherapy.  No dysuria.  He does have not terminal urinary frequency but he has had that.  He does not have any burning when he urinates.  He has been seeing numerous doctors for his cancer and his heart and vascular problems.  Current allergies, medications, problem list, past/family and social histories reviewed.  Objective:  BP 130/71   Pulse 86   Temp 98.1 F (36.7 C) (Oral)   Resp 18   Ht 6\' 1"  (1.854 m)   Wt 189 lb 9.6 oz (86 kg)   SpO2 98%   BMI 25.01 kg/m   No major acute distress right now but he just does not feel well.  His TMs are normal.  Throat clear.  Neck supple.  His scar tissue in the right side of his neck and possible lymphadenopathy there.  Chest is clear to auscultation.  Heart has a grade 3/6 systolic murmur in the aortic area down to the apex.  Is abdomen is soft without masses or tenderness.  No CVA tenderness.  Assessment & Plan:   Assessment: 1. Night sweats   2. Fever, unspecified fever cause       Plan: Check a blood count on him before deciding on treating.  He has not had a flu shot this year.  His relatives have given him a lot of anti-injection information.  Orders Placed This Encounter  Procedures  . DG Chest 2 View    Standing Status:   Future   Number of Occurrences:   1    Standing Expiration Date:   04/05/2019    Order Specific Question:   Reason for Exam (SYMPTOM  OR DIAGNOSIS REQUIRED)    Answer:   night sweats    Order Specific Question:   Preferred imaging location?    Answer:   External    Order Specific Question:   Radiology Contrast Protocol - do NOT remove file path    Answer:   [email or web address]  . POCT CBC    Meds ordered this encounter  Medications  . amoxicillin-clavulanate (AUGMENTIN) 875-125 MG tablet    Sig: Take 1 tablet by mouth 2 (two) times daily.    Dispense:  20 tablet    Refill:  0    Results for orders placed or performed in visit on 02/02/18  POCT CBC  Result Value Ref Range   WBC 5.6 4.6 - 10.2 K/uL   Lymph, poc 1.1 0.6 - 3.4   POC LYMPH PERCENT 19.1 10 - 50 %L   MID (cbc) 0.4 0 - 0.9   POC MID % 7.8 0 - 12 %M   POC Granulocyte 4.1 2 - 6.9  Granulocyte percent 73.1 37 - 80 %G   RBC 3.27 (A) 4.69 - 6.13 M/uL   Hemoglobin 9.5 9.5 - 13.5 g/dL   HCT, POC 28.4 (A) 29 - 41 %   MCV 86.6 76 - 111 fL   MCH, POC 29.0 27 - 31.2 pg   MCHC 33.5 31.8 - 35.4 g/dL   RDW, POC 15.7 %   Platelet Count, POC 271 142 - 424 K/uL   MPV 6.5 0 - 99.8 fL    Sweats and fever are are from undetermined etiology.  I think I will cover him with an antibiotic despite no good indication.  He is to get rechecked or return if anything gets worse.    Patient Instructions    It is uncertain where your fever is coming from.  It could be related to your cancer, or to some other hidden infection.  I am treating you with Augmentin (amoxicillin/clavulanate) 875 mg 1 twice daily as antibiotic.  If any other symptoms are changing you need to get rechecked or see your cancer specialist or go to the emergency room if necessary.   If you have lab work done today you will be contacted with your lab results within the next 2 weeks.  If you have not heard from [place] then please contact us.  The fastest way to get your results is to register for My Chart.   IF you received an x-ray today, you will receive an invoice from [facility] Rad[facility]. Please contact [facility] Rad[facility] at [phone] with questions or concerns regarding your invoice.   IF you received labwork today, you will receive an invoice from [facility]. Please contact [facility] at [phone] with questions or concerns regarding your invoice.   Our billing staff will not be able to assist you with questions regarding bills from these companies.  You will be contacted with the lab results as soon as they are available. The fastest way to get your results is to activate your My Chart account. Instructions are located on the last page of this paperwork. If you have not heard from [place] regarding the results in 2 weeks, please contact this office.        Return if symptoms worsen or fail to improve.   Ruben Reason, MD 02/02/2018

## 2018-02-02 NOTE — Patient Instructions (Addendum)
  It is uncertain where your fever is coming from.  It could be related to your cancer, or to some other hidden infection.  I am treating you with Augmentin (amoxicillin/clavulanate) 875 mg 1 twice daily as antibiotic.  If any other symptoms are changing you need to get rechecked or see your cancer specialist or go to the emergency room if necessary.   If you have lab work done today you will be contacted with your lab results within the next 2 weeks.  If you have not heard from [place] then please contact us. The fastest way to get your results is to register for My Chart.   IF you received an x-ray today, you will receive an invoice from [facility]. Please contact [facility] Radiology at [phone] with questions or concerns regarding your invoice.   IF you received labwork today, you will receive an invoice from [facility]. Please contact [facility] at [phone] with questions or concerns regarding your invoice.   Our billing staff will not be able to assist you with questions regarding bills from these companies.  You will be contacted with the lab results as soon as they are available. The fastest way to get your results is to activate your My Chart account. Instructions are located on the last page of this paperwork. If you have not heard from [place] regarding the results in 2 weeks, please contact this office.

## 2018-02-02 NOTE — Telephone Encounter (Signed)
Patient called after hours service on 01/30/18 with reports of night sweats causing clothes and sheets to be soaking with fevers from 99.8 to 101.5 over last 5 days. Wants an antibiotic called in. Called patient and offered to be seen by [facility] or Dr. Benay Spice and he declines due to high copay for specialist. Informed him he may have infection, could be related to tumor or bile duct could be getting blocked. He prefers to see PCP office with $10 copay at 3 pm today. Encouraged him to have Dr. Linna Darner call Dr. Benay Spice today if he has any questions, since Dr. Benay Spice is on call. He expressed appreciation for the call back.

## 2018-02-04 ENCOUNTER — Telehealth: Payer: Self-pay | Admitting: *Deleted

## 2018-02-04 ENCOUNTER — Telehealth: Payer: Self-pay | Admitting: Oncology

## 2018-02-04 NOTE — Telephone Encounter (Signed)
Per Dr. Benay Spice : OK to delay next treatment to week of 02/23/18. No specific known interval between treatments. His treatment goal is to keep cancer at [place] and  him comfortable as long as possible. Patient notified and he appreciates the reschedule. Also requests an appointment calendar be mailed to his home.

## 2018-02-04 NOTE — Telephone Encounter (Signed)
Called to ask if he could move his next chemo out to week of 12/16 (currently on 02/17/18)? Last chemo was 01/28/18. Feels treatment bring him down to low. Losing weight and not eating well. Saw Dr. Linna Darner on 11/25 and was put on Augmentin bid. Vomited this morning. Reports his night fever went away. He is asking what is the longest interval between treatments that would be allowed and still be effective?

## 2018-02-04 NOTE — Telephone Encounter (Signed)
Patient said that Michael Mosley had already called him

## 2018-02-09 ENCOUNTER — Telehealth: Payer: Self-pay | Admitting: Family Medicine

## 2018-02-09 NOTE — Telephone Encounter (Signed)
Left a VM in regards to needing to know where he had his diabetic eye exam done at so we can obtain those records.

## 2018-02-13 ENCOUNTER — Ambulatory Visit: Payer: PPO | Admitting: Vascular Surgery

## 2018-02-17 ENCOUNTER — Other Ambulatory Visit: Payer: PPO

## 2018-02-17 ENCOUNTER — Ambulatory Visit: Payer: PPO

## 2018-02-17 ENCOUNTER — Ambulatory Visit: Payer: PPO | Admitting: Oncology

## 2018-02-22 ENCOUNTER — Other Ambulatory Visit: Payer: Self-pay | Admitting: Oncology

## 2018-02-23 ENCOUNTER — Inpatient Hospital Stay: Payer: PPO | Attending: Oncology

## 2018-02-23 ENCOUNTER — Inpatient Hospital Stay: Payer: PPO

## 2018-02-23 ENCOUNTER — Inpatient Hospital Stay (HOSPITAL_BASED_OUTPATIENT_CLINIC_OR_DEPARTMENT_OTHER): Payer: PPO | Admitting: Oncology

## 2018-02-23 ENCOUNTER — Telehealth: Payer: Self-pay

## 2018-02-23 VITALS — BP 119/79 | HR 81 | Temp 98.7°F | Resp 18 | Ht 73.0 in | Wt 187.9 lb

## 2018-02-23 DIAGNOSIS — E039 Hypothyroidism, unspecified: Secondary | ICD-10-CM

## 2018-02-23 DIAGNOSIS — J45909 Unspecified asthma, uncomplicated: Secondary | ICD-10-CM

## 2018-02-23 DIAGNOSIS — R785 Finding of other psychotropic drug in blood: Secondary | ICD-10-CM | POA: Diagnosis not present

## 2018-02-23 DIAGNOSIS — R634 Abnormal weight loss: Secondary | ICD-10-CM | POA: Diagnosis not present

## 2018-02-23 DIAGNOSIS — Z85810 Personal history of malignant neoplasm of tongue: Secondary | ICD-10-CM

## 2018-02-23 DIAGNOSIS — Z5111 Encounter for antineoplastic chemotherapy: Secondary | ICD-10-CM

## 2018-02-23 DIAGNOSIS — I739 Peripheral vascular disease, unspecified: Secondary | ICD-10-CM | POA: Diagnosis not present

## 2018-02-23 DIAGNOSIS — E785 Hyperlipidemia, unspecified: Secondary | ICD-10-CM | POA: Diagnosis not present

## 2018-02-23 DIAGNOSIS — I10 Essential (primary) hypertension: Secondary | ICD-10-CM

## 2018-02-23 DIAGNOSIS — K635 Polyp of colon: Secondary | ICD-10-CM | POA: Diagnosis not present

## 2018-02-23 DIAGNOSIS — R630 Anorexia: Secondary | ICD-10-CM | POA: Insufficient documentation

## 2018-02-23 DIAGNOSIS — K648 Other hemorrhoids: Secondary | ICD-10-CM

## 2018-02-23 DIAGNOSIS — C221 Intrahepatic bile duct carcinoma: Secondary | ICD-10-CM

## 2018-02-23 DIAGNOSIS — R61 Generalized hyperhidrosis: Secondary | ICD-10-CM | POA: Insufficient documentation

## 2018-02-23 DIAGNOSIS — Z95828 Presence of other vascular implants and grafts: Secondary | ICD-10-CM

## 2018-02-23 LAB — CBC WITH DIFFERENTIAL (CANCER CENTER ONLY)
Abs Immature Granulocytes: 0.04 10*3/uL (ref 0.00–0.07)
Basophils Absolute: 0 10*3/uL (ref 0.0–0.1)
Basophils Relative: 1 %
EOS ABS: 0.3 10*3/uL (ref 0.0–0.5)
Eosinophils Relative: 3 %
HCT: 23.4 % — ABNORMAL LOW (ref 39.0–52.0)
Hemoglobin: 7.3 g/dL — ABNORMAL LOW (ref 13.0–17.0)
IMMATURE GRANULOCYTES: 1 %
Lymphocytes Relative: 9 %
Lymphs Abs: 0.7 10*3/uL (ref 0.7–4.0)
MCH: 27.2 pg (ref 26.0–34.0)
MCHC: 31.2 g/dL (ref 30.0–36.0)
MCV: 87.3 fL (ref 80.0–100.0)
Monocytes Absolute: 1.1 10*3/uL — ABNORMAL HIGH (ref 0.1–1.0)
Monocytes Relative: 13 %
Neutro Abs: 6.3 10*3/uL (ref 1.7–7.7)
Neutrophils Relative %: 73 %
Platelet Count: 314 10*3/uL (ref 150–400)
RBC: 2.68 MIL/uL — AB (ref 4.22–5.81)
RDW: 15.9 % — ABNORMAL HIGH (ref 11.5–15.5)
WBC: 8.5 10*3/uL (ref 4.0–10.5)
nRBC: 0 % (ref 0.0–0.2)

## 2018-02-23 LAB — CMP (CANCER CENTER ONLY)
ALT: 20 U/L (ref 0–44)
AST: 49 U/L — ABNORMAL HIGH (ref 15–41)
Albumin: 2.8 g/dL — ABNORMAL LOW (ref 3.5–5.0)
Alkaline Phosphatase: 66 U/L (ref 38–126)
Anion gap: 11 (ref 5–15)
BUN: 12 mg/dL (ref 8–23)
CALCIUM: 9.2 mg/dL (ref 8.9–10.3)
CO2: 27 mmol/L (ref 22–32)
Chloride: 95 mmol/L — ABNORMAL LOW (ref 98–111)
Creatinine: 1.06 mg/dL (ref 0.61–1.24)
GFR, Est AFR Am: >60 mL/min (ref >60)
GFR, Estimated: >60 mL/min (ref >60)
Glucose, Bld: 114 mg/dL — ABNORMAL HIGH (ref 70–99)
Potassium: 4.4 mmol/L (ref 3.5–5.1)
Sodium: 133 mmol/L — ABNORMAL LOW (ref 135–145)
Total Bilirubin: 0.5 mg/dL (ref 0.3–1.2)
Total Protein: 7.3 g/dL (ref 6.5–8.1)

## 2018-02-23 LAB — SAMPLE TO BLOOD BANK

## 2018-02-23 LAB — MAGNESIUM: MAGNESIUM: 1.8 mg/dL (ref 1.7–2.4)

## 2018-02-23 MED ORDER — SODIUM CHLORIDE 0.9% FLUSH
10.0000 mL | INTRAVENOUS | Status: DC | PRN
  Administered 2018-02-23: 10 mL via INTRAVENOUS
  Filled 2018-02-23: qty 10

## 2018-02-23 MED ORDER — SODIUM CHLORIDE 0.9% FLUSH
10.0000 mL | INTRAVENOUS | Status: DC | PRN
  Administered 2018-02-23: 10 mL
  Filled 2018-02-23: qty 10

## 2018-02-23 MED ORDER — HEPARIN SOD (PORK) LOCK FLUSH 100 UNIT/ML IV SOLN
500.0000 [IU] | Freq: Once | INTRAVENOUS | Status: AC
  Administered 2018-02-23: 500 [IU] via INTRAVENOUS
  Filled 2018-02-23: qty 5

## 2018-02-23 NOTE — Progress Notes (Signed)
[facility] OFFICE PROGRESS NOTE   Diagnosis: Cholangiocarcinoma  INTERVAL HISTORY:   Michael Mosley returns as scheduled.  He completed another cycle of gemcitabine/cisplatin 01/28/2018.  No rash, fever, neuropathy symptoms, or nausea/vomiting.  He has developed night sweats.  He has anorexia, but reports eating.  No pain.  He has decided to discontinue anticoagulation therapy.  Objective:  Vital signs in last 24 hours:  Blood pressure 119/79, pulse 81, temperature 98.7 F (37.1 C), temperature source Oral, resp. rate 18, height '6\' 1"'  (1.854 m), weight 187 lb 14.4 oz (85.2 kg), SpO2 97 %.    HEENT: No thrush or ulcers Resp: Lungs clear bilaterally Cardio: Regular rate and rhythm GI: No hepatomegaly, no mass, nontender Vascular: No leg edema  Portacath/PICC-without erythema  Lab Results:  Lab Results  Component Value Date   WBC 8.5 02/23/2018   HGB 7.3 (L) 02/23/2018   HCT 23.4 (L) 02/23/2018   MCV 87.3 02/23/2018   PLT 314 02/23/2018   NEUTROABS 6.3 02/23/2018    CMP  Lab Results  Component Value Date   NA 137 01/27/2018   K 4.3 01/27/2018   CL 99 01/27/2018   CO2 28 01/27/2018   GLUCOSE 119 (H) 01/27/2018   BUN 12 01/27/2018   CREATININE 1.21 01/27/2018   CALCIUM 9.9 01/27/2018   PROT 8.0 01/27/2018   ALBUMIN 3.5 01/27/2018   AST 28 01/27/2018   ALT 11 01/27/2018   ALKPHOS 75 01/27/2018   BILITOT 0.5 01/27/2018   GFRNONAA >60 01/27/2018   GFRAA >60 01/27/2018     Medications: I have reviewed the patient's current medications.   Assessment/Plan:  1. 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   CT abdomen/pelvis on 04/21/2017-7 cm mass lesion in the lateral segment of the left liver associated with smaller lesions in the anterior right liver and medial lobe left liver. This was associated with  necrotic lymphadenopathy in the gastrohepatic and hepatoduodenal ligaments.  04/25/2017 status post upper endoscopy and colonoscopy.   On the upper endoscopy he was found to have 1 moderate benign-appearing intrinsic stenosis at 40 cm from the incisors. This was dilated. The exam of the esophagus was otherwise normal. The entire examined stomach and duodenum also normal.   On the colonoscopy he was found to have a 5 mm polyp in the cecum which was removed. Pathology showed a serrated polyp with features of a sessile serrated polyp; negative for cytologic dysplasia. He was noted to have many small and large mouth diverticula in the sigmoid colon and descending colon. Terminal ileum appeared normal. Internal hemorrhoids were found as well.   04/28/2017 status post biopsy of a liver lesion. Pathology showed adenocarcinoma positive for cytokeratin 7 (strong), CDX-2 (weak) and focal TTF-1. Cytokeratin 20 essentially negative.  CT chest 05/12/2017-no evidence of metastatic disease or a primary chest tumor  oundation1-microsatellite stable; tumor mutational burden 6 Muts/Mb; CDKN28 loss  Cycle 1 gemcitabine/cisplatin 07/01/2017  Cycle 2 gemcitabine/cisplatin 07/15/2017  Cycle 3 gemcitabine/cisplatin 07/29/2017  Cycle 4 gemcitabine/cisplatin 08/12/2017  Cycle 5 gemcitabine/cisplatin 08/26/2017  CT 09/15/2017- decreased size of dominant and smaller hepatic lesions, improved upper abdominal lymphadenopathy  Cycle 6 gemcitabine/cisplatin 09/16/2017  Cycle 7 gemcitabine/cisplatin 09/29/2017  Cycle 8 gemcitabine/cisplatin 10/15/2017  Cycle 9 gemcitabine/cisplatin 11/05/2017  CT abdomen/pelvis 12/15/2017-dominant mass lesion in the left lateral hepatic lobe measures slightly smaller and visually appears very similar. New round lesion in the right hepatic lobe concerning  for a new focus of metastatic disease. Previous described metastatic deposit in the right hepatic lobe is decreased in size.  Overall decrease in size of gastrohepatic ligament lymph nodes although one lymph node does measure slightly larger.  Cycle 10 gemcitabine/cisplatin 12/16/2017  Cycle 11 gemcitabine/cisplatin 01/06/2018 (cisplatin dose reduced secondary to a mild increase in the creatinine)  Cycle 12 gemcitabine/cisplatin 01/28/2018 2. Weight loss 3. 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 Alen Blew) 4. Asthma 5. Hypertension 6. Hyperlipidemia 7. Hypothyroid 8. 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  I am concerned he is developing progressive disease based on the malaise and weight loss.  He has progressive anemia.  This may be related to chemotherapy, chronic disease, or bleeding. He will return tomorrow for the next cycle of chemotherapy.  He will not agree to a restaging CT evaluation at present.  We will follow-up on the CA 19-9 from today.  We will offer a red cell transfusion and recommend he return for a repeat CBC in 2 weeks.  25 minutes were spent with the patient today.  The majority of the time was used for counseling and coordination of care.

## 2018-02-23 NOTE — Telephone Encounter (Signed)
Printed avs and calender of upcoming appointment. Per 12/16 los. Melanie adding 1 unit of blood to treatment tomorrow .

## 2018-02-24 ENCOUNTER — Other Ambulatory Visit: Payer: Self-pay | Admitting: *Deleted

## 2018-02-24 ENCOUNTER — Inpatient Hospital Stay: Payer: PPO

## 2018-02-24 VITALS — BP 122/79 | HR 68 | Temp 98.1°F | Resp 18

## 2018-02-24 DIAGNOSIS — C221 Intrahepatic bile duct carcinoma: Secondary | ICD-10-CM

## 2018-02-24 DIAGNOSIS — Z5111 Encounter for antineoplastic chemotherapy: Secondary | ICD-10-CM | POA: Diagnosis not present

## 2018-02-24 DIAGNOSIS — D649 Anemia, unspecified: Secondary | ICD-10-CM

## 2018-02-24 LAB — CANCER ANTIGEN 19-9: CA 19-9: 60 U/mL — ABNORMAL HIGH (ref 0–35)

## 2018-02-24 LAB — ABO/RH: ABO/RH(D): O POS

## 2018-02-24 LAB — PREPARE RBC (CROSSMATCH)

## 2018-02-24 MED ORDER — PALONOSETRON HCL INJECTION 0.25 MG/5ML
INTRAVENOUS | Status: AC
  Filled 2018-02-24: qty 5

## 2018-02-24 MED ORDER — SODIUM CHLORIDE 0.9% IV SOLUTION
250.0000 mL | Freq: Once | INTRAVENOUS | Status: AC
  Administered 2018-02-24: 250 mL via INTRAVENOUS
  Filled 2018-02-24: qty 250

## 2018-02-24 MED ORDER — HEPARIN SOD (PORK) LOCK FLUSH 100 UNIT/ML IV SOLN
500.0000 [IU] | Freq: Once | INTRAVENOUS | Status: AC | PRN
  Administered 2018-02-24: 500 [IU]
  Filled 2018-02-24: qty 5

## 2018-02-24 MED ORDER — SODIUM CHLORIDE 0.9 % IV SOLN
Freq: Once | INTRAVENOUS | Status: AC
  Administered 2018-02-24: 08:00:00 via INTRAVENOUS
  Filled 2018-02-24: qty 250

## 2018-02-24 MED ORDER — SODIUM CHLORIDE 0.9 % IV SOLN
20.0000 mg/m2 | Freq: Once | INTRAVENOUS | Status: AC
  Administered 2018-02-24: 43 mg via INTRAVENOUS
  Filled 2018-02-24: qty 43

## 2018-02-24 MED ORDER — POTASSIUM CHLORIDE 2 MEQ/ML IV SOLN
Freq: Once | INTRAVENOUS | Status: AC
  Administered 2018-02-24: 08:00:00 via INTRAVENOUS
  Filled 2018-02-24: qty 10

## 2018-02-24 MED ORDER — SODIUM CHLORIDE 0.9 % IV SOLN
800.0000 mg/m2 | Freq: Once | INTRAVENOUS | Status: AC
  Administered 2018-02-24: 1710 mg via INTRAVENOUS
  Filled 2018-02-24: qty 44.97

## 2018-02-24 MED ORDER — SODIUM CHLORIDE 0.9 % IV SOLN
Freq: Once | INTRAVENOUS | Status: AC
  Administered 2018-02-24: 10:00:00 via INTRAVENOUS
  Filled 2018-02-24: qty 5

## 2018-02-24 MED ORDER — SODIUM CHLORIDE 0.9% FLUSH
10.0000 mL | INTRAVENOUS | Status: DC | PRN
  Administered 2018-02-24: 10 mL
  Filled 2018-02-24: qty 10

## 2018-02-24 MED ORDER — PALONOSETRON HCL INJECTION 0.25 MG/5ML
0.2500 mg | Freq: Once | INTRAVENOUS | Status: AC
  Administered 2018-02-24: 0.25 mg via INTRAVENOUS

## 2018-02-24 NOTE — Patient Instructions (Signed)
[facility] Discharge Instructions for Patients Receiving Chemotherapy  Today you received the following chemotherapy agents: Cisplatin (Platinol) and Gemcitabine (Gemzar)  To help prevent nausea and vomiting after your treatment, we encourage you to take your nausea medication as directed.    If you develop nausea and vomiting that is not controlled by your nausea medication, call the clinic.   BELOW ARE SYMPTOMS THAT SHOULD BE REPORTED IMMEDIATELY:  *FEVER GREATER THAN 100.5 F  *CHILLS WITH OR WITHOUT FEVER  NAUSEA AND VOMITING THAT IS NOT CONTROLLED WITH YOUR NAUSEA MEDICATION  *UNUSUAL SHORTNESS OF BREATH  *UNUSUAL BRUISING OR BLEEDING  TENDERNESS IN MOUTH AND THROAT WITH OR WITHOUT PRESENCE OF ULCERS  *URINARY PROBLEMS  *BOWEL PROBLEMS  UNUSUAL RASH Items with * indicate a potential emergency and should be followed up as soon as possible.  Feel free to call the clinic should you have any questions or concerns. The clinic phone number is [phone].  Please show the [facility] at check-in to the Emergency Department and triage nurse.  Blood Transfusion, Care After This sheet gives you information about how to care for yourself after your procedure. Your doctor may also give you more specific instructions. If you have problems or questions, contact your doctor. Follow these instructions at home:  Take over-the-counter and prescription medicines only as told by your doctor.  Go back to your normal activities as told by your doctor.  Follow instructions from your doctor about how to take care of the area where an IV tube was put into your vein (insertion site). Make sure you: ? Wash your hands with soap and water before you change your bandage (dressing). If there is no soap and water, use hand sanitizer. ? Change your bandage as told by your doctor.  Check your IV insertion site every day for signs of infection. Check for: ? More redness,  swelling, or pain. ? More fluid or blood. ? Warmth. ? Pus or a bad smell. Contact a doctor if:  You have more redness, swelling, or pain around the IV insertion site..  You have more fluid or blood coming from the IV insertion site.  Your IV insertion site feels warm to the touch.  You have pus or a bad smell coming from the IV insertion site.  Your pee (urine) turns pink, red, or brown.  You feel weak after doing your normal activities. Get help right away if:  You have signs of a serious allergic or body defense (immune) system reaction, including: ? Itchiness. ? Hives. ? Trouble breathing. ? Anxiety. ? Pain in your chest or lower back. ? Fever, flushing, and chills. ? Fast pulse. ? Rash. ? Watery poop (diarrhea). ? Throwing up (vomiting). ? Dark pee. ? Serious headache. ? Dizziness. ? Stiff neck. ? Yellow color in your face or the white parts of your eyes (jaundice). Summary  After a blood transfusion, return to your normal activities as told by your doctor.  Every day, check for signs of infection where the IV tube was put into your vein.  Some signs of infection are warm skin, more redness and pain, more fluid or blood, and pus or a bad smell where the needle went in.  Contact your doctor if you feel weak or have any unusual symptoms. This information is not intended to replace advice given to you by your health care provider. Make sure you discuss any questions you have with your health care provider. Document Released: 03/18/2014 Document  Revised: 10/20/2015 Document Reviewed: 10/20/2015 Elsevier Interactive Patient Education  2017 [employer].

## 2018-02-25 ENCOUNTER — Other Ambulatory Visit: Payer: Self-pay

## 2018-02-25 ENCOUNTER — Inpatient Hospital Stay: Payer: PPO

## 2018-02-25 DIAGNOSIS — D649 Anemia, unspecified: Secondary | ICD-10-CM

## 2018-02-25 DIAGNOSIS — Z5111 Encounter for antineoplastic chemotherapy: Secondary | ICD-10-CM | POA: Diagnosis not present

## 2018-02-25 LAB — PREPARE RBC (CROSSMATCH)

## 2018-02-25 MED ORDER — HEPARIN SOD (PORK) LOCK FLUSH 100 UNIT/ML IV SOLN
500.0000 [IU] | Freq: Every day | INTRAVENOUS | Status: AC | PRN
  Administered 2018-02-25: 500 [IU]
  Filled 2018-02-25: qty 5

## 2018-02-25 MED ORDER — SODIUM CHLORIDE 0.9% FLUSH
10.0000 mL | INTRAVENOUS | Status: AC | PRN
  Administered 2018-02-25: 10 mL
  Filled 2018-02-25: qty 10

## 2018-02-25 MED ORDER — SODIUM CHLORIDE 0.9% IV SOLUTION
250.0000 mL | Freq: Once | INTRAVENOUS | Status: AC
  Administered 2018-02-25: 250 mL via INTRAVENOUS
  Filled 2018-02-25: qty 250

## 2018-02-26 ENCOUNTER — Encounter: Payer: Self-pay | Admitting: Oncology

## 2018-02-26 LAB — TYPE AND SCREEN
ABO/RH(D): O POS
ANTIBODY SCREEN: NEGATIVE
UNIT DIVISION: 0
Unit division: 0

## 2018-02-26 LAB — BPAM RBC
BLOOD PRODUCT EXPIRATION DATE: 202001082359
BLOOD PRODUCT EXPIRATION DATE: 202001102359
ISSUE DATE / TIME: 201912171459
ISSUE DATE / TIME: 201912180757
UNIT TYPE AND RH: 5100
Unit Type and Rh: 5100

## 2018-02-26 NOTE — Progress Notes (Signed)
Pt called inquiring about financial assistance.  After reviewing his account I informed him that unfortunately there aren't any foundations offering copay assistance for his Dx and the type of ins he has.  He was very upset to hear this and said there has to be something available because 14 years ago he received a 85% discount.  I informed him the hospital no longer gives discounts for insured pts but he can be set up on a payment plan.  He didn't want to hear this so I encouraged him to call customer service to review his options and gave him the number.  I was able to offer the [place] to assist with personal bills, oral medication and gas cards and gave him the income requirement.  He would like to apply so he will bring his proof of income on 03/09/18.  If approved I will give him an expense sheet.

## 2018-02-27 ENCOUNTER — Telehealth: Payer: Self-pay | Admitting: *Deleted

## 2018-02-27 ENCOUNTER — Other Ambulatory Visit: Payer: Self-pay | Admitting: *Deleted

## 2018-02-27 ENCOUNTER — Other Ambulatory Visit: Payer: Self-pay | Admitting: Family Medicine

## 2018-02-27 DIAGNOSIS — E039 Hypothyroidism, unspecified: Secondary | ICD-10-CM

## 2018-02-27 MED ORDER — LEVOTHYROXINE SODIUM 75 MCG PO TABS: 75.0000 ug | ORAL_TABLET | Freq: Every day | ORAL | 0 refills | Status: AC

## 2018-02-27 NOTE — Telephone Encounter (Signed)
Patient left angry voice mail that he was at pharmacy and we had not called in his levothyroxine as discussed at last visit. Stated repeatedly that he was waiting and needs the medication now. Refilled X 1 and called pharmacy to confirm it was received. Called patient back and apologized, but this RN does not recall this conversation. Dr. Sondra Come had started the medication and Dr. Carlota Raspberry had done his last refill. Dr. Benay Spice refilled x 1, but his PCP could follow in the future.

## 2018-03-08 ENCOUNTER — Other Ambulatory Visit: Payer: Self-pay | Admitting: Oncology

## 2018-03-09 ENCOUNTER — Other Ambulatory Visit: Payer: Self-pay | Admitting: *Deleted

## 2018-03-09 ENCOUNTER — Inpatient Hospital Stay: Payer: PPO

## 2018-03-09 ENCOUNTER — Encounter: Payer: Self-pay | Admitting: Oncology

## 2018-03-09 DIAGNOSIS — Z5111 Encounter for antineoplastic chemotherapy: Secondary | ICD-10-CM | POA: Diagnosis not present

## 2018-03-09 DIAGNOSIS — C221 Intrahepatic bile duct carcinoma: Secondary | ICD-10-CM

## 2018-03-09 LAB — CBC WITH DIFFERENTIAL (CANCER CENTER ONLY)
Abs Immature Granulocytes: 0.05 10*3/uL (ref 0.00–0.07)
Basophils Absolute: 0 10*3/uL (ref 0.0–0.1)
Basophils Relative: 0 %
Eosinophils Absolute: 0.1 10*3/uL (ref 0.0–0.5)
Eosinophils Relative: 1 %
HCT: 26.6 % — ABNORMAL LOW (ref 39.0–52.0)
Hemoglobin: 8.3 g/dL — ABNORMAL LOW (ref 13.0–17.0)
Immature Granulocytes: 1 %
Lymphocytes Relative: 6 %
Lymphs Abs: 0.7 10*3/uL (ref 0.7–4.0)
MCH: 26.8 pg (ref 26.0–34.0)
MCHC: 31.2 g/dL (ref 30.0–36.0)
MCV: 85.8 fL (ref 80.0–100.0)
MONO ABS: 1.4 10*3/uL — AB (ref 0.1–1.0)
Monocytes Relative: 13 %
Neutro Abs: 8.3 10*3/uL — ABNORMAL HIGH (ref 1.7–7.7)
Neutrophils Relative %: 79 %
Platelet Count: 230 10*3/uL (ref 150–400)
RBC: 3.1 MIL/uL — ABNORMAL LOW (ref 4.22–5.81)
RDW: 16.3 % — ABNORMAL HIGH (ref 11.5–15.5)
WBC Count: 10.6 10*3/uL — ABNORMAL HIGH (ref 4.0–10.5)
nRBC: 0 % (ref 0.0–0.2)

## 2018-03-09 LAB — SAMPLE TO BLOOD BANK

## 2018-03-09 NOTE — Progress Notes (Signed)
Pt is approved for the $700 [facility] grant.  °

## 2018-03-10 ENCOUNTER — Inpatient Hospital Stay (HOSPITAL_BASED_OUTPATIENT_CLINIC_OR_DEPARTMENT_OTHER): Payer: PPO | Admitting: Nurse Practitioner

## 2018-03-10 ENCOUNTER — Inpatient Hospital Stay: Payer: PPO

## 2018-03-10 ENCOUNTER — Encounter: Payer: Self-pay | Admitting: Nurse Practitioner

## 2018-03-10 VITALS — BP 97/68 | HR 86 | Temp 98.4°F | Resp 18 | Ht 73.0 in | Wt 186.5 lb

## 2018-03-10 DIAGNOSIS — R61 Generalized hyperhidrosis: Secondary | ICD-10-CM | POA: Diagnosis not present

## 2018-03-10 DIAGNOSIS — Z95828 Presence of other vascular implants and grafts: Secondary | ICD-10-CM

## 2018-03-10 DIAGNOSIS — K635 Polyp of colon: Secondary | ICD-10-CM

## 2018-03-10 DIAGNOSIS — C221 Intrahepatic bile duct carcinoma: Secondary | ICD-10-CM

## 2018-03-10 DIAGNOSIS — R630 Anorexia: Secondary | ICD-10-CM | POA: Diagnosis not present

## 2018-03-10 DIAGNOSIS — Z5111 Encounter for antineoplastic chemotherapy: Secondary | ICD-10-CM

## 2018-03-10 DIAGNOSIS — K648 Other hemorrhoids: Secondary | ICD-10-CM | POA: Diagnosis not present

## 2018-03-10 DIAGNOSIS — I739 Peripheral vascular disease, unspecified: Secondary | ICD-10-CM | POA: Diagnosis not present

## 2018-03-10 DIAGNOSIS — I10 Essential (primary) hypertension: Secondary | ICD-10-CM | POA: Diagnosis not present

## 2018-03-10 DIAGNOSIS — E785 Hyperlipidemia, unspecified: Secondary | ICD-10-CM | POA: Diagnosis not present

## 2018-03-10 DIAGNOSIS — R634 Abnormal weight loss: Secondary | ICD-10-CM

## 2018-03-10 DIAGNOSIS — E039 Hypothyroidism, unspecified: Secondary | ICD-10-CM | POA: Diagnosis not present

## 2018-03-10 DIAGNOSIS — J45909 Unspecified asthma, uncomplicated: Secondary | ICD-10-CM | POA: Diagnosis not present

## 2018-03-10 DIAGNOSIS — Z85810 Personal history of malignant neoplasm of tongue: Secondary | ICD-10-CM

## 2018-03-10 LAB — CMP (CANCER CENTER ONLY)
ALT: 34 U/L (ref 0–44)
AST: 77 U/L — AB (ref 15–41)
Albumin: 2.4 g/dL — ABNORMAL LOW (ref 3.5–5.0)
Alkaline Phosphatase: 122 U/L (ref 38–126)
Anion gap: 11 (ref 5–15)
BUN: 16 mg/dL (ref 8–23)
CO2: 25 mmol/L (ref 22–32)
Calcium: 9 mg/dL (ref 8.9–10.3)
Chloride: 92 mmol/L — ABNORMAL LOW (ref 98–111)
Creatinine: 0.94 mg/dL (ref 0.61–1.24)
GFR, Est AFR Am: >60 mL/min (ref >60)
GFR, Estimated: >60 mL/min (ref >60)
Glucose, Bld: 111 mg/dL — ABNORMAL HIGH (ref 70–99)
Potassium: 4.4 mmol/L (ref 3.5–5.1)
Sodium: 128 mmol/L — ABNORMAL LOW (ref 135–145)
Total Bilirubin: 0.8 mg/dL (ref 0.3–1.2)
Total Protein: 7.1 g/dL (ref 6.5–8.1)

## 2018-03-10 MED ORDER — PALONOSETRON HCL INJECTION 0.25 MG/5ML
INTRAVENOUS | Status: AC
  Filled 2018-03-10: qty 5

## 2018-03-10 MED ORDER — SODIUM CHLORIDE 0.9 % IV SOLN
800.0000 mg/m2 | Freq: Once | INTRAVENOUS | Status: AC
  Administered 2018-03-10: 1710 mg via INTRAVENOUS
  Filled 2018-03-10: qty 44.97

## 2018-03-10 MED ORDER — SODIUM CHLORIDE 0.9% FLUSH
10.0000 mL | INTRAVENOUS | Status: DC | PRN
  Administered 2018-03-10: 10 mL
  Filled 2018-03-10: qty 10

## 2018-03-10 MED ORDER — PREDNISONE 10 MG PO TABS: 10.0000 mg | ORAL_TABLET | Freq: Every day | ORAL | 1 refills | Status: AC

## 2018-03-10 MED ORDER — PALONOSETRON HCL INJECTION 0.25 MG/5ML
0.2500 mg | Freq: Once | INTRAVENOUS | Status: AC
  Administered 2018-03-10: 0.25 mg via INTRAVENOUS

## 2018-03-10 MED ORDER — POTASSIUM CHLORIDE 2 MEQ/ML IV SOLN
Freq: Once | INTRAVENOUS | Status: AC
  Administered 2018-03-10: 11:00:00 via INTRAVENOUS
  Filled 2018-03-10: qty 10

## 2018-03-10 MED ORDER — SODIUM CHLORIDE 0.9 % IV SOLN
Freq: Once | INTRAVENOUS | Status: AC
  Administered 2018-03-10: 14:00:00 via INTRAVENOUS
  Filled 2018-03-10: qty 5

## 2018-03-10 MED ORDER — SODIUM CHLORIDE 0.9 % IV SOLN
20.0000 mg/m2 | Freq: Once | INTRAVENOUS | Status: AC
  Administered 2018-03-10: 43 mg via INTRAVENOUS
  Filled 2018-03-10: qty 43

## 2018-03-10 MED ORDER — HEPARIN SOD (PORK) LOCK FLUSH 100 UNIT/ML IV SOLN
500.0000 [IU] | Freq: Once | INTRAVENOUS | Status: AC | PRN
  Administered 2018-03-10: 500 [IU]
  Filled 2018-03-10: qty 5

## 2018-03-10 MED ORDER — SODIUM CHLORIDE 0.9 % IV SOLN
Freq: Once | INTRAVENOUS | Status: AC
  Administered 2018-03-10: 13:00:00 via INTRAVENOUS
  Filled 2018-03-10: qty 250

## 2018-03-10 NOTE — Progress Notes (Addendum)
[facility] OFFICE PROGRESS NOTE   Diagnosis: Cholangiocarcinoma  INTERVAL HISTORY:   Michael Mosley returns as scheduled.  He completed another cycle of gemcitabine/cisplatin 02/24/2018.  He denies nausea/vomiting.  No mouth sores.  No diarrhea.  Energy level remains poor.  He continues to have sweats and fever, typically low-grade.  He felt better for a few days after the blood transfusion.  Objective:  Vital signs in last 24 hours:  Blood pressure 97/68, pulse 86, temperature 98.4 F (36.9 C), temperature source Oral, resp. rate 18, height '6\' 1"'  (1.854 m), weight 186 lb 8 oz (84.6 kg), SpO2 97 %.    HEENT: Mouth is dry.  No thrush or ulcers. Lymphatics: No palpable cervical or supraclavicular lymph nodes. Resp: Lungs clear bilaterally. Cardio: Regular rate and rhythm. GI: Abdomen soft and nontender.  No hepatomegaly. Vascular: No leg edema.  Port-A-Cath without erythema.  Lab Results:  Lab Results  Component Value Date   WBC 10.6 (H) 03/09/2018   HGB 8.3 (L) 03/09/2018   HCT 26.6 (L) 03/09/2018   MCV 85.8 03/09/2018   PLT 230 03/09/2018   NEUTROABS 8.3 (H) 03/09/2018    Imaging:  No results found.  Medications: I have reviewed the patient's current medications.  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   CT abdomen/pelvis on 04/21/2017-7 cm mass lesion in the lateral segment of the left liver associated with smaller lesions in the anterior right liver and medial lobe left liver. This was associated with necrotic lymphadenopathy in the gastrohepatic and hepatoduodenal ligaments.  04/25/2017 status post upper endoscopy and colonoscopy.   On the upper endoscopy he was found to have 1 moderate benign-appearing intrinsic stenosis at 40 cm from the incisors. This was dilated. The exam of the  esophagus was otherwise normal. The entire examined stomach and duodenum also normal.   On the colonoscopy he was found to have a 5 mm polyp in the cecum which was removed. Pathology showed a serrated polyp with features of a sessile serrated polyp; negative for cytologic dysplasia. He was noted to have many small and large mouth diverticula in the sigmoid colon and descending colon. Terminal ileum appeared normal. Internal hemorrhoids were found as well.   04/28/2017 status post biopsy of a liver lesion. Pathology showed adenocarcinoma positive for cytokeratin 7 (strong), CDX-2 (weak) and focal TTF-1. Cytokeratin 20 essentially negative.  CT chest 05/12/2017-no evidence of metastatic disease or a primary chest tumor  oundation1-microsatellite stable; tumor mutational burden 6 Muts/Mb; CDKN28 loss  Cycle 1 gemcitabine/cisplatin 07/01/2017  Cycle 2 gemcitabine/cisplatin 07/15/2017  Cycle 3 gemcitabine/cisplatin 07/29/2017  Cycle 4 gemcitabine/cisplatin 08/12/2017  Cycle 5 gemcitabine/cisplatin 08/26/2017  CT 09/15/2017- decreased size of dominant and smaller hepatic lesions, improved upper abdominal lymphadenopathy  Cycle 6 gemcitabine/cisplatin 09/16/2017  Cycle 7 gemcitabine/cisplatin 09/29/2017  Cycle 8 gemcitabine/cisplatin 10/15/2017  Cycle 9 gemcitabine/cisplatin 11/05/2017  CT abdomen/pelvis 12/15/2017-dominant mass lesion in the left lateral hepatic lobe measures slightly smaller and visually appears very similar. New round lesion in the right hepatic lobe concerning for a new focus of metastatic disease. Previous described metastatic deposit in the right hepatic lobe is decreased in size. Overall decrease in size of gastrohepatic ligament lymph nodes although one lymph node does measure slightly larger.  Cycle 10 gemcitabine/cisplatin 12/16/2017  Cycle 11 gemcitabine/cisplatin 01/06/2018 (cisplatin dose reduced secondary to a mild increase in the creatinine)  Cycle 12  gemcitabine/cisplatin 01/28/2018  Cycle  13 gemcitabine/cisplatin 02/24/2018 2. Weight loss 3. 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 Alen Blew) 4. Asthma 5. Hypertension 6. Hyperlipidemia 7. Hypothyroid 8. 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  Anemia   Disposition: Michael Mosley completed cycle 13 gemcitabine/cisplatin on 02/24/2018.  We decided to proceed proceed with cycle 14 today as scheduled.  He agrees to undergo restaging CTs prior to his next appointment.  He will begin prednisone 10 mg daily.  He will return for follow-up as scheduled 03/23/2017.  He will contact the office in the interim with any problems.  Patient seen with Dr. Benay Spice.    Ned Card ANP/GNP-BC   03/10/2018  9:28 AM This was a shared visit with Ned Card.  Michael Mosley has night sweats and a declining performance status.  He would like to proceed with another cycle of gemcitabine/cisplatin today.  He will undergo a restaging CT evaluation after this cycle.  He will begin a trial of prednisone for appetite and tumor fever.  Julieanne Manson, MD

## 2018-03-10 NOTE — Patient Instructions (Signed)
[facility] Discharge Instructions for Patients Receiving Chemotherapy  Today you received the following chemotherapy agents: Cisplatin (Platinol) and Gemcitabine (Gemzar)  To help prevent nausea and vomiting after your treatment, we encourage you to take your nausea medication as directed.    If you develop nausea and vomiting that is not controlled by your nausea medication, call the clinic.   BELOW ARE SYMPTOMS THAT SHOULD BE REPORTED IMMEDIATELY:  *FEVER GREATER THAN 100.5 F  *CHILLS WITH OR WITHOUT FEVER  NAUSEA AND VOMITING THAT IS NOT CONTROLLED WITH YOUR NAUSEA MEDICATION  *UNUSUAL SHORTNESS OF BREATH  *UNUSUAL BRUISING OR BLEEDING  TENDERNESS IN MOUTH AND THROAT WITH OR WITHOUT PRESENCE OF ULCERS  *URINARY PROBLEMS  *BOWEL PROBLEMS  UNUSUAL RASH Items with * indicate a potential emergency and should be followed up as soon as possible.  Feel free to call the clinic should you have any questions or concerns. The clinic phone number is [phone].  Please show the [facility] at check-in to the Emergency Department and triage nurse.  Blood Transfusion, Care After This sheet gives you information about how to care for yourself after your procedure. Your doctor may also give you more specific instructions. If you have problems or questions, contact your doctor. Follow these instructions at home:  Take over-the-counter and prescription medicines only as told by your doctor.  Go back to your normal activities as told by your doctor.  Follow instructions from your doctor about how to take care of the area where an IV tube was put into your vein (insertion site). Make sure you: ? Wash your hands with soap and water before you change your bandage (dressing). If there is no soap and water, use hand sanitizer. ? Change your bandage as told by your doctor.  Check your IV insertion site every day for signs of infection. Check for: ? More redness,  swelling, or pain. ? More fluid or blood. ? Warmth. ? Pus or a bad smell. Contact a doctor if:  You have more redness, swelling, or pain around the IV insertion site..  You have more fluid or blood coming from the IV insertion site.  Your IV insertion site feels warm to the touch.  You have pus or a bad smell coming from the IV insertion site.  Your pee (urine) turns pink, red, or brown.  You feel weak after doing your normal activities. Get help right away if:  You have signs of a serious allergic or body defense (immune) system reaction, including: ? Itchiness. ? Hives. ? Trouble breathing. ? Anxiety. ? Pain in your chest or lower back. ? Fever, flushing, and chills. ? Fast pulse. ? Rash. ? Watery poop (diarrhea). ? Throwing up (vomiting). ? Dark pee. ? Serious headache. ? Dizziness. ? Stiff neck. ? Yellow color in your face or the white parts of your eyes (jaundice). Summary  After a blood transfusion, return to your normal activities as told by your doctor.  Every day, check for signs of infection where the IV tube was put into your vein.  Some signs of infection are warm skin, more redness and pain, more fluid or blood, and pus or a bad smell where the needle went in.  Contact your doctor if you feel weak or have any unusual symptoms. This information is not intended to replace advice given to you by your health care provider. Make sure you discuss any questions you have with your health care provider. Document Released: 03/18/2014 Document  Revised: 10/20/2015 Document Reviewed: 10/20/2015 Elsevier Interactive Patient Education  2017 [employer].

## 2018-03-20 ENCOUNTER — Encounter: Payer: Self-pay | Admitting: Vascular Surgery

## 2018-03-20 ENCOUNTER — Ambulatory Visit (HOSPITAL_COMMUNITY): Payer: PPO

## 2018-03-20 ENCOUNTER — Ambulatory Visit: Payer: PPO | Admitting: Vascular Surgery

## 2018-03-21 ENCOUNTER — Other Ambulatory Visit: Payer: Self-pay | Admitting: Oncology

## 2018-03-23 ENCOUNTER — Inpatient Hospital Stay: Payer: PPO

## 2018-03-23 ENCOUNTER — Encounter: Payer: Self-pay | Admitting: Nurse Practitioner

## 2018-03-23 ENCOUNTER — Inpatient Hospital Stay: Payer: PPO | Attending: Oncology | Admitting: Nurse Practitioner

## 2018-03-23 VITALS — BP 95/73 | HR 94 | Temp 98.5°F | Resp 20 | Ht 73.0 in | Wt 181.0 lb

## 2018-03-23 DIAGNOSIS — E039 Hypothyroidism, unspecified: Secondary | ICD-10-CM | POA: Insufficient documentation

## 2018-03-23 DIAGNOSIS — I739 Peripheral vascular disease, unspecified: Secondary | ICD-10-CM | POA: Diagnosis not present

## 2018-03-23 DIAGNOSIS — J45909 Unspecified asthma, uncomplicated: Secondary | ICD-10-CM | POA: Insufficient documentation

## 2018-03-23 DIAGNOSIS — I10 Essential (primary) hypertension: Secondary | ICD-10-CM | POA: Diagnosis not present

## 2018-03-23 DIAGNOSIS — Z9221 Personal history of antineoplastic chemotherapy: Secondary | ICD-10-CM | POA: Diagnosis not present

## 2018-03-23 DIAGNOSIS — M549 Dorsalgia, unspecified: Secondary | ICD-10-CM | POA: Insufficient documentation

## 2018-03-23 DIAGNOSIS — C221 Intrahepatic bile duct carcinoma: Secondary | ICD-10-CM | POA: Insufficient documentation

## 2018-03-23 DIAGNOSIS — R634 Abnormal weight loss: Secondary | ICD-10-CM | POA: Insufficient documentation

## 2018-03-23 DIAGNOSIS — R531 Weakness: Secondary | ICD-10-CM | POA: Insufficient documentation

## 2018-03-23 DIAGNOSIS — Z5111 Encounter for antineoplastic chemotherapy: Secondary | ICD-10-CM

## 2018-03-23 DIAGNOSIS — E785 Hyperlipidemia, unspecified: Secondary | ICD-10-CM | POA: Diagnosis not present

## 2018-03-23 DIAGNOSIS — Z79899 Other long term (current) drug therapy: Secondary | ICD-10-CM | POA: Insufficient documentation

## 2018-03-23 DIAGNOSIS — D649 Anemia, unspecified: Secondary | ICD-10-CM

## 2018-03-23 DIAGNOSIS — Z923 Personal history of irradiation: Secondary | ICD-10-CM | POA: Diagnosis not present

## 2018-03-23 DIAGNOSIS — K5900 Constipation, unspecified: Secondary | ICD-10-CM | POA: Diagnosis not present

## 2018-03-23 LAB — CBC WITH DIFFERENTIAL (CANCER CENTER ONLY)
Abs Immature Granulocytes: 0.16 10*3/uL — ABNORMAL HIGH (ref 0.00–0.07)
Basophils Absolute: 0 10*3/uL (ref 0.0–0.1)
Basophils Relative: 0 %
Eosinophils Absolute: 0 10*3/uL (ref 0.0–0.5)
Eosinophils Relative: 0 %
HEMATOCRIT: 24.8 % — AB (ref 39.0–52.0)
Hemoglobin: 7.8 g/dL — ABNORMAL LOW (ref 13.0–17.0)
Immature Granulocytes: 1 %
LYMPHS ABS: 0.5 10*3/uL — AB (ref 0.7–4.0)
Lymphocytes Relative: 3 %
MCH: 26.2 pg (ref 26.0–34.0)
MCHC: 31.5 g/dL (ref 30.0–36.0)
MCV: 83.2 fL (ref 80.0–100.0)
MONOS PCT: 8 %
Monocytes Absolute: 1.4 10*3/uL — ABNORMAL HIGH (ref 0.1–1.0)
Neutro Abs: 14.8 10*3/uL — ABNORMAL HIGH (ref 1.7–7.7)
Neutrophils Relative %: 88 %
Platelet Count: 241 10*3/uL (ref 150–400)
RBC: 2.98 MIL/uL — ABNORMAL LOW (ref 4.22–5.81)
RDW: 18.1 % — ABNORMAL HIGH (ref 11.5–15.5)
WBC Count: 16.9 10*3/uL — ABNORMAL HIGH (ref 4.0–10.5)
nRBC: 0 % (ref 0.0–0.2)

## 2018-03-23 LAB — SAMPLE TO BLOOD BANK

## 2018-03-23 LAB — CMP (CANCER CENTER ONLY)
ALK PHOS: 324 U/L — AB (ref 38–126)
ALT: 60 U/L — ABNORMAL HIGH (ref 0–44)
AST: 132 U/L — ABNORMAL HIGH (ref 15–41)
Albumin: 2.3 g/dL — ABNORMAL LOW (ref 3.5–5.0)
Anion gap: 8 (ref 5–15)
BILIRUBIN TOTAL: 0.9 mg/dL (ref 0.3–1.2)
BUN: 23 mg/dL (ref 8–23)
CO2: 28 mmol/L (ref 22–32)
Calcium: 10.2 mg/dL (ref 8.9–10.3)
Chloride: 88 mmol/L — ABNORMAL LOW (ref 98–111)
Creatinine: 1.02 mg/dL (ref 0.61–1.24)
GFR, Est AFR Am: >60 mL/min (ref >60)
GFR, Estimated: >60 mL/min (ref >60)
Glucose, Bld: 99 mg/dL (ref 70–99)
Potassium: 5.1 mmol/L (ref 3.5–5.1)
Sodium: 124 mmol/L — ABNORMAL LOW (ref 135–145)
Total Protein: 7.1 g/dL (ref 6.5–8.1)

## 2018-03-23 LAB — MAGNESIUM: Magnesium: 1.7 mg/dL (ref 1.7–2.4)

## 2018-03-23 MED ORDER — OXYCODONE-ACETAMINOPHEN 5-325 MG PO TABS: 1.0000 | ORAL_TABLET | ORAL | 0 refills | Status: AC | PRN

## 2018-03-23 NOTE — Progress Notes (Addendum)
[facility] OFFICE PROGRESS NOTE   Diagnosis: Cholangiocarcinoma  INTERVAL HISTORY:   Michael Mosley returns as scheduled.  He completed another cycle of gemcitabine/cisplatin on 03/10/2018.  He has occasional nausea.  Mouth is chronically dry.  Bowels are moving though he does note some constipation.  Appetite is poor.  He continues to lose weight.  He reports he was too weak to undergo the CT scan last week.  He continues to have back pain.  He is taking one half of a Percocet tablet every 4-5 hours.  Objective:  Vital signs in last 24 hours:  Blood pressure 95/73, pulse 94, temperature 98.5 F (36.9 C), temperature source Oral, resp. rate 20, height '6\' 1"'  (1.854 m), weight 181 lb (82.1 kg), SpO2 96 %.    HEENT: Mouth is dry appearing. Resp: Lungs clear bilaterally. Cardio: Regular rate and rhythm. GI: Liver palpable right upper to mid abdomen with associated tenderness. Vascular: Trace lower leg edema bilaterally.  Calves soft and nontender.  Port-A-Cath without erythema.  Lab Results:  Lab Results  Component Value Date   WBC 16.9 (H) 03/23/2018   HGB 7.8 (L) 03/23/2018   HCT 24.8 (L) 03/23/2018   MCV 83.2 03/23/2018   PLT 241 03/23/2018   NEUTROABS 14.8 (H) 03/23/2018    Imaging:  No results found.  Medications: I have reviewed the patient's current medications.  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   CT abdomen/pelvis on 04/21/2017-7 cm mass lesion in the lateral segment of the left liver associated with smaller lesions in the anterior right liver and medial lobe left liver. This was associated with necrotic lymphadenopathy in the gastrohepatic and hepatoduodenal ligaments.  04/25/2017 status post upper endoscopy and colonoscopy.   On the upper endoscopy he was found to have 1  moderate benign-appearing intrinsic stenosis at 40 cm from the incisors. This was dilated. The exam of the esophagus was otherwise normal. The entire examined stomach and duodenum also normal.   On the colonoscopy he was found to have a 5 mm polyp in the cecum which was removed. Pathology showed a serrated polyp with features of a sessile serrated polyp; negative for cytologic dysplasia. He was noted to have many small and large mouth diverticula in the sigmoid colon and descending colon. Terminal ileum appeared normal. Internal hemorrhoids were found as well.   04/28/2017 status post biopsy of a liver lesion. Pathology showed adenocarcinoma positive for cytokeratin 7 (strong), CDX-2 (weak) and focal TTF-1. Cytokeratin 20 essentially negative.  CT chest 05/12/2017-no evidence of metastatic disease or a primary chest tumor  oundation1-microsatellite stable; tumor mutational burden 6 Muts/Mb; CDKN28 loss  Cycle 1 gemcitabine/cisplatin 07/01/2017  Cycle 2 gemcitabine/cisplatin 07/15/2017  Cycle 3 gemcitabine/cisplatin 07/29/2017  Cycle 4 gemcitabine/cisplatin 08/12/2017  Cycle 5 gemcitabine/cisplatin 08/26/2017  CT 09/15/2017- decreased size of dominant and smaller hepatic lesions, improved upper abdominal lymphadenopathy  Cycle 6 gemcitabine/cisplatin 09/16/2017  Cycle 7 gemcitabine/cisplatin 09/29/2017  Cycle 8 gemcitabine/cisplatin 10/15/2017  Cycle 9 gemcitabine/cisplatin 11/05/2017  CT abdomen/pelvis 12/15/2017-dominant mass lesion in the left lateral hepatic lobe measures slightly smaller and visually appears very similar. New round lesion in the right hepatic lobe concerning for a new focus of metastatic disease. Previous described metastatic deposit in the right hepatic lobe is decreased in size. Overall decrease in size of gastrohepatic ligament lymph nodes although one lymph node does measure slightly larger.  Cycle 10 gemcitabine/cisplatin 12/16/2017  Cycle 11  gemcitabine/cisplatin 01/06/2018 (cisplatin dose reduced secondary to a mild increase in the creatinine)  Cycle 12 gemcitabine/cisplatin 01/28/2018  Cycle 13 gemcitabine/cisplatin 02/24/2018  Cycle 14 gemcitabine/cisplatin 03/10/2018 2. Weight loss 3. 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 Alen Blew) 4. Asthma 5. Hypertension 6. Hyperlipidemia 7. Hypothyroid 8. 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  His performance status continues to decline.  He was not able to undergo the planned restaging CTs last week.  Dr. Benay Spice reviewed with Michael Mosley and his son that his symptoms are most likely due to progression of the cancer.  Michael Mosley states that he does not wish to undergo further treatment.  We discussed supportive/comfort care with a [facility] hospice referral.  He is in agreement.  We discussed CODE STATUS.  He will be placed on NO CODE BLUE status per his wishes.  He would like to proceed with a blood transfusion tomorrow.  We will make those arrangements.  He will return for a follow-up visit in 2 to 3 weeks.  We can see him sooner if needed.  Patient seen with Dr. Benay Spice.    Ned Card ANP/GNP-BC   03/23/2018  12:24 PM  This was a shared visit with Ned Card.  Michael Mosley was interviewed and examined.  His performance status has declined.  There is clinical evidence of disease progression.  We discussed treatment options with Michael Mosley and his son.  He would like to proceed with comfort care.  His performance status does not appear adequate to receive a trial of salvage systemic therapy.  He agrees to a [facility] referral.  We discussed CPR and ACLS  issues.  He will be placed on a no CODE BLUE status.  He will continue the current narcotic regimen.  Julieanne Manson, MD

## 2018-03-23 NOTE — Progress Notes (Signed)
[facility] called and faxed.

## 2018-03-24 ENCOUNTER — Telehealth: Payer: Self-pay | Admitting: *Deleted

## 2018-03-24 ENCOUNTER — Other Ambulatory Visit: Payer: Self-pay

## 2018-03-24 ENCOUNTER — Inpatient Hospital Stay: Payer: PPO

## 2018-03-24 DIAGNOSIS — D649 Anemia, unspecified: Secondary | ICD-10-CM

## 2018-03-24 DIAGNOSIS — C221 Intrahepatic bile duct carcinoma: Secondary | ICD-10-CM | POA: Diagnosis not present

## 2018-03-24 LAB — CANCER ANTIGEN 19-9: CA 19-9: 100 U/mL — ABNORMAL HIGH (ref 0–35)

## 2018-03-24 LAB — PREPARE RBC (CROSSMATCH)

## 2018-03-24 MED ORDER — SODIUM CHLORIDE 0.9% FLUSH
3.0000 mL | INTRAVENOUS | Status: AC | PRN
  Administered 2018-03-24: 10 mL
  Filled 2018-03-24: qty 10

## 2018-03-24 MED ORDER — HEPARIN SOD (PORK) LOCK FLUSH 100 UNIT/ML IV SOLN
250.0000 [IU] | INTRAVENOUS | Status: AC | PRN
  Filled 2018-03-24: qty 5

## 2018-03-24 MED ORDER — HEPARIN SOD (PORK) LOCK FLUSH 100 UNIT/ML IV SOLN
500.0000 [IU] | Freq: Every day | INTRAVENOUS | Status: AC | PRN
  Administered 2018-03-24: 500 [IU]
  Filled 2018-03-24: qty 5

## 2018-03-24 MED ORDER — PROCHLORPERAZINE EDISYLATE 10 MG/2ML IJ SOLN
INTRAMUSCULAR | Status: AC
  Filled 2018-03-24: qty 2

## 2018-03-24 MED ORDER — PROCHLORPERAZINE EDISYLATE 10 MG/2ML IJ SOLN
10.0000 mg | Freq: Once | INTRAMUSCULAR | Status: AC
  Administered 2018-03-24: 10 mg via INTRAVENOUS
  Filled 2018-03-24: qty 2

## 2018-03-24 MED ORDER — SODIUM CHLORIDE 0.9% FLUSH
10.0000 mL | INTRAVENOUS | Status: AC | PRN
  Filled 2018-03-24: qty 10

## 2018-03-24 MED ORDER — SODIUM CHLORIDE 0.9% IV SOLUTION
250.0000 mL | Freq: Once | INTRAVENOUS | Status: AC
  Administered 2018-03-24: 250 mL via INTRAVENOUS
  Filled 2018-03-24: qty 250

## 2018-03-24 NOTE — Telephone Encounter (Signed)
Notified of message below. Verbalized understanding 

## 2018-03-24 NOTE — Telephone Encounter (Signed)
-----   Message from Owens Shark, NP sent at 03/23/2018  8:49 PM EST ----- Please instruct him to discontinue cholesterol meds

## 2018-03-24 NOTE — Patient Instructions (Signed)
Blood Transfusion, Adult, Care After This sheet gives you information about how to care for yourself after your procedure. Your doctor may also give you more specific instructions. If you have problems or questions, contact your doctor. Follow these instructions at home:   Take over-the-counter and prescription medicines only as told by your doctor.  Go back to your normal activities as told by your doctor.  Follow instructions from your doctor about how to take care of the area where an IV tube was put into your vein (insertion site). Make sure you: ? Wash your hands with soap and water before you change your bandage (dressing). If there is no soap and water, use hand sanitizer. ? Change your bandage as told by your doctor.  Check your IV insertion site every day for signs of infection. Check for: ? More redness, swelling, or pain. ? More fluid or blood. ? Warmth. ? Pus or a bad smell. Contact a doctor if:  You have more redness, swelling, or pain around the IV insertion site.  You have more fluid or blood coming from the IV insertion site.  Your IV insertion site feels warm to the touch.  You have pus or a bad smell coming from the IV insertion site.  Your pee (urine) turns pink, red, or brown.  You feel weak after doing your normal activities. Get help right away if:  You have signs of a serious allergic or body defense (immune) system reaction, including: ? Itchiness. ? Hives. ? Trouble breathing. ? Anxiety. ? Pain in your chest or lower back. ? Fever, flushing, and chills. ? Fast pulse. ? Rash. ? Watery poop (diarrhea). ? Throwing up (vomiting). ? Dark pee. ? Serious headache. ? Dizziness. ? Stiff neck. ? Yellow color in your face or the white parts of your eyes (jaundice). Summary  After a blood transfusion, return to your normal activities as told by your doctor.  Every day, check for signs of infection where the IV tube was put into your vein.  Some  signs of infection are warm skin, more redness and pain, more fluid or blood, and pus or a bad smell where the needle went in.  Contact your doctor if you feel weak or have any unusual symptoms. This information is not intended to replace advice given to you by your health care provider. Make sure you discuss any questions you have with your health care provider. Document Released: 03/18/2014 Document Revised: 10/20/2015 Document Reviewed: 10/20/2015 Elsevier Interactive Patient Education  2019 [employer]  

## 2018-03-25 ENCOUNTER — Telehealth: Payer: Self-pay | Admitting: Oncology

## 2018-03-25 LAB — TYPE AND SCREEN
ABO/RH(D): O POS
Antibody Screen: NEGATIVE
Unit division: 0
Unit division: 0

## 2018-03-25 LAB — BPAM RBC
Blood Product Expiration Date: 202002122359
Blood Product Expiration Date: 202002122359
ISSUE DATE / TIME: 202001141122
ISSUE DATE / TIME: 202001141122
Unit Type and Rh: 5100
Unit Type and Rh: 5100

## 2018-03-25 NOTE — Telephone Encounter (Signed)
Scheduled appt per 1/13 los - sent reminder letter in the mail .

## 2018-03-27 ENCOUNTER — Telehealth: Payer: Self-pay | Admitting: *Deleted

## 2018-03-27 NOTE — Telephone Encounter (Signed)
Has seen patient two days in a row and he is making rapid decline. Practically bed bound except going to bathroom. Stopped eating and getting weaker. Some nausea persists--activated compazine order per standing order. Pain is less since he is not eating. Only complaint is of "sweating a lot"--trying Naproxyn 220 mg, which has helped some patients in the past. Patient states "I'm ready to go". MD notified.

## 2018-04-01 ENCOUNTER — Encounter: Payer: Self-pay | Admitting: *Deleted

## 2018-04-03 ENCOUNTER — Ambulatory Visit: Payer: PPO | Admitting: Emergency Medicine

## 2018-04-03 ENCOUNTER — Telehealth: Payer: Self-pay | Admitting: *Deleted

## 2018-04-03 ENCOUNTER — Encounter

## 2018-04-08 ENCOUNTER — Encounter: Payer: Self-pay | Admitting: Oncology

## 2018-04-10 ENCOUNTER — Ambulatory Visit: Payer: PPO | Admitting: Oncology

## 2018-04-11 NOTE — Telephone Encounter (Signed)
Message that patient died today at 1:15 pm. Family is doing well. MD notified.

## 2018-04-11 DEATH — deceased

## 2019-09-06 IMAGING — CR DG THORACIC SPINE 4+V
3 series · 3 of 3 positions shown · non-contrast
Comparison: None.

CLINICAL DATA: Mid back pain.  Evaluate for metastatic lesion.

EXAM:
THORACIC SPINE - 4+ VIEW

[t thoracic spine ap]
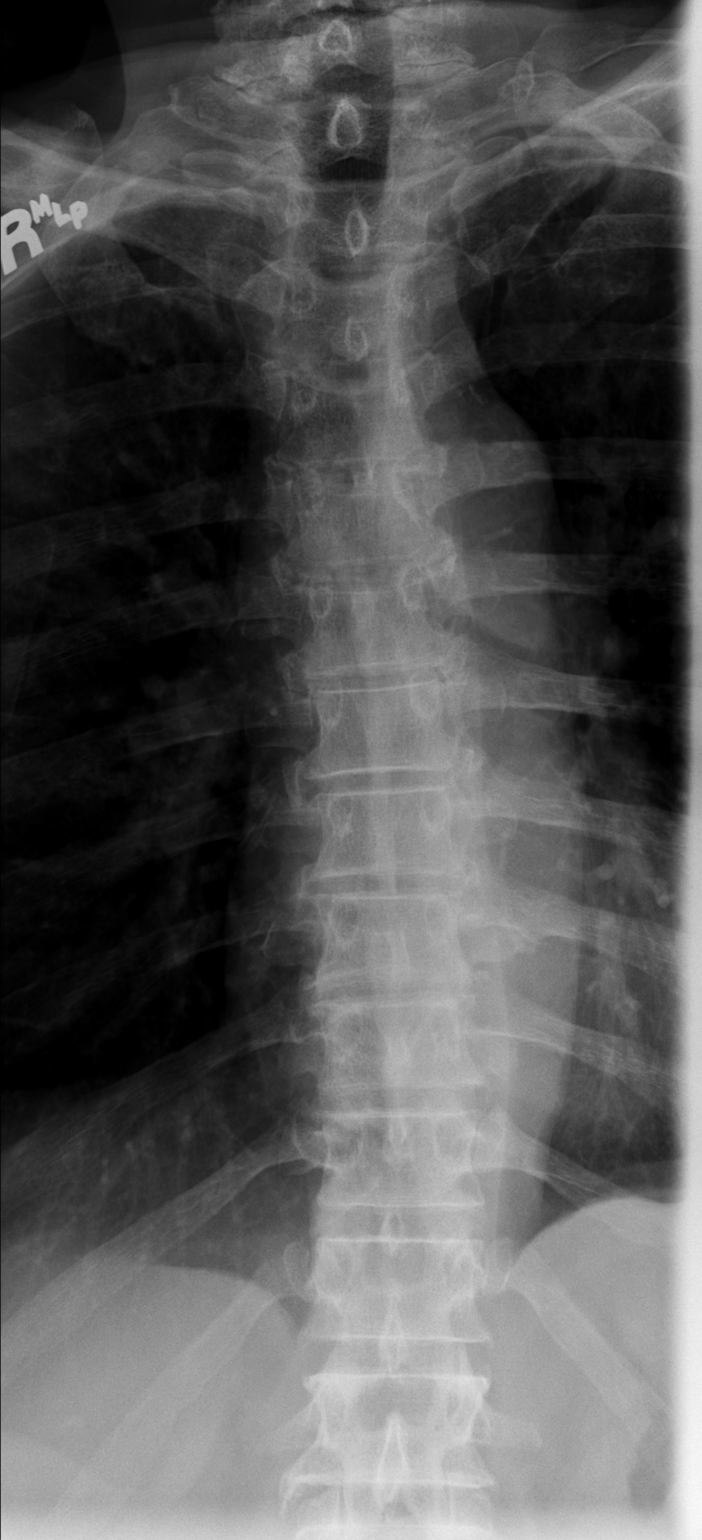

[t thoracic spine lat]
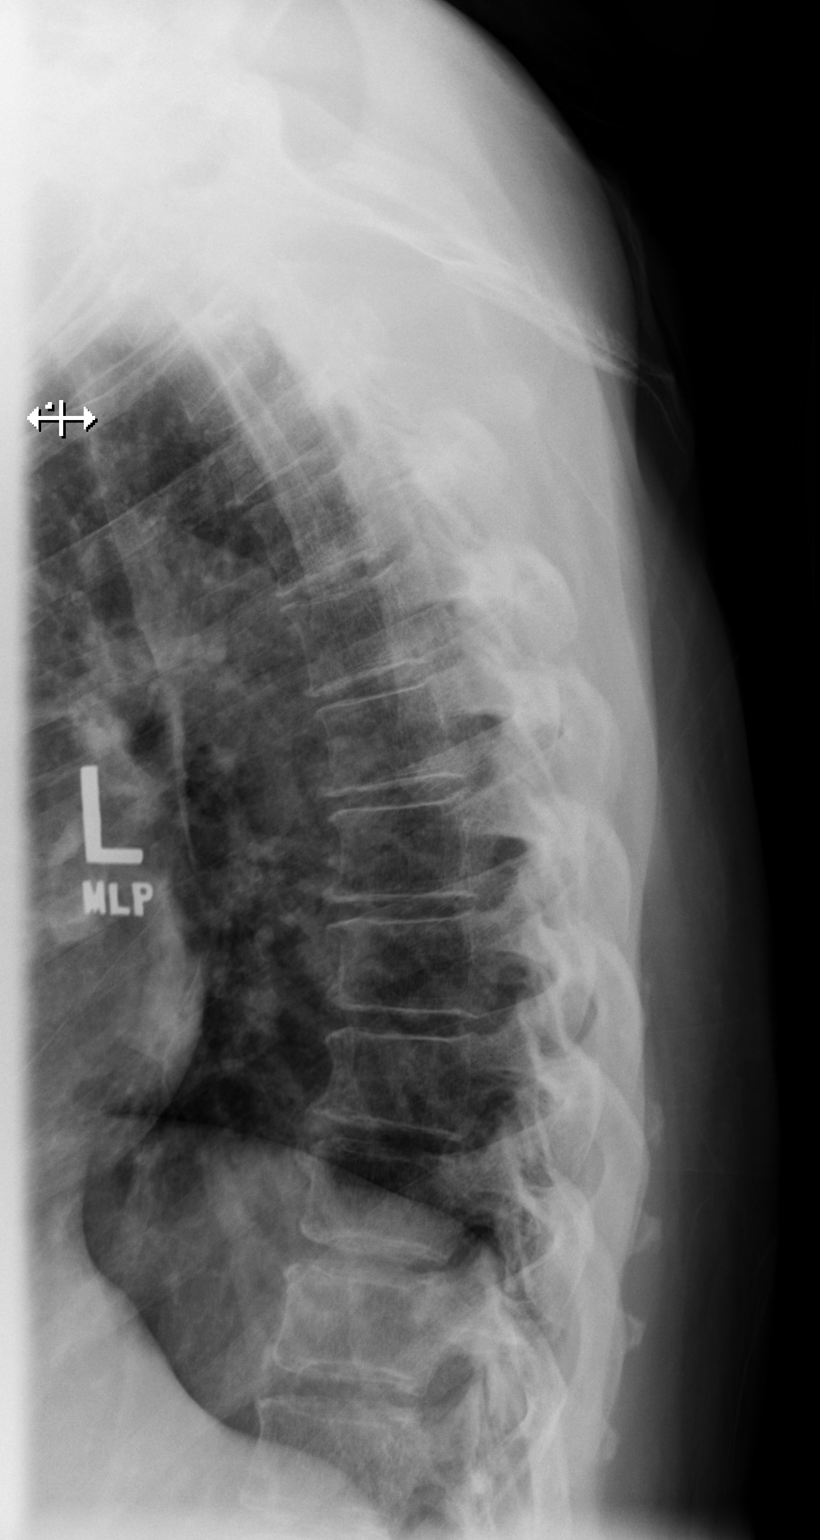

[t thoracic swimmers]
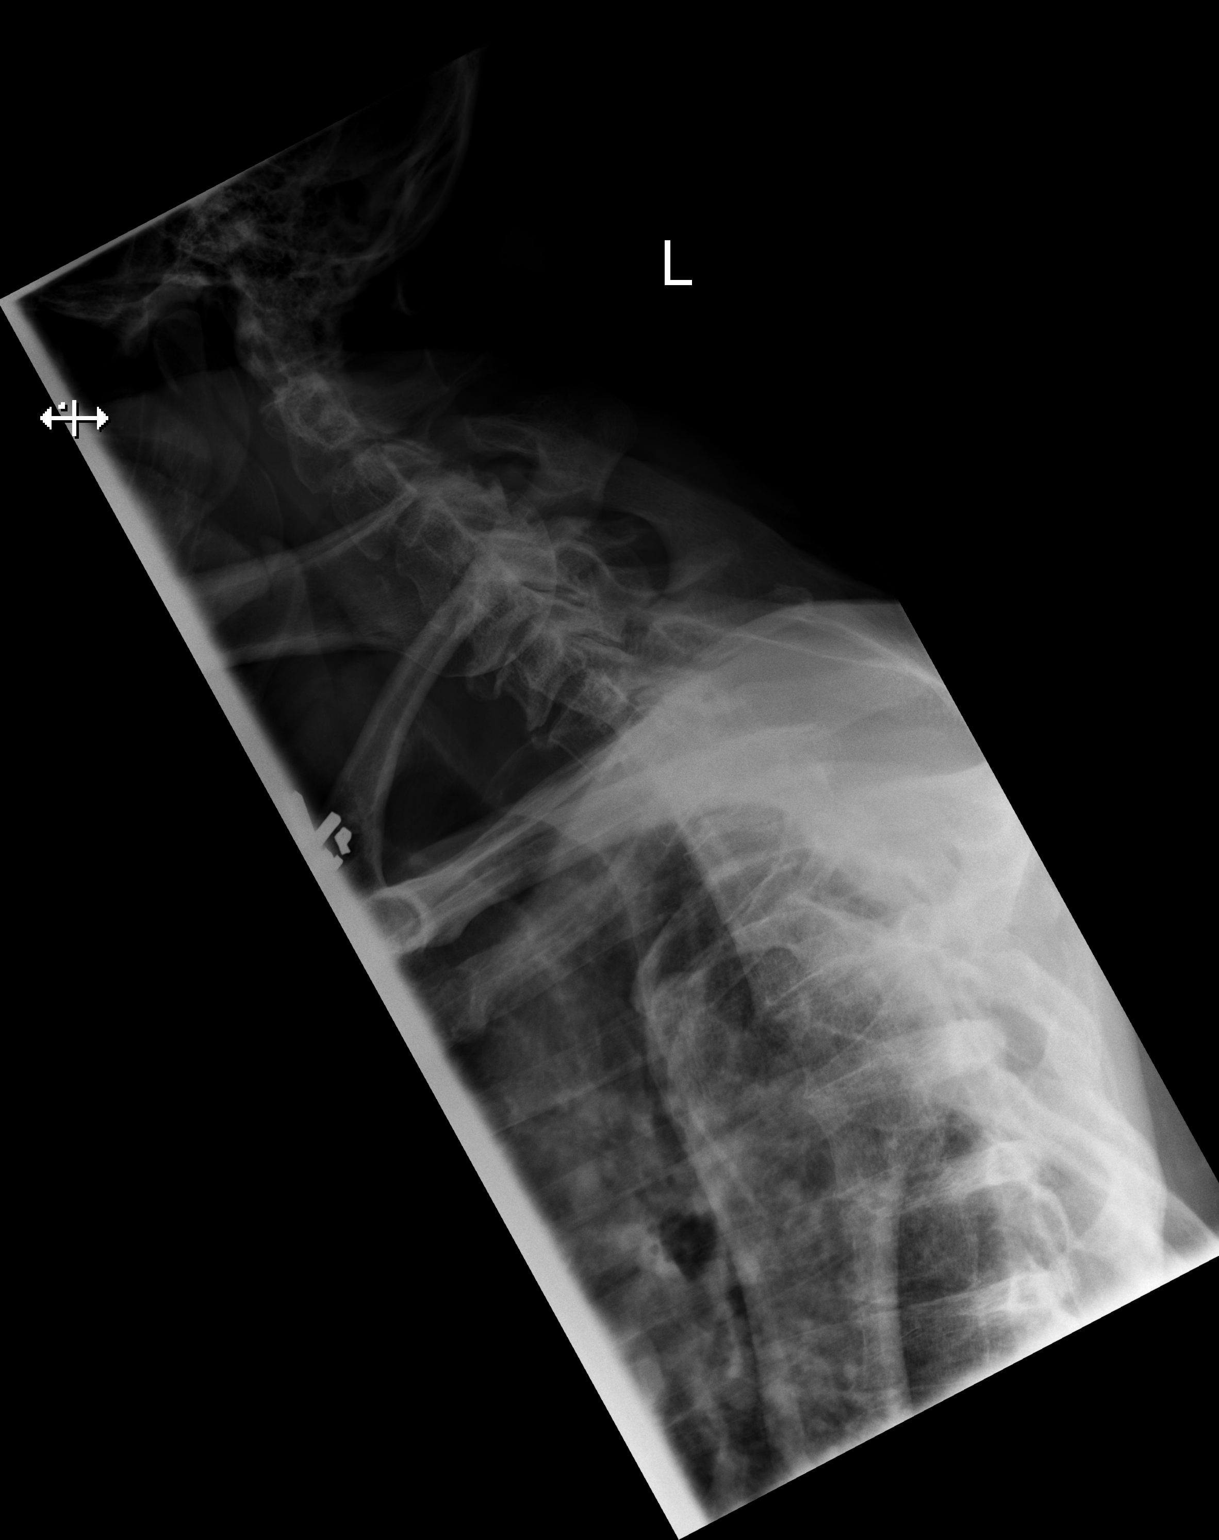

[3 of 3 positions shown; findings below may reference images not displayed]

FINDINGS: Mild degenerative spurring in the mid and lower thoracic spine. No
focal bone lesion. No fracture or malalignment.
IMPRESSION: No acute bony abnormality.

## 2019-09-06 IMAGING — CT CT ABD-PELV W/ CM
2 of 5 series · 16 of 46 positions shown, 18 images · IV contrast (ISOVUE)
Comparison: 04/21/2017

CLINICAL DATA: Back pain, newly diagnosed liver cancer

EXAM:
CT ABDOMEN AND PELVIS WITH CONTRAST
TECHNIQUE: Multidetector CT imaging of the abdomen and pelvis was performed
using the standard protocol following bolus administration of
intravenous contrast.
CONTRAST:  100mL OQ6O4Z-TAA IOPAMIDOL (OQ6O4Z-TAA) INJECTION 61%

[Series 2: axial st · axial · 0.88mm/px · z∈[+982,+1397]mm · 13 of 97 slices shown, 15 images]
[im 7/97  soft-tissue]
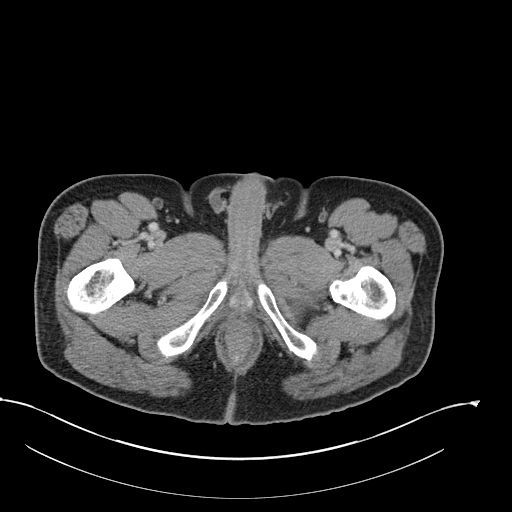
[im 7/97  bone]
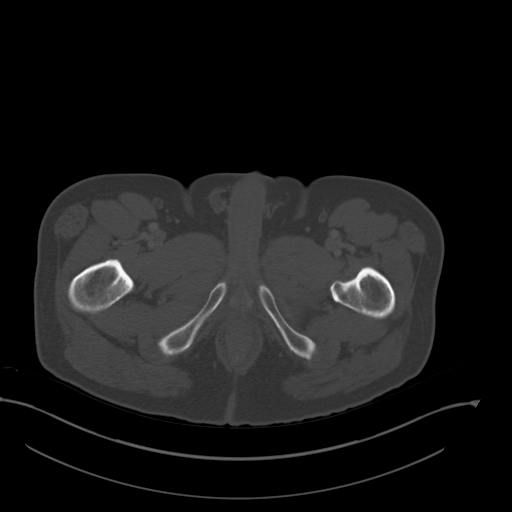
[im 13/97  soft-tissue]
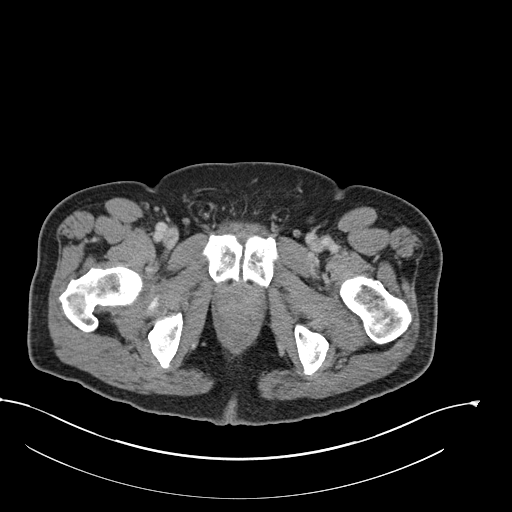
[im 20/97  soft-tissue]
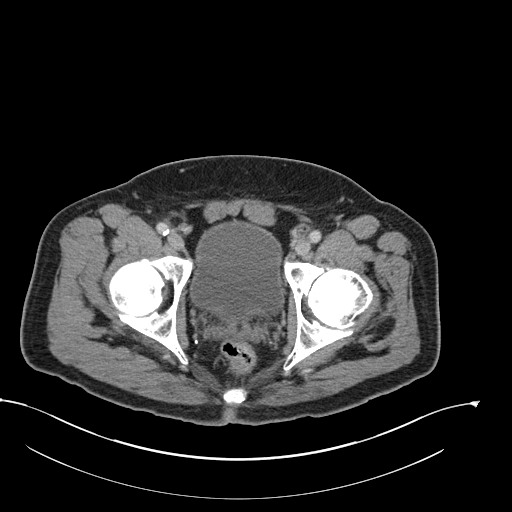
[im 26/97  soft-tissue]
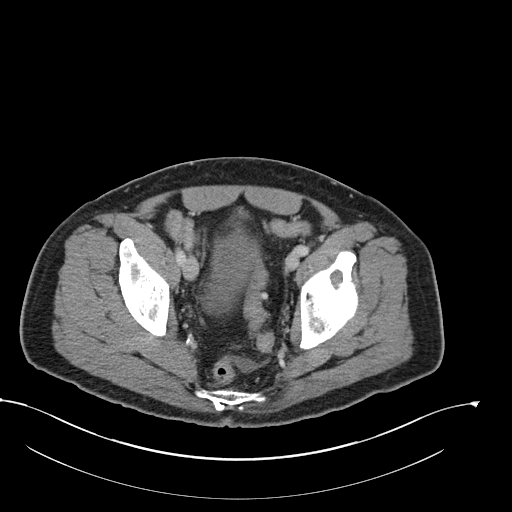
[im 33/97  soft-tissue]
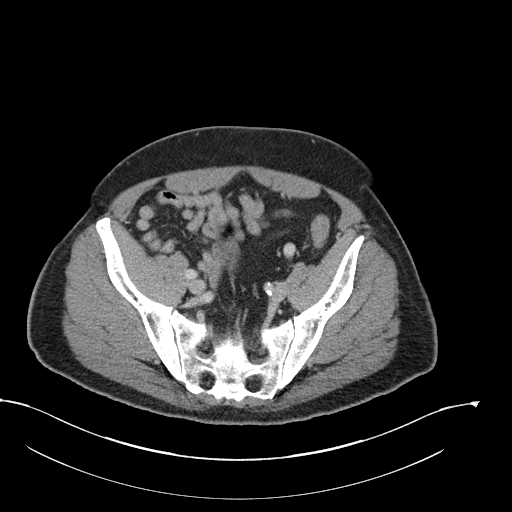
[im 39/97  soft-tissue]
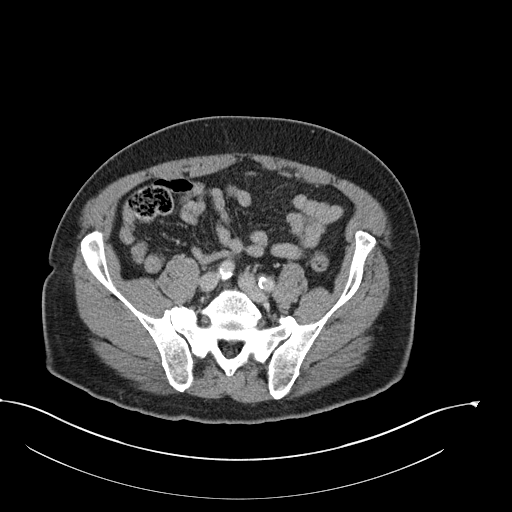
[im 52/97  soft-tissue]
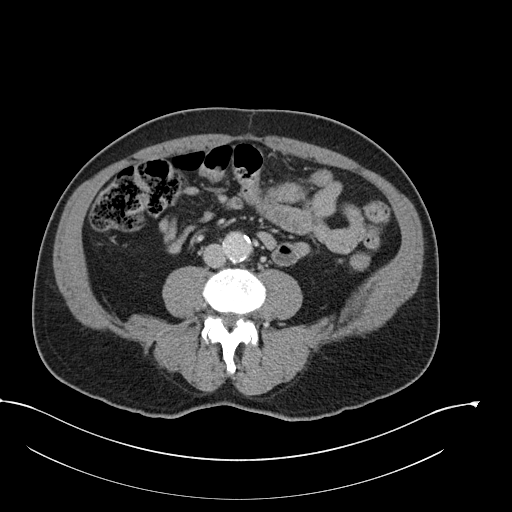
[im 58/97  soft-tissue]
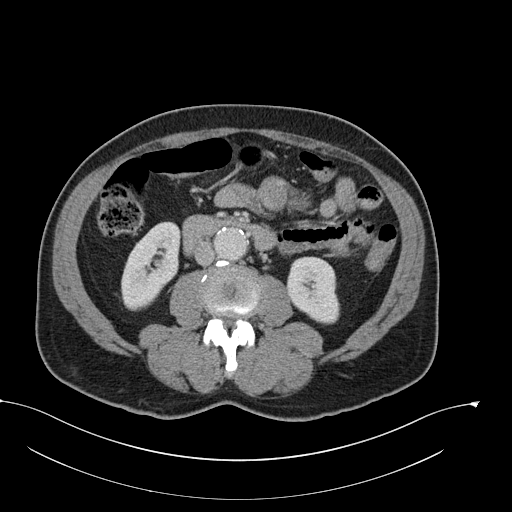
[im 65/97  soft-tissue]
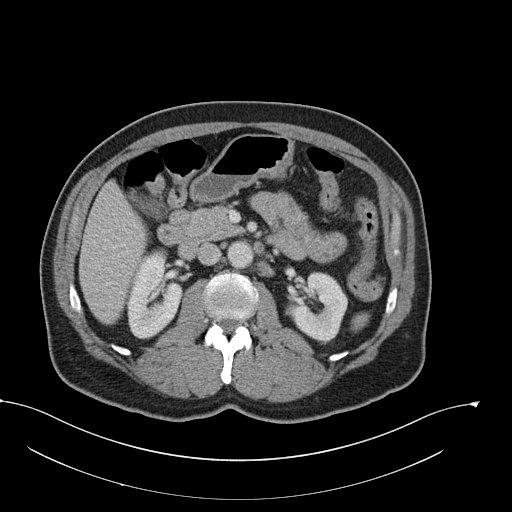
[im 65/97  bone]
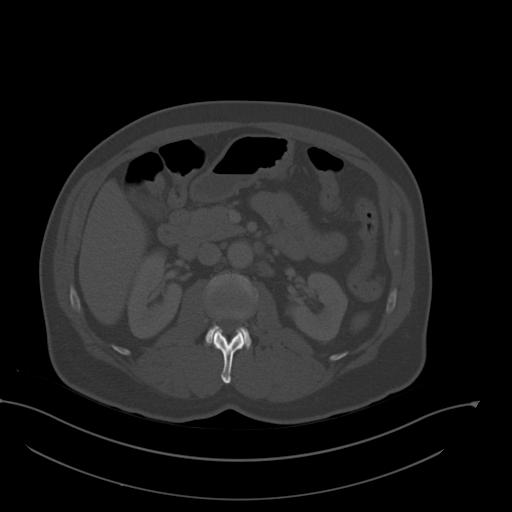
[im 71/97  soft-tissue]
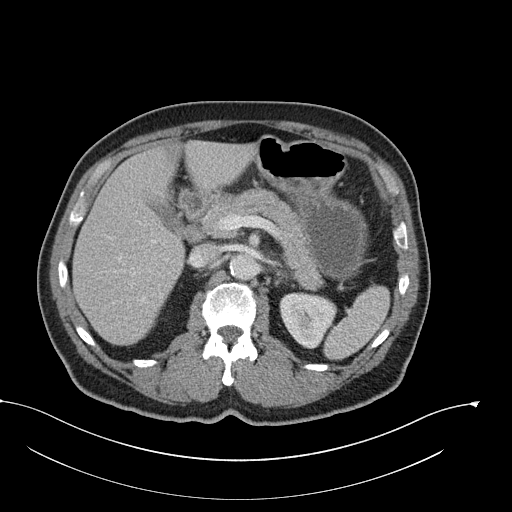
[im 77/97  soft-tissue]
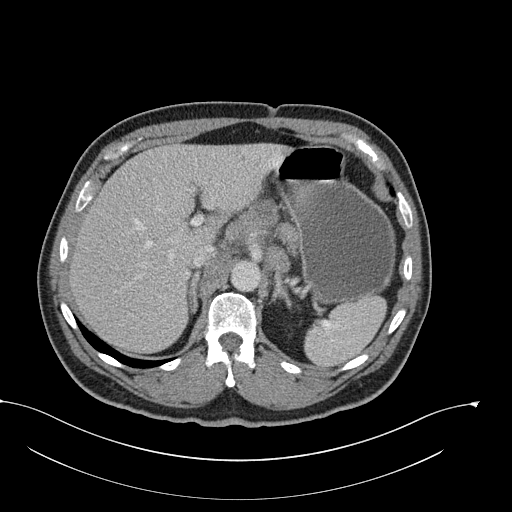
[im 84/97  soft-tissue]
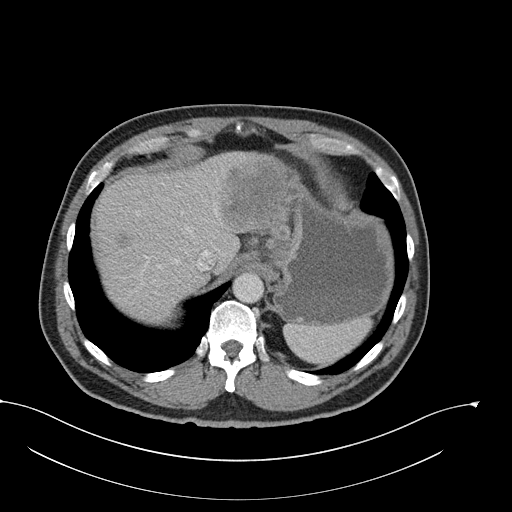
[im 90/97  soft-tissue]
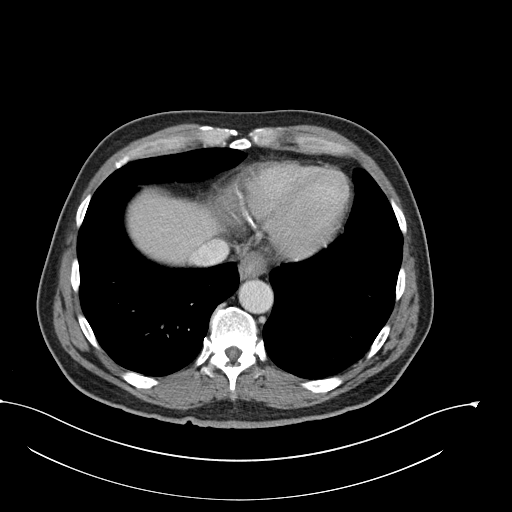

[Series 5: coronal st · coronal · 0.92mm/px · 3 of 101 slices shown]
[im 34/101  soft-tissue]
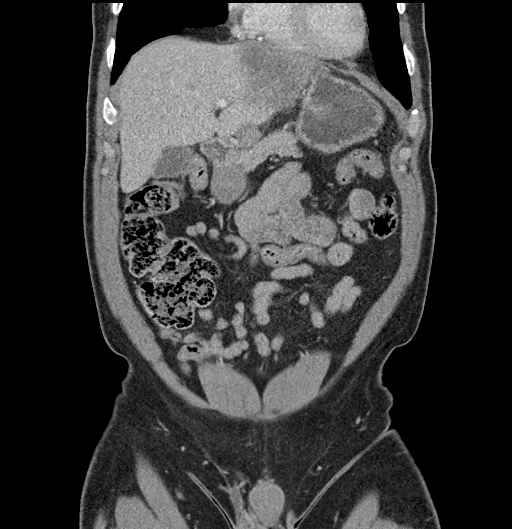
[im 45/101  soft-tissue]
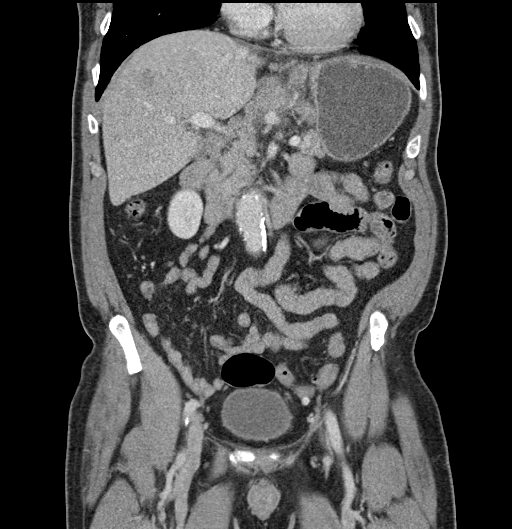
[im 56/101  soft-tissue]
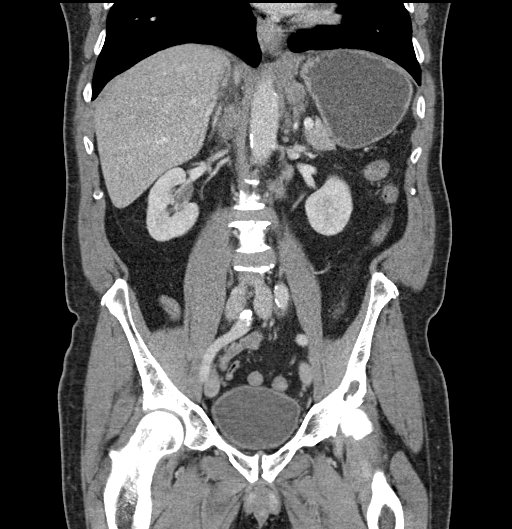

[16 of 46 positions shown; findings below may reference images not displayed]

FINDINGS: Lower chest: Lung bases are clear.

Hepatobiliary: Dominant 6.9 cm mass in segment 2 of the liver
(series 2/image 13), grossly unchanged.

Three additional hepatic metastases measuring up to 2.0 cm in
segment 8 (series 2/image 15), previously 1.2 cm.

Focal fat/altered perfusion along the falciform ligament (series
2/image 24). Additional probable focal fat along the gallbladder
fossa (series 3/image 31), although indeterminate.

Gallbladder is unremarkable. No intrahepatic or extrahepatic ductal
dilatation.

Pancreas: Within normal limits. No pancreatic atrophy or ductal
dilatation.

Spleen: Within normal limits.

Adrenals/Urinary Tract: Adrenal glands are within normal limits.

Kidneys are within normal limits.  No hydronephrosis.

Bladder is within normal limits.

Stomach/Bowel: Stomach is grossly unremarkable.

No evidence of bowel obstruction.

No colonic wall thickening or mass is evident on CT.

Left colon is decompressed.

Vascular/Lymphatic: No evidence of abdominal aortic aneurysm.

Atherosclerotic calcifications of the abdominal aorta and branch
vessels.

Extensive upper abdominal/retroperitoneal lymphadenopathy, including
a dominant 2.7 cm short axis gastrohepatic node (series 2/image 18),
previously 2.5 cm.

No suspicious pelvic lymphadenopathy.

Reproductive: Prostate is grossly unremarkable.

Other: Trace pelvic ascites.

Tiny fat containing right inguinal hernia.

Musculoskeletal: Mild degenerative changes of the visualized
thoracolumbar spine.

No focal osseous lesions.  No fracture is seen.
IMPRESSION: Dominant 6.9 cm mass in segment 2 of the liver is grossly unchanged,
corresponding to the patient's known primary hepatic malignancy,
possibly intrahepatic cholangiocarcinoma.

Mild progression of additional hepatic metastases, as described
above.

Mild progression of upper abdominal/retroperitoneal nodal
metastases, as described above.

No focal osseous lesions.  No fracture is seen.
